# Patient Record
Sex: Female | Born: 1980 | Race: White | Hispanic: No | Marital: Married | Smoking: Former smoker
Health system: Southern US, Community
[De-identification: ages and names within clinical notes are randomized; demographics above are authoritative.]

## PROBLEM LIST (undated history)

## (undated) DIAGNOSIS — F329 Major depressive disorder, single episode, unspecified: Secondary | ICD-10-CM

## (undated) DIAGNOSIS — O1490 Unspecified pre-eclampsia, unspecified trimester: Secondary | ICD-10-CM

## (undated) DIAGNOSIS — G43909 Migraine, unspecified, not intractable, without status migrainosus: Secondary | ICD-10-CM

## (undated) DIAGNOSIS — R569 Unspecified convulsions: Secondary | ICD-10-CM

## (undated) DIAGNOSIS — Z8742 Personal history of other diseases of the female genital tract: Secondary | ICD-10-CM

## (undated) DIAGNOSIS — K219 Gastro-esophageal reflux disease without esophagitis: Secondary | ICD-10-CM

## (undated) DIAGNOSIS — F32A Depression, unspecified: Secondary | ICD-10-CM

## (undated) DIAGNOSIS — F419 Anxiety disorder, unspecified: Secondary | ICD-10-CM

## (undated) DIAGNOSIS — N809 Endometriosis, unspecified: Secondary | ICD-10-CM

## (undated) DIAGNOSIS — E785 Hyperlipidemia, unspecified: Secondary | ICD-10-CM

## (undated) HISTORY — DX: Personal history of other diseases of the female genital tract: Z87.42

## (undated) HISTORY — DX: Major depressive disorder, single episode, unspecified: F32.9

## (undated) HISTORY — DX: Anxiety disorder, unspecified: F41.9

## (undated) HISTORY — PX: HAND SURGERY: SHX662

## (undated) HISTORY — DX: Gastro-esophageal reflux disease without esophagitis: K21.9

## (undated) HISTORY — DX: Depression, unspecified: F32.A

## (undated) HISTORY — PX: OTHER SURGICAL HISTORY: SHX169

## (undated) HISTORY — DX: Unspecified convulsions: R56.9

## (undated) HISTORY — PX: ECTOPIC PREGNANCY SURGERY: SHX613

## (undated) HISTORY — PX: CERVICAL BIOPSY  W/ LOOP ELECTRODE EXCISION: SUR135

## (undated) HISTORY — PX: LEEP: SHX91

## (undated) HISTORY — DX: Hyperlipidemia, unspecified: E78.5

## (undated) HISTORY — DX: Endometriosis, unspecified: N80.9

## (undated) HISTORY — PX: ABDOMINAL HYSTERECTOMY: SHX81

---

## 2015-07-18 DIAGNOSIS — Z22338 Carrier of other streptococcus: Secondary | ICD-10-CM | POA: Diagnosis not present

## 2015-07-18 DIAGNOSIS — G43119 Migraine with aura, intractable, without status migrainosus: Secondary | ICD-10-CM | POA: Diagnosis not present

## 2015-07-18 DIAGNOSIS — J0100 Acute maxillary sinusitis, unspecified: Secondary | ICD-10-CM | POA: Diagnosis not present

## 2015-08-13 DIAGNOSIS — D72829 Elevated white blood cell count, unspecified: Secondary | ICD-10-CM | POA: Diagnosis not present

## 2015-08-13 DIAGNOSIS — R1031 Right lower quadrant pain: Secondary | ICD-10-CM | POA: Diagnosis not present

## 2015-08-13 DIAGNOSIS — R198 Other specified symptoms and signs involving the digestive system and abdomen: Secondary | ICD-10-CM | POA: Diagnosis not present

## 2015-08-13 DIAGNOSIS — R197 Diarrhea, unspecified: Secondary | ICD-10-CM | POA: Diagnosis not present

## 2015-08-27 DIAGNOSIS — G5632 Lesion of radial nerve, left upper limb: Secondary | ICD-10-CM | POA: Diagnosis not present

## 2015-08-27 DIAGNOSIS — M7712 Lateral epicondylitis, left elbow: Secondary | ICD-10-CM | POA: Diagnosis not present

## 2015-08-30 DIAGNOSIS — Z9889 Other specified postprocedural states: Secondary | ICD-10-CM | POA: Diagnosis not present

## 2015-08-30 DIAGNOSIS — Z01419 Encounter for gynecological examination (general) (routine) without abnormal findings: Secondary | ICD-10-CM | POA: Diagnosis not present

## 2015-08-30 DIAGNOSIS — Z1151 Encounter for screening for human papillomavirus (HPV): Secondary | ICD-10-CM | POA: Diagnosis not present

## 2015-08-30 DIAGNOSIS — N939 Abnormal uterine and vaginal bleeding, unspecified: Secondary | ICD-10-CM | POA: Diagnosis not present

## 2015-09-05 DIAGNOSIS — M7712 Lateral epicondylitis, left elbow: Secondary | ICD-10-CM | POA: Diagnosis not present

## 2015-09-05 DIAGNOSIS — G5632 Lesion of radial nerve, left upper limb: Secondary | ICD-10-CM | POA: Diagnosis not present

## 2016-02-04 DIAGNOSIS — Z23 Encounter for immunization: Secondary | ICD-10-CM | POA: Diagnosis not present

## 2016-02-04 DIAGNOSIS — F329 Major depressive disorder, single episode, unspecified: Secondary | ICD-10-CM | POA: Diagnosis not present

## 2016-02-04 DIAGNOSIS — G43709 Chronic migraine without aura, not intractable, without status migrainosus: Secondary | ICD-10-CM | POA: Diagnosis not present

## 2016-02-04 DIAGNOSIS — F411 Generalized anxiety disorder: Secondary | ICD-10-CM | POA: Diagnosis not present

## 2016-02-04 DIAGNOSIS — F32A Depression, unspecified: Secondary | ICD-10-CM | POA: Insufficient documentation

## 2016-02-04 DIAGNOSIS — E559 Vitamin D deficiency, unspecified: Secondary | ICD-10-CM | POA: Insufficient documentation

## 2016-03-25 DIAGNOSIS — R8290 Unspecified abnormal findings in urine: Secondary | ICD-10-CM | POA: Diagnosis not present

## 2016-04-19 DIAGNOSIS — J111 Influenza due to unidentified influenza virus with other respiratory manifestations: Secondary | ICD-10-CM | POA: Diagnosis not present

## 2016-05-12 ENCOUNTER — Emergency Department: Payer: BLUE CROSS/BLUE SHIELD

## 2016-05-12 ENCOUNTER — Encounter: Payer: Self-pay | Admitting: Emergency Medicine

## 2016-05-12 ENCOUNTER — Emergency Department
Admission: EM | Admit: 2016-05-12 | Discharge: 2016-05-12 | Disposition: A | Discharge status: Home / Self Care | Payer: BLUE CROSS/BLUE SHIELD | Attending: Emergency Medicine | Admitting: Emergency Medicine

## 2016-05-12 DIAGNOSIS — J181 Lobar pneumonia, unspecified organism: Secondary | ICD-10-CM | POA: Diagnosis not present

## 2016-05-12 DIAGNOSIS — R05 Cough: Secondary | ICD-10-CM | POA: Diagnosis not present

## 2016-05-12 DIAGNOSIS — R0602 Shortness of breath: Secondary | ICD-10-CM | POA: Diagnosis not present

## 2016-05-12 DIAGNOSIS — J189 Pneumonia, unspecified organism: Secondary | ICD-10-CM

## 2016-05-12 MED ORDER — ACETAMINOPHEN 325 MG PO TABS
650.0000 mg | ORAL_TABLET | Freq: Once | ORAL | Status: AC
  Administered 2016-05-12: 650 mg via ORAL

## 2016-05-12 MED ORDER — BENZONATATE 100 MG PO CAPS
200.0000 mg | ORAL_CAPSULE | Freq: Once | ORAL | Status: AC
  Administered 2016-05-12: 200 mg via ORAL
  Filled 2016-05-12: qty 2

## 2016-05-12 MED ORDER — HYDROCOD POLST-CPM POLST ER 10-8 MG/5ML PO SUER: 5.0000 mL | Freq: Two times a day (BID) | ORAL | 0 refills | Status: DC

## 2016-05-12 MED ORDER — SULFAMETHOXAZOLE-TRIMETHOPRIM 800-160 MG PO TABS: 1.0000 | ORAL_TABLET | Freq: Two times a day (BID) | ORAL | 0 refills | Status: DC

## 2016-05-12 MED ORDER — METHYLPREDNISOLONE 4 MG PO TBPK: ORAL_TABLET | ORAL | 0 refills | Status: DC

## 2016-05-12 MED ORDER — HYDROMORPHONE HCL 1 MG/ML IJ SOLN
1.0000 mg | Freq: Once | INTRAMUSCULAR | Status: AC
  Administered 2016-05-12: 1 mg via INTRAMUSCULAR
  Filled 2016-05-12: qty 1

## 2016-05-12 MED ORDER — METHYLPREDNISOLONE SODIUM SUCC 125 MG IJ SOLR
125.0000 mg | Freq: Once | INTRAMUSCULAR | Status: AC
  Administered 2016-05-12: 125 mg via INTRAMUSCULAR
  Filled 2016-05-12: qty 2

## 2016-05-12 MED ORDER — BENZONATATE 100 MG PO CAPS
ORAL_CAPSULE | ORAL | Status: AC
  Filled 2016-05-12: qty 1

## 2016-05-12 MED ORDER — ACETAMINOPHEN 325 MG PO TABS
ORAL_TABLET | ORAL | Status: AC
  Administered 2016-05-12: 650 mg via ORAL
  Filled 2016-05-12: qty 2

## 2016-05-12 NOTE — ED Notes (Signed)
Patient reports cough and headache began this AM.

## 2016-05-12 NOTE — ED Provider Notes (Signed)
[facility] Emergency Department Provider Note   ____________________________________________   First MD Initiated Contact with Patient 05/12/16 1404     (approximate)  I have reviewed the triage vital signs and the nursing notes.   HISTORY  Chief Complaint Cough    HPI Caitlyn Harris is a 36 y.o. female patient complaining of cough, shortness of breath, and upper back pain for 1 day. Patient had fever this morning but took ibuprofen. No other palliative measures taken for her complaint. Patient rates the pain as a 10 over 10. Patient describes the pain as "sharps/tight".   History reviewed. No pertinent past medical history.  There are no active problems to display for this patient.   Past Surgical History:  Procedure Laterality Date  . ABDOMINAL HYSTERECTOMY      Prior to Admission medications   Medication Sig Start Date End Date Taking? Authorizing Provider  chlorpheniramine-HYDROcodone (TUSSIONEX PENNKINETIC ER) 10-8 MG/5ML SUER Take 5 mLs by mouth 2 (two) times daily. 05/12/16   Sable Feil, PA-C  methylPREDNISolone (MEDROL DOSEPAK) 4 MG TBPK tablet Take Tapered dose as directed 05/12/16   Sable Feil, PA-C  sulfamethoxazole-trimethoprim (BACTRIM DS,SEPTRA DS) 800-160 MG tablet Take 1 tablet by mouth 2 (two) times daily. 05/12/16   Sable Feil, PA-C    Allergies Patient has no known allergies.  No family history on file.  Social History Social History  Substance Use Topics  . Smoking status: Not on file  . Smokeless tobacco: Not on file  . Alcohol use Not on file    Review of Systems Constitutional: Fever and chills Eyes: No visual changes. ENT: No sore throat. Cardiovascular: Denies chest pain. Respiratory: States shortness of breath and nonproductive cough. Gastrointestinal: No abdominal pain.  No nausea, no vomiting.  No diarrhea.  No constipation. Genitourinary: Negative for dysuria. Musculoskeletal: Negative for  back pain. Skin: Negative for rash. Neurological: Negative for headaches, focal weakness or numbness.    ____________________________________________   PHYSICAL EXAM:  VITAL SIGNS: ED Triage Vitals  Enc Vitals Group     BP 05/12/16 1338 107/70     Pulse Rate 05/12/16 1338 (!) 120     Resp 05/12/16 1338 20     Temp 05/12/16 1338 99.2 F (37.3 C)     Temp Source 05/12/16 1338 Oral     SpO2 05/12/16 1338 96 %     Weight 05/12/16 1339 256 lb (116.1 kg)     Height 05/12/16 1339 6\' 1"  (1.854 m)     Head Circumference --      Peak Flow --      Pain Score 05/12/16 1340 8     Pain Loc --      Pain Edu? --      Excl. in [place]? --     Constitutional: Alert and oriented. Well appearing and in no acute distress. Morbid obesity and febrile. Eyes: Conjunctivae are normal. PERRL. EOMI. Head: Atraumatic. Nose: No congestion/rhinnorhea. Mouth/Throat: Mucous membranes are moist.  Oropharynx non-erythematous. Neck: No stridor.  No cervical spine tenderness to palpation. Hematological/Lymphatic/Immunilogical: No cervical lymphadenopathy. Cardiovascular:Tachycardic. Grossly normal heart sounds.  Good peripheral circulation. Respiratory: Normal respiratory effort.  No retractions. Lungs with left Rhonchi breath sound. Gastrointestinal: Soft and nontender. No distention. No abdominal bruits. No CVA tenderness. Musculoskeletal: No lower extremity tenderness nor edema.  No joint effusions. Neurologic:  Normal speech and language. No gross focal neurologic deficits are appreciated. No gait instability. Skin:  Skin is warm, dry and  intact. No rash noted. Psychiatric: Mood and affect are normal. Speech and behavior are normal.  ____________________________________________   LABS (all labs ordered are listed, but only abnormal results are displayed)  Labs Reviewed - No data to  display ____________________________________________  EKG   ____________________________________________  RADIOLOGY  X-ray findings consistent with a left lower mass consistent with  pneumonia. ____________________________________________   PROCEDURES  Procedure(s) performed: None  Procedures  Critical Care performed: No  ____________________________________________   INITIAL IMPRESSION / ASSESSMENT AND PLAN / ED COURSE  Pertinent labs & imaging results that were available during my care of the patient were reviewed by me and considered in my medical decision making (see chart for details).  Left lower lobe pneumonia. Advised patient that there is  history 0.5 cm masslike lesion in this area having lungs re-x-rayed in 2 weeks. Patient will start a course of antibiotics. Patient given a work note for 2 days.      ____________________________________________   FINAL CLINICAL IMPRESSION(S) / ED DIAGNOSES  Final diagnoses:  Community acquired pneumonia of left lower lobe of lung ([place])      NEW MEDICATIONS STARTED DURING THIS VISIT:  New Prescriptions   CHLORPHENIRAMINE-HYDROCODONE (TUSSIONEX PENNKINETIC ER) 10-8 MG/5ML SUER    Take 5 mLs by mouth 2 (two) times daily.   METHYLPREDNISOLONE (MEDROL DOSEPAK) 4 MG TBPK TABLET    Take Tapered dose as directed   SULFAMETHOXAZOLE-TRIMETHOPRIM (BACTRIM DS,SEPTRA DS) 800-160 MG TABLET    Take 1 tablet by mouth 2 (two) times daily.     Note:  This document was prepared using Dragon voice recognition software and may include unintentional dictation errors.    Sable Feil, PA-C 05/12/16 1445    Earleen Newport, MD 05/12/16 [phone]

## 2016-05-12 NOTE — ED Triage Notes (Signed)
Cough, shortness of breath and back pain. Fever this am, took  Ibuprofen for fever.

## 2016-05-19 DIAGNOSIS — J181 Lobar pneumonia, unspecified organism: Secondary | ICD-10-CM | POA: Diagnosis not present

## 2016-06-30 DIAGNOSIS — G43109 Migraine with aura, not intractable, without status migrainosus: Secondary | ICD-10-CM | POA: Diagnosis not present

## 2016-07-11 DIAGNOSIS — J069 Acute upper respiratory infection, unspecified: Secondary | ICD-10-CM | POA: Diagnosis not present

## 2016-07-14 DIAGNOSIS — H1032 Unspecified acute conjunctivitis, left eye: Secondary | ICD-10-CM | POA: Diagnosis not present

## 2016-07-15 ENCOUNTER — Emergency Department
Admission: EM | Admit: 2016-07-15 | Discharge: 2016-07-15 | Disposition: A | Discharge status: Home / Self Care | Payer: BLUE CROSS/BLUE SHIELD | Attending: Emergency Medicine | Admitting: Emergency Medicine

## 2016-07-15 ENCOUNTER — Encounter: Payer: Self-pay | Admitting: Emergency Medicine

## 2016-07-15 DIAGNOSIS — H9201 Otalgia, right ear: Secondary | ICD-10-CM

## 2016-07-15 DIAGNOSIS — H66001 Acute suppurative otitis media without spontaneous rupture of ear drum, right ear: Secondary | ICD-10-CM | POA: Insufficient documentation

## 2016-07-15 HISTORY — DX: Migraine, unspecified, not intractable, without status migrainosus: G43.909

## 2016-07-15 MED ORDER — TRAMADOL HCL 50 MG PO TABS: 50.0000 mg | ORAL_TABLET | Freq: Four times a day (QID) | ORAL | 0 refills | Status: DC | PRN

## 2016-07-15 MED ORDER — AMOXICILLIN 500 MG PO CAPS
1000.0000 mg | ORAL_CAPSULE | Freq: Once | ORAL | Status: AC
  Administered 2016-07-15: 1000 mg via ORAL
  Filled 2016-07-15: qty 2

## 2016-07-15 MED ORDER — AMOXICILLIN 875 MG PO TABS: 875.0000 mg | ORAL_TABLET | Freq: Two times a day (BID) | ORAL | 0 refills | Status: AC

## 2016-07-15 MED ORDER — LIDOCAINE HCL (PF) 1 % IJ SOLN
2.0000 mL | Freq: Once | INTRAMUSCULAR | Status: AC
  Administered 2016-07-15: 2 mL
  Filled 2016-07-15: qty 5

## 2016-07-15 NOTE — ED Triage Notes (Signed)
Patient ambulatory to triage with steady gait, without difficulty or distress noted; pt reports recent cold symptoms, now with right earache

## 2016-07-15 NOTE — ED Notes (Signed)
Pt with c/o right ear pain, right throat pain with sore throat and trouble swallowing. Pt stated she is coughing up yellow/brown mucous, pt with hx of sinuses and allergies. Pt has 2 sons at home with ear infections, 1 with bil tubes in his ears. Pt takes zyrtec and flonase at home. Pt states a fever of 100 a couple days ago.

## 2016-07-15 NOTE — Discharge Instructions (Signed)
Please follow up with your primary care physician.

## 2016-07-15 NOTE — ED Provider Notes (Signed)
[facility] Emergency Department Provider Note   ____________________________________________   First MD Initiated Contact with Patient 07/15/16 [phone]     (approximate)  I have reviewed the triage vital signs and the nursing notes.   HISTORY  Chief Complaint Otalgia    HPI JENAVIEVE FREDA is a 36 y.o. female who comes into the hospital today with some right-sided ear pain. The patient has had some congestion and upper respiratory infection over the last few days. She saw her doctor yesterday and was given drops for pinkeye. She reports that around midnight she developed some right-sided ear pain. She's taken ibuprofen and Tylenol as well as use hot and cold compresses without any relief to her pain. The patient reports that the pain is so severe. She did have a fever to 101 a few days ago. She reports that her pain as a 7 out of 10 in intensity. The patient is unsure she's had fevers today because she's taken ibuprofen for her pain. She was seen by her doctor on Friday and given cetirizine and Flonase as well. The patient is here today for evaluation of this pain.   Past Medical History:  Diagnosis Date  . Migraine     There are no active problems to display for this patient.   Past Surgical History:  Procedure Laterality Date  . ABDOMINAL HYSTERECTOMY    . LEEP      Prior to Admission medications   Medication Sig Start Date End Date Taking? Authorizing Provider  amoxicillin (AMOXIL) 875 MG tablet Take 1 tablet (875 mg total) by mouth 2 (two) times daily. 07/15/16 07/25/16  Loney Hering, MD  chlorpheniramine-HYDROcodone (TUSSIONEX PENNKINETIC ER) 10-8 MG/5ML SUER Take 5 mLs by mouth 2 (two) times daily. 05/12/16   Sable Feil, PA-C  methylPREDNISolone (MEDROL DOSEPAK) 4 MG TBPK tablet Take Tapered dose as directed 05/12/16   Sable Feil, PA-C  sulfamethoxazole-trimethoprim (BACTRIM DS,SEPTRA DS) 800-160 MG tablet Take 1 tablet by mouth 2 (two)  times daily. 05/12/16   Sable Feil, PA-C  traMADol (ULTRAM) 50 MG tablet Take 1 tablet (50 mg total) by mouth every 6 (six) hours as needed. 07/15/16   Loney Hering, MD    Allergies Patient has no known allergies.  No family history on file.  Social History Social History  Substance Use Topics  . Smoking status: Never Smoker  . Smokeless tobacco: Never Used  . Alcohol use No    Review of Systems Constitutional: No fever/chills Eyes: No visual changes. ENT: Right ear pain Cardiovascular: Denies chest pain. Respiratory: Denies shortness of breath. Gastrointestinal: No abdominal pain.  No nausea, no vomiting.  No diarrhea.  No constipation. Genitourinary: Negative for dysuria. Musculoskeletal: Negative for back pain. Skin: Negative for rash. Neurological: Negative for headaches, focal weakness or numbness.  10-point ROS otherwise negative.  ____________________________________________   PHYSICAL EXAM:  VITAL SIGNS: ED Triage Vitals  Enc Vitals Group     BP 07/15/16 0405 (!) 131/93     Pulse Rate 07/15/16 0405 85     Resp 07/15/16 0405 20     Temp 07/15/16 0405 97.9 F (36.6 C)     Temp Source 07/15/16 0405 Oral     SpO2 07/15/16 0405 100 %     Weight 07/15/16 0403 250 lb (113.4 kg)     Height 07/15/16 0403 6\' 1"  (1.854 m)     Head Circumference --      Peak Flow --  Pain Score 07/15/16 0403 9     Pain Loc --      Pain Edu? --      Excl. in [place]? --     Constitutional: Alert and oriented. Well appearing and in no acute distress. Ears: Right TM with some erythema and bulging, left TM gray flat and dull. Eyes: Conjunctivae are normal. PERRL. EOMI. Head: Atraumatic. Nose: No congestion/rhinnorhea. Mouth/Throat: Mucous membranes are moist.  Oropharynx non-erythematous. Cardiovascular: Normal rate, regular rhythm. Grossly normal heart sounds.  Good peripheral circulation. Respiratory: Normal respiratory effort.  No retractions. Lungs  CTAB. Gastrointestinal: Soft and nontender. No distention. Positive bowel sounds Musculoskeletal: No lower extremity tenderness nor edema.   Neurologic:  Normal speech and language.  Skin:  Skin is warm, dry and intact. Psychiatric: Mood and affect are normal.   ____________________________________________   LABS (all labs ordered are listed, but only abnormal results are displayed)  Labs Reviewed - No data to display ____________________________________________  EKG  None ____________________________________________  RADIOLOGY  none ____________________________________________   PROCEDURES  Procedure(s) performed: None  Procedures  Critical Care performed: No  ____________________________________________   INITIAL IMPRESSION / ASSESSMENT AND PLAN / ED COURSE  Pertinent labs & imaging results that were available during my care of the patient were reviewed by me and considered in my medical decision making (see chart for details).  This is a 36 year old female who comes into the hospital today with some right-sided ear pain. I checked the patient's ear and it appears that she has an otitis media. I will put some lidocaine in the patient's ear to help with her pain and she'll receive some amoxicillin as well. She'll be discharged to follow back up with her primary care physician.      ____________________________________________   FINAL CLINICAL IMPRESSION(S) / ED DIAGNOSES  Final diagnoses:  Acute suppurative otitis media of right ear without spontaneous rupture of tympanic membrane, recurrence not specified  Right ear pain      NEW MEDICATIONS STARTED DURING THIS VISIT:  New Prescriptions   AMOXICILLIN (AMOXIL) 875 MG TABLET    Take 1 tablet (875 mg total) by mouth 2 (two) times daily.   TRAMADOL (ULTRAM) 50 MG TABLET    Take 1 tablet (50 mg total) by mouth every 6 (six) hours as needed.     Note:  This document was prepared using Dragon voice  recognition software and may include unintentional dictation errors.    Loney Hering, MD 07/15/16 [phone]

## 2016-07-15 NOTE — ED Notes (Signed)
MD at bedside. 

## 2016-07-21 DIAGNOSIS — H6981 Other specified disorders of Eustachian tube, right ear: Secondary | ICD-10-CM | POA: Diagnosis not present

## 2016-07-21 DIAGNOSIS — G43109 Migraine with aura, not intractable, without status migrainosus: Secondary | ICD-10-CM | POA: Diagnosis not present

## 2016-07-21 DIAGNOSIS — R682 Dry mouth, unspecified: Secondary | ICD-10-CM | POA: Diagnosis not present

## 2016-07-28 DIAGNOSIS — Z13220 Encounter for screening for lipoid disorders: Secondary | ICD-10-CM | POA: Diagnosis not present

## 2016-07-28 DIAGNOSIS — Z79899 Other long term (current) drug therapy: Secondary | ICD-10-CM | POA: Diagnosis not present

## 2016-07-28 DIAGNOSIS — Z131 Encounter for screening for diabetes mellitus: Secondary | ICD-10-CM | POA: Diagnosis not present

## 2016-08-04 DIAGNOSIS — E781 Pure hyperglyceridemia: Secondary | ICD-10-CM | POA: Diagnosis not present

## 2016-08-04 DIAGNOSIS — G43709 Chronic migraine without aura, not intractable, without status migrainosus: Secondary | ICD-10-CM | POA: Diagnosis not present

## 2016-08-04 DIAGNOSIS — E669 Obesity, unspecified: Secondary | ICD-10-CM | POA: Diagnosis not present

## 2016-08-04 DIAGNOSIS — Z0000 Encounter for general adult medical examination without abnormal findings: Secondary | ICD-10-CM | POA: Diagnosis not present

## 2016-09-08 DIAGNOSIS — K580 Irritable bowel syndrome with diarrhea: Secondary | ICD-10-CM | POA: Diagnosis not present

## 2016-09-08 DIAGNOSIS — R1084 Generalized abdominal pain: Secondary | ICD-10-CM | POA: Diagnosis not present

## 2016-09-08 DIAGNOSIS — F411 Generalized anxiety disorder: Secondary | ICD-10-CM | POA: Diagnosis not present

## 2016-09-08 DIAGNOSIS — R110 Nausea: Secondary | ICD-10-CM | POA: Diagnosis not present

## 2016-10-27 DIAGNOSIS — E781 Pure hyperglyceridemia: Secondary | ICD-10-CM | POA: Diagnosis not present

## 2016-11-03 DIAGNOSIS — E781 Pure hyperglyceridemia: Secondary | ICD-10-CM | POA: Diagnosis not present

## 2016-11-03 DIAGNOSIS — G43709 Chronic migraine without aura, not intractable, without status migrainosus: Secondary | ICD-10-CM | POA: Diagnosis not present

## 2016-11-03 DIAGNOSIS — K580 Irritable bowel syndrome with diarrhea: Secondary | ICD-10-CM | POA: Diagnosis not present

## 2016-11-03 DIAGNOSIS — F411 Generalized anxiety disorder: Secondary | ICD-10-CM | POA: Diagnosis not present

## 2017-01-14 DIAGNOSIS — Z0289 Encounter for other administrative examinations: Secondary | ICD-10-CM | POA: Diagnosis not present

## 2017-01-14 DIAGNOSIS — R112 Nausea with vomiting, unspecified: Secondary | ICD-10-CM | POA: Diagnosis not present

## 2017-01-14 DIAGNOSIS — R197 Diarrhea, unspecified: Secondary | ICD-10-CM | POA: Diagnosis not present

## 2017-01-21 DIAGNOSIS — R197 Diarrhea, unspecified: Secondary | ICD-10-CM | POA: Diagnosis not present

## 2017-01-21 DIAGNOSIS — Z0289 Encounter for other administrative examinations: Secondary | ICD-10-CM | POA: Diagnosis not present

## 2017-01-21 DIAGNOSIS — R112 Nausea with vomiting, unspecified: Secondary | ICD-10-CM | POA: Diagnosis not present

## 2017-01-21 DIAGNOSIS — Z831 Family history of other infectious and parasitic diseases: Secondary | ICD-10-CM | POA: Diagnosis not present

## 2017-02-18 ENCOUNTER — Emergency Department: Payer: BLUE CROSS/BLUE SHIELD

## 2017-02-18 ENCOUNTER — Encounter: Payer: Self-pay | Admitting: *Deleted

## 2017-02-18 ENCOUNTER — Other Ambulatory Visit: Payer: Self-pay

## 2017-02-18 ENCOUNTER — Emergency Department
Admission: EM | Admit: 2017-02-18 | Discharge: 2017-02-18 | Disposition: A | Discharge status: Home / Self Care | Payer: BLUE CROSS/BLUE SHIELD | Attending: Emergency Medicine | Admitting: Emergency Medicine

## 2017-02-18 DIAGNOSIS — R1030 Lower abdominal pain, unspecified: Secondary | ICD-10-CM | POA: Diagnosis not present

## 2017-02-18 DIAGNOSIS — K429 Umbilical hernia without obstruction or gangrene: Secondary | ICD-10-CM | POA: Insufficient documentation

## 2017-02-18 DIAGNOSIS — R109 Unspecified abdominal pain: Secondary | ICD-10-CM | POA: Diagnosis not present

## 2017-02-18 LAB — BASIC METABOLIC PANEL
Anion gap: 7 (ref 5–15)
BUN: 13 mg/dL (ref 6–20)
CALCIUM: 9.4 mg/dL (ref 8.9–10.3)
CO2: 20 mmol/L — AB (ref 22–32)
Chloride: 108 mmol/L (ref 101–111)
Creatinine, Ser: 0.8 mg/dL (ref 0.44–1.00)
GFR calc Af Amer: >60 mL/min (ref >60)
GLUCOSE: 80 mg/dL (ref 65–99)
Potassium: 3.8 mmol/L (ref 3.5–5.1)
Sodium: 135 mmol/L (ref 135–145)

## 2017-02-18 LAB — CBC WITH DIFFERENTIAL/PLATELET
BASOS PCT: 1 %
Basophils Absolute: 0.1 10*3/uL (ref 0–0.1)
EOS ABS: 0.1 10*3/uL (ref 0–0.7)
Eosinophils Relative: 2 %
HEMATOCRIT: 39.8 % (ref 35.0–47.0)
Hemoglobin: 14 g/dL (ref 12.0–16.0)
Lymphocytes Relative: 26 %
Lymphs Abs: 1.9 10*3/uL (ref 1.0–3.6)
MCH: 31 pg (ref 26.0–34.0)
MCHC: 35.2 g/dL (ref 32.0–36.0)
MCV: 88.1 fL (ref 80.0–100.0)
MONO ABS: 0.5 10*3/uL (ref 0.2–0.9)
MONOS PCT: 7 %
Neutro Abs: 4.7 10*3/uL (ref 1.4–6.5)
Neutrophils Relative %: 64 %
Platelets: 210 10*3/uL (ref 150–440)
RBC: 4.52 MIL/uL (ref 3.80–5.20)
RDW: 12.6 % (ref 11.5–14.5)
WBC: 7.3 10*3/uL (ref 3.6–11.0)

## 2017-02-18 LAB — URINALYSIS, COMPLETE (UACMP) WITH MICROSCOPIC
BILIRUBIN URINE: NEGATIVE
GLUCOSE, UA: NEGATIVE mg/dL
KETONES UR: NEGATIVE mg/dL
Leukocytes, UA: NEGATIVE
Nitrite: NEGATIVE
PROTEIN: NEGATIVE mg/dL
Specific Gravity, Urine: 1.004 — ABNORMAL LOW (ref 1.005–1.030)
pH: 6 (ref 5.0–8.0)

## 2017-02-18 MED ORDER — KETOROLAC TROMETHAMINE 10 MG PO TABS: 10.0000 mg | ORAL_TABLET | Freq: Three times a day (TID) | ORAL | 0 refills | Status: DC

## 2017-02-18 MED ORDER — ONDANSETRON 4 MG PO TBDP: 4.0000 mg | ORAL_TABLET | Freq: Three times a day (TID) | ORAL | 0 refills | Status: DC | PRN

## 2017-02-18 MED ORDER — IOPAMIDOL (ISOVUE-300) INJECTION 61%
100.0000 mL | Freq: Once | INTRAVENOUS | Status: AC | PRN
  Administered 2017-02-18: 100 mL via INTRAVENOUS
  Filled 2017-02-18: qty 100

## 2017-02-18 MED ORDER — SODIUM CHLORIDE 0.9 % IV BOLUS (SEPSIS)
1000.0000 mL | Freq: Once | INTRAVENOUS | Status: AC
Start: 2017-02-18 — End: 2017-02-18
  Administered 2017-02-18: 1000 mL via INTRAVENOUS

## 2017-02-18 MED ORDER — ONDANSETRON HCL 4 MG/2ML IJ SOLN
4.0000 mg | Freq: Once | INTRAMUSCULAR | Status: AC
  Administered 2017-02-18: 4 mg via INTRAVENOUS
  Filled 2017-02-18: qty 2

## 2017-02-18 MED ORDER — KETOROLAC TROMETHAMINE 30 MG/ML IJ SOLN
30.0000 mg | Freq: Once | INTRAMUSCULAR | Status: AC
  Administered 2017-02-18: 30 mg via INTRAVENOUS
  Filled 2017-02-18: qty 1

## 2017-02-18 MED ORDER — CYCLOBENZAPRINE HCL 5 MG PO TABS: 5.0000 mg | ORAL_TABLET | Freq: Three times a day (TID) | ORAL | 0 refills | Status: DC | PRN

## 2017-02-18 MED ORDER — IOPAMIDOL (ISOVUE-300) INJECTION 61%
30.0000 mL | Freq: Once | INTRAVENOUS | Status: AC | PRN
  Administered 2017-02-18: 30 mL via ORAL
  Filled 2017-02-18: qty 30

## 2017-02-18 NOTE — ED Provider Notes (Signed)
[facility] Emergency Department Provider Note ____________________________________________  Time seen: 1022  I have reviewed the triage vital signs and the nursing notes.  HISTORY  Chief Complaint  Abdominal Pain  HPI Caitlyn Harris is a 36 y.o. female sent to the ED for evaluation of left lower abdominal pain.  Patient describes sudden pain, in the form of a "pull" after she coughed forcefully this morning.  She describes the pain as a twisting, sharp, and throbbing pain. She also feels bloated. She denies any chest pain, shortness of breath, nausea, vomiting, or dizziness. She has a history of IBS-D, and is s/p partial hysterectomy with ovary sparing, due to endometriosis. She apparently had a mild rectal/bladder prolapse following that surgery, last year.   Past Medical History:  Diagnosis Date  . Migraine     There are no active problems to display for this patient.   Past Surgical History:  Procedure Laterality Date  . ABDOMINAL HYSTERECTOMY    . LEEP      Prior to Admission medications   Medication Sig Start Date End Date Taking? Authorizing Provider  chlorpheniramine-HYDROcodone (TUSSIONEX PENNKINETIC ER) 10-8 MG/5ML SUER Take 5 mLs by mouth 2 (two) times daily. 05/12/16   Sable Feil, PA-C  cyclobenzaprine (FLEXERIL) 5 MG tablet Take 1 tablet (5 mg total) 3 (three) times daily as needed by mouth for muscle spasms. 02/18/17   Karema Tocci, Dannielle Karvonen, PA-C  ketorolac (TORADOL) 10 MG tablet Take 1 tablet (10 mg total) every 8 (eight) hours by mouth. 02/18/17   Dannisha Eckmann, Dannielle Karvonen, PA-C  methylPREDNISolone (MEDROL DOSEPAK) 4 MG TBPK tablet Take Tapered dose as directed 05/12/16   Sable Feil, PA-C  ondansetron (ZOFRAN ODT) 4 MG disintegrating tablet Take 1 tablet (4 mg total) every 8 (eight) hours as needed by mouth. 02/18/17   Bryttney Netzer, Dannielle Karvonen, PA-C  sulfamethoxazole-trimethoprim (BACTRIM DS,SEPTRA DS) 800-160 MG tablet Take 1 tablet  by mouth 2 (two) times daily. 05/12/16   Sable Feil, PA-C  traMADol (ULTRAM) 50 MG tablet Take 1 tablet (50 mg total) by mouth every 6 (six) hours as needed. 07/15/16   Loney Hering, MD    Allergies Patient has no known allergies.  No family history on file.  Social History Social History   Tobacco Use  . Smoking status: Never Smoker  . Smokeless tobacco: Never Used  Substance Use Topics  . Alcohol use: No  . Drug use: Not on file    Review of Systems  Constitutional: Negative for fever. Cardiovascular: Negative for chest pain. Respiratory: Negative for shortness of breath. Gastrointestinal: Positive for lower, left abdominal pain. Denies vomiting and diarrhea. Genitourinary: Negative for dysuria. Musculoskeletal: Negative for back pain. Skin: Negative for rash. Neurological: Negative for headaches, focal weakness or numbness. ____________________________________________  PHYSICAL EXAM:  VITAL SIGNS: ED Triage Vitals  Enc Vitals Group     BP 02/18/17 1003 136/90     Pulse Rate 02/18/17 1003 73     Resp 02/18/17 1003 18     Temp 02/18/17 1003 98.8 F (37.1 C)     Temp Source 02/18/17 1003 Oral     SpO2 02/18/17 1003 98 %     Weight 02/18/17 0954 237 lb (107.5 kg)     Height 02/18/17 0954 6\' 1"  (1.854 m)     Head Circumference --      Peak Flow --      Pain Score 02/18/17 0954 9     Pain  Loc --      Pain Edu? --      Excl. in [place]? --     Constitutional: Alert and oriented. Well appearing and in no distress. Head: Normocephalic and atraumatic. Eyes: Conjunctivae are normal. PERRL. Normal extraocular movements Cardiovascular: Normal rate, regular rhythm. Normal distal pulses. Respiratory: Normal respiratory effort. No wheezes/rales/rhonchi. Gastrointestinal: Soft and mildly diffusely tender to palpation over the lower abdomen. No distention, rebound, guarding, or rigidity. Normoactive bowel sounds.  Musculoskeletal: Nontender with normal range of motion  in all extremities.  Neurologic:  Normal gait without ataxia. Normal speech and language. No gross focal neurologic deficits are appreciated. Skin:  Skin is warm, dry and intact. No rash noted. ____________________________________________   LABS (pertinent positives/negatives)  Labs Reviewed  URINALYSIS, COMPLETE (UACMP) WITH MICROSCOPIC - Abnormal; Notable for the following components:      Result Value   Color, Urine STRAW (*)    APPearance CLEAR (*)    Specific Gravity, Urine 1.004 (*)    Hgb urine dipstick SMALL (*)    Bacteria, UA RARE (*)    Squamous Epithelial / LPF 0-5 (*)    All other components within normal limits  BASIC METABOLIC PANEL - Abnormal; Notable for the following components:   CO2 20 (*)    All other components within normal limits  CBC WITH DIFFERENTIAL/PLATELET  ____________________________________________   RADIOLOGY  CT Abd/Pelvis w/ CM  IMPRESSION: Small fat containing umbilical hernia.  No other focal abnormality is noted.  I, Caitlyn Harris, Dannielle Karvonen, personally viewed and evaluated these images (plain radiographs) as part of my medical decision making, as well as reviewing the written report by the radiologist. ____________________________________________  PROCEDURES  NS 1000 ml IVP Toradol 30 mg IVP Zofran 4 mg IVP ____________________________________________  INITIAL IMPRESSION / ASSESSMENT AND PLAN / ED COURSE  Differential diagnosis includes, but is not limited to, ovarian cyst, ovarian torsion, acute appendicitis, diverticulitis, urinary tract infection/pyelonephritis, endometriosis, bowel obstruction, colitis, renal colic, gastroenteritis, and hernia.  She with ED evaluation of sudden abdominal pain after forceful cough this morning.  Patient exam has been overall benign.  She has had no nausea, vomiting, or anorexia.  She has had stable vital signs throughout her course in the ED.  Her labs overall reassuring and her CT scan however  does reveal an acute appearing umbilical hernia containing fat.  This CT could correlate well with the patient's sudden, sharp, pulling pain.  She has no previous history or knowledge of umbilical hernia.  She reports improvement of her symptoms following IV medication administration.  He is discharged to follow-up with her primary care provider, and consider general surgery referral if symptoms worsen or persist.  A work note is provided for today as requested.  Patient will return to work tomorrow, and monitor her vital mechanics and symptoms.  Return precautions have been reviewed with her.  She will be discharged with prescriptions for Zofran, ketorolac, and Flexeril. ____________________________________________  FINAL CLINICAL IMPRESSION(S) / ED DIAGNOSES  Final diagnoses:  Umbilical hernia without obstruction and without gangrene  Lower abdominal pain      Lakelyn Straus, Dannielle Karvonen, PA-C 02/18/17 1631    Darel Hong, MD 02/19/17 1345

## 2017-02-18 NOTE — ED Triage Notes (Signed)
Pt coughed and felt a sudden pull in her ;left lower abdominal area, pt denies any other symptoms

## 2017-02-18 NOTE — ED Notes (Signed)
Patient is complaining of severe left lower abdominal pain since she coughed this morning.  Patient is talking and laughing with friends during assessment.  Patient reports feeling very overwhelmed because her child at home is sick.

## 2017-02-18 NOTE — Discharge Instructions (Signed)
Your exam, labs, and CT scan are essentially normal. The scan did reveal an acute umbilical hernia. This would explain your sudden abdominal wall pain. Take the prescription meds as directed. Consider using a panty-girdle for support. Follow-up with your provider or general surgery for routine management. Return to the ED immediately, for suddenly worsening symptoms.

## 2017-02-20 ENCOUNTER — Ambulatory Visit: Payer: BLUE CROSS/BLUE SHIELD | Admitting: Family Medicine

## 2017-02-23 ENCOUNTER — Ambulatory Visit (INDEPENDENT_AMBULATORY_CARE_PROVIDER_SITE_OTHER): Payer: BLUE CROSS/BLUE SHIELD | Admitting: Family Medicine

## 2017-02-23 ENCOUNTER — Encounter: Payer: Self-pay | Admitting: Family Medicine

## 2017-02-23 VITALS — BP 102/70 | HR 70 | Temp 97.6°F | Ht 72.25 in | Wt 243.2 lb

## 2017-02-23 DIAGNOSIS — K429 Umbilical hernia without obstruction or gangrene: Secondary | ICD-10-CM

## 2017-02-23 DIAGNOSIS — N8110 Cystocele, unspecified: Secondary | ICD-10-CM | POA: Diagnosis not present

## 2017-02-23 DIAGNOSIS — B354 Tinea corporis: Secondary | ICD-10-CM

## 2017-02-23 DIAGNOSIS — Z7689 Persons encountering health services in other specified circumstances: Secondary | ICD-10-CM

## 2017-02-23 DIAGNOSIS — K623 Rectal prolapse: Secondary | ICD-10-CM | POA: Diagnosis not present

## 2017-02-23 DIAGNOSIS — R21 Rash and other nonspecific skin eruption: Secondary | ICD-10-CM | POA: Diagnosis not present

## 2017-02-23 NOTE — Patient Instructions (Signed)
Over the counter Lotrimin and apply small amount twice a day for up to 14 days  Take over the counter antihistamine and can use saline nasal spray and Afrin type nasal spray (4 days max)  I will put in referrals to dermatology and uro-gynecologist

## 2017-02-23 NOTE — Progress Notes (Signed)
Subjective:    Patient ID: Caitlyn Harris, female    DOB: Sep 09, 1980, 36 y.o.   MRN: [id number]  HPI This is a 36 yo female who presents today to establish care.  If the area.  She works in [employer]. Increased stress. Her father has stage 4 cancer, she has a 27,5 yo. She has missed a lot of work with her health problems.  She was seen two days ago in the ER with an acute umbilical hernia. She has continued, but improved pain. She has an appointment with Dr. Burt Knack (general surgery) 03/05/17.  Has had problems in past with possible bladder and rectal prolapse. She had a hysterectomy 9/17 with some ? Bladder suspension. She feels like it is difficult to pass her bowel movements and she has to press on her abdomen to help with urination. Has had long standing abdominal pain, worse with standing. Worse with dairy, more bloated and gassy over last couple of weeks. Decreased appetite.   Has noticed rash on upper chest and neck, multiple areas of discoloration, newest one looks different.   Past Medical History:  Diagnosis Date  . Anxiety   . Depression   . GERD (gastroesophageal reflux disease)   . Hyperlipidemia   . Migraine   . Seizures ([place])    Past Surgical History:  Procedure Laterality Date  . ABDOMINAL HYSTERECTOMY    . LEEP     No family history on file. Social History   Tobacco Use  . Smoking status: Never Smoker  . Smokeless tobacco: Never Used  Substance Use Topics  . Alcohol use: No  . Drug use: No      Review of Systems Per HPI    Objective:   Physical Exam  Constitutional: She is oriented to person, place, and time. She appears well-developed and well-nourished. No distress.  HENT:  Head: Normocephalic and atraumatic.  Eyes: Conjunctivae are normal.  Cardiovascular: Normal rate, regular rhythm and normal heart sounds.  Pulmonary/Chest: Effort normal and breath sounds normal.  Abdominal: Soft. Bowel sounds are normal. She exhibits no distension. There  is tenderness (umbilical, unable to palpate hernia). There is no rebound and no guarding.  Neurological: She is alert and oriented to person, place, and time.  Skin: Skin is warm and dry. Rash noted. She is not diaphoretic.  She has scattered, round, flat patches on upper chest and back. 1-4 cm in diameter, some are hypopigmented and others are hyperpigmented.  She has different appearing lesion on left upper chest with pale center and distinct border.   Psychiatric: She has a normal mood and affect. Her behavior is normal. Judgment and thought content normal.  Vitals reviewed.     BP 102/70 (BP Location: Left Arm, Patient Position: Sitting, Cuff Size: Normal)   Pulse 70   Temp 97.6 F (36.4 C) (Oral)   Ht 6' 0.25" (1.835 m)   Wt 243 lb 4 oz (110.3 kg)   SpO2 98%   BMI 32.76 kg/m  Wt Readings from Last 3 Encounters:  02/23/17 243 lb 4 oz (110.3 kg)  02/18/17 237 lb (107.5 kg)  07/15/16 250 lb (113.4 kg)       Assessment & Plan:  1. Encounter to establish care - Discussed and encouraged healthy lifestyle choices- adequate sleep, regular exercise, stress management and healthy food choices.  - will obtain and review records  2. Rectal prolapse - Ambulatory referral to Urogynecology  3. Bladder prolapse, female, acquired - Ambulatory referral to Urogynecology  4. Rash of neck - Ambulatory referral to Dermatology  5. Tinea corporis - one of the lesions looks like tinea, provided written and verbal instructions regarding treatment  6. Umbilical hernia without obstruction and without gangrene - reviewed RTC/ER precautions -Follow-up with general surgery as scheduled   Clarene Reamer, FNP-BC  [facility], [place]  02/26/2017 3:15 PM

## 2017-02-26 ENCOUNTER — Encounter: Payer: Self-pay | Admitting: Family Medicine

## 2017-03-05 ENCOUNTER — Ambulatory Visit (INDEPENDENT_AMBULATORY_CARE_PROVIDER_SITE_OTHER): Payer: BLUE CROSS/BLUE SHIELD | Admitting: Surgery

## 2017-03-05 ENCOUNTER — Encounter: Payer: Self-pay | Admitting: Surgery

## 2017-03-05 VITALS — BP 142/93 | HR 102 | Temp 97.5°F | Ht 72.0 in | Wt 240.6 lb

## 2017-03-05 DIAGNOSIS — K439 Ventral hernia without obstruction or gangrene: Secondary | ICD-10-CM

## 2017-03-05 NOTE — Patient Instructions (Signed)
We will schedule your surgery for the week of December 11-14. Caitlyn Harris will call and let you know the exact date within the next 48 hours.  Please see your blue pre-care sheet for surgery information. Please call if you have any questions or concerns.    You have requested for your Umbilical Hernia be repaired. This has been scheduled the week of December 11-14 by Dr. Burt Knack at [facility].  Please see your (blue)pre-care sheet for information.  You will need to arrange to be off work for 1-2 weeks but will have to have a lifting restriction of no more than 15 lbs for 6 weeks following your surgery.   Umbilical Hernia, Adult A hernia is a bulge of tissue that pushes through an opening between muscles. An umbilical hernia happens in the abdomen, near the belly button (umbilicus). The hernia may contain tissues from the small intestine, large intestine, or fatty tissue covering the intestines (omentum). Umbilical hernias in adults tend to get worse over time, and they require surgical treatment. There are several types of umbilical hernias. You may have:  A hernia located just above or below the umbilicus (indirect hernia). This is the most common type of umbilical hernia in adults.  A hernia that forms through an opening formed by the umbilicus (direct hernia).  A hernia that comes and goes (reducible hernia). A reducible hernia may be visible only when you strain, lift something heavy, or cough. This type of hernia can be pushed back into the abdomen (reduced).  A hernia that traps abdominal tissue inside the hernia (incarcerated hernia). This type of hernia cannot be reduced.  A hernia that cuts off blood flow to the tissues inside the hernia (strangulated hernia). The tissues can start to die if this happens. This type of hernia requires emergency treatment.  What are the causes? An umbilical hernia happens when tissue inside the abdomen presses on a weak area of the abdominal  muscles. What increases the risk? You may have a greater risk of this condition if you:  Are obese.  Have had several pregnancies.  Have a buildup of fluid inside your abdomen (ascites).  Have had surgery that weakens the abdominal muscles.  What are the signs or symptoms? The main symptom of this condition is a painless bulge at or near the belly button. A reducible hernia may be visible only when you strain, lift something heavy, or cough. Other symptoms may include:  Dull pain.  A feeling of pressure.  Symptoms of a strangulated hernia may include:  Pain that gets increasingly worse.  Nausea and vomiting.  Pain when pressing on the hernia.  Skin over the hernia becoming red or purple.  Constipation.  Blood in the stool.  How is this diagnosed? This condition may be diagnosed based on:  A physical exam. You may be asked to cough or strain while standing. These actions increase the pressure inside your abdomen and force the hernia through the opening in your muscles. Your health care provider may try to reduce the hernia by pressing on it.  Your symptoms and medical history.  How is this treated? Surgery is the only treatment for an umbilical hernia. Surgery for a strangulated hernia is done as soon as possible. If you have a small hernia that is not incarcerated, you may need to lose weight before having surgery. Follow these instructions at home:  Lose weight, if told by your health care provider.  Do not try to push the hernia back  in.  Watch your hernia for any changes in color or size. Tell your health care provider if any changes occur.  You may need to avoid activities that increase pressure on your hernia.  Do not lift anything that is heavier than 10 lb (4.5 kg) until your health care provider says that this is safe.  Take over-the-counter and prescription medicines only as told by your health care provider.  Keep all follow-up visits as told by your  health care provider. This is important. Contact a health care provider if:  Your hernia gets larger.  Your hernia becomes painful. Get help right away if:  You develop sudden, severe pain near the area of your hernia.  You have pain as well as nausea or vomiting.  You have pain and the skin over your hernia changes color.  You develop a fever. This information is not intended to replace advice given to you by your health care provider. Make sure you discuss any questions you have with your health care provider. Document Released: 08/24/2015 Document Revised: 11/25/2015 Document Reviewed: 08/24/2015 Elsevier Interactive Patient Education  [employer].

## 2017-03-05 NOTE — Progress Notes (Signed)
Caitlyn Harris is an 36 y.o. female.   Chief Complaint: [facility] HPI: This patient referred for an umbilical hernia.  Patient has been referred by the emergency room physician.  Patient was taken to the emergency room with the acute onset of pain that caused her to fall the ground and a workup showed an incarcerated umbilical hernia with fat present.  Post ER visit she has continued to have minor pain but nothing like she had had that day.  She has been able to work but her schedule is been somewhat truncated.  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 She had no nausea vomiting fevers or chills with this.  She has had multiple abdominal procedures including laparoscopy and robotic assisted vaginal hysterectomy and she has a port site in the exact same area.  She cannot feel or see a mass.  She works in [employer] and does not smoke or drink   Past Medical History:  Diagnosis Date  . Anxiety   . Depression   . GERD (gastroesophageal reflux disease)   . Hyperlipidemia   . Migraine   . Seizures ([place])     Past Surgical History:  Procedure Laterality Date  . ABDOMINAL HYSTERECTOMY    . LEEP      No family history on file. Social History:  reports that  has never smoked. she has never used smokeless tobacco. She reports that she does not drink alcohol or use drugs.  Allergies: No Known Allergies   (Not in a hospital admission)   Review of Systems:   Review of Systems  Constitutional: Negative.   HENT: Negative.   Eyes: Negative.   Respiratory: Negative.   Cardiovascular: Negative.   Gastrointestinal: Positive for abdominal pain. Negative for blood in stool, constipation, diarrhea, heartburn, nausea and vomiting.  Genitourinary: Negative.   Musculoskeletal: Negative.   Skin: Negative.   Neurological: Negative.   Endo/Heme/Allergies: Negative.   Psychiatric/Behavioral: Negative.     Physical Exam:  Physical Exam  Constitutional: She is oriented to person, place, and time. No  distress.  HENT:  Head: Normocephalic and atraumatic.  Eyes: Pupils are equal, round, and reactive to light. Right eye exhibits no discharge. Left eye exhibits no discharge. No scleral icterus.  Neck: Normal range of motion. Neck supple.  Cardiovascular: Normal rate, regular rhythm and normal heart sounds.  Pulmonary/Chest: Effort normal and breath sounds normal. No respiratory distress. She has no wheezes. She has no rales.  Abdominal: Soft. She exhibits no distension. There is no tenderness. There is no rebound and no guarding.  Multiple keloid appearing laparoscopy scars.  1 of those scars is in the supraumbilical area where palpation suggests the presence of a hernia but this is difficult due to her obesity.  It is relatively nontender  Musculoskeletal: Normal range of motion. She exhibits no edema or tenderness.  Lymphadenopathy:    She has no cervical adenopathy.  Neurological: She is alert and oriented to person, place, and time.  Skin: Skin is warm and dry. No rash noted. She is not diaphoretic. No erythema.  Psychiatric: Mood and affect normal.  Vitals reviewed.   There were no vitals taken for this visit.    No results found for this or any previous visit (from the past 48 hour(s)). No results found.   Assessment/Plan CT scan is personally reviewed showing a small umbilical hernia.  There is fat within it.  No sign of incarcerated bowel.  Labs reviewed as well.  This patient with an  umbilical hernia causing pain.  I have recommended surgical intervention and this may require mesh.  This likely represents a ventral hernia as opposed to an umbilical as she has had procedures through this area.  She may require mesh placement but it is quite small and primary repair could be a possibility depending on the intraoperative findings.  This was discussed with the patient the options of observation reviewed and the risks of bleeding infection recurrent mesh placement mesh infection and  cosmetic deformity were all discussed.  She understood and agreed to proceed. Florene Glen, MD, FACS

## 2017-03-06 DIAGNOSIS — B360 Pityriasis versicolor: Secondary | ICD-10-CM | POA: Diagnosis not present

## 2017-03-06 DIAGNOSIS — L918 Other hypertrophic disorders of the skin: Secondary | ICD-10-CM | POA: Diagnosis not present

## 2017-03-09 ENCOUNTER — Telehealth: Payer: Self-pay | Admitting: Surgery

## 2017-03-09 DIAGNOSIS — N9489 Other specified conditions associated with female genital organs and menstrual cycle: Secondary | ICD-10-CM | POA: Diagnosis not present

## 2017-03-09 DIAGNOSIS — K581 Irritable bowel syndrome with constipation: Secondary | ICD-10-CM | POA: Diagnosis not present

## 2017-03-09 NOTE — Telephone Encounter (Signed)
Pt advised of pre op date/time and sx date. Sx: 03/19/17 with Dr Cooper--Open ventral hernia repair-possible mesh.  Pre op: 03/12/17 between 1-5:00pm--phone interview.   Patient made aware to call [phone], between 1-3:00pm the day before surgery, to find out what time to arrive.

## 2017-03-09 NOTE — Telephone Encounter (Signed)
Left a message for the patient to call the office, we received fmla paperwork, left message letting patient know we needed to collect a 20.00 fee for paperwork.

## 2017-03-09 NOTE — Telephone Encounter (Signed)
Patient called back payment has been paid and placed in folder.

## 2017-03-12 ENCOUNTER — Encounter
Admission: RE | Admit: 2017-03-12 | Discharge: 2017-03-12 | Disposition: A | Discharge status: Home / Self Care | Payer: BLUE CROSS/BLUE SHIELD | Source: Ambulatory Visit | Attending: Surgery | Admitting: Surgery

## 2017-03-12 ENCOUNTER — Other Ambulatory Visit: Payer: Self-pay

## 2017-03-12 HISTORY — DX: Unspecified pre-eclampsia, unspecified trimester: O14.90

## 2017-03-12 NOTE — Patient Instructions (Signed)
  Your procedure is scheduled on: 03-19-17  Report to Same Day Surgery 2nd floor medical mall [facility] Entrance-take elevator on left to 2nd floor.  Check in with surgery information desk.) To find out your arrival time please call [phone] between 1PM - 3PM on 03-18-17  Remember: Instructions that are not followed completely may result in serious medical risk, up to and including death, or upon the discretion of your surgeon and anesthesiologist your surgery may need to be rescheduled.    _x___ 1. Do not eat food after midnight the night before your procedure. You may drink clear liquids up to 2 hours before you are scheduled to arrive at the hospital for your procedure.  Do not drink clear liquids within 2 hours of your scheduled arrival to the hospital.  Clear liquids include  --Water or Apple juice without pulp  --Clear carbohydrate beverage such as ClearFast or Gatorade  --Black Coffee or Clear Tea (No milk, no creamers, do not add anything to the coffee or Tea Type 1 and type 2 diabetics should only drink water.  No gum chewing or hard candies.     __x__ 2. No Alcohol for 24 hours before or after surgery.   __x__3. No Smoking for 24 prior to surgery.   ____  4. Bring all medications with you on the day of surgery if instructed.    __x__ 5. Notify your doctor if there is any change in your medical condition     (cold, fever, infections).     Do not wear jewelry, make-up, hairpins, clips or nail polish.  Do not wear lotions, powders, or perfumes. You may wear deodorant.  Do not shave 48 hours prior to surgery. Men may shave face and neck.  Do not bring valuables to the hospital.    [facility] is not responsible for any belongings or valuables.               Contacts, dentures or bridgework may not be worn into surgery.  Leave your suitcase in the car. After surgery it may be brought to your room.  For patients admitted to the hospital, discharge time is determined by  your treatment team.   Patients discharged the day of surgery will not be allowed to drive home.  You will need someone to drive you home and stay with you the night of your procedure.      _x___ TAKE THE FOLLOWING MEDICATION THE MORNING OF SURGERY WITH A SMALL SIP OF WATER.   These include:  1. TOPAMAX  2. PRILOSEC  3. TAKE A PRILOSEC Wednesday NIGHT BEFORE BED  4.  5.  6.  ____Fleets enema or Magnesium Citrate as directed.   _x___ Use CHG Soap or sage wipes as directed on instruction sheet   ____ Use inhalers on the day of surgery and bring to hospital day of surgery  ____ Stop Metformin and Janumet 2 days prior to surgery.    ____ Take 1/2 of usual insulin dose the night before surgery and none on the morning  surgery.   ____ Follow recommendations from Cardiologist, Pulmonologist or PCP regarding stopping Aspirin, Coumadin, Plavix ,Eliquis, Effient, or Pradaxa, and Pletal.  X____Stop Anti-inflammatories such as Advil, Aleve, Ibuprofen, Motrin, Naproxen, Naprosyn, Goodies powders or aspirin products NOW-OK to take Tylenol    ____ Stop supplements until after surgery.    ____ Bring C-Pap to the hospital.

## 2017-03-16 ENCOUNTER — Ambulatory Visit: Payer: Self-pay

## 2017-03-18 MED ORDER — CEFAZOLIN SODIUM-DEXTROSE 2-4 GM/100ML-% IV SOLN
2.0000 g | INTRAVENOUS | Status: AC
  Administered 2017-03-19: 2 g via INTRAVENOUS

## 2017-03-19 ENCOUNTER — Ambulatory Visit: Payer: BLUE CROSS/BLUE SHIELD | Admitting: Certified Registered"

## 2017-03-19 ENCOUNTER — Ambulatory Visit
Admission: RE | Admit: 2017-03-19 | Discharge: 2017-03-19 | Disposition: A | Discharge status: Home / Self Care | Payer: BLUE CROSS/BLUE SHIELD | Source: Ambulatory Visit | Attending: Surgery | Admitting: Surgery

## 2017-03-19 ENCOUNTER — Encounter
Admission: RE | Disposition: A | Discharge status: Home / Self Care | Payer: Self-pay | Source: Ambulatory Visit | Attending: Surgery

## 2017-03-19 ENCOUNTER — Encounter: Payer: Self-pay | Admitting: *Deleted

## 2017-03-19 DIAGNOSIS — K429 Umbilical hernia without obstruction or gangrene: Secondary | ICD-10-CM | POA: Diagnosis not present

## 2017-03-19 DIAGNOSIS — G43909 Migraine, unspecified, not intractable, without status migrainosus: Secondary | ICD-10-CM | POA: Insufficient documentation

## 2017-03-19 DIAGNOSIS — Z79899 Other long term (current) drug therapy: Secondary | ICD-10-CM | POA: Insufficient documentation

## 2017-03-19 DIAGNOSIS — F419 Anxiety disorder, unspecified: Secondary | ICD-10-CM | POA: Diagnosis not present

## 2017-03-19 DIAGNOSIS — Z87891 Personal history of nicotine dependence: Secondary | ICD-10-CM | POA: Insufficient documentation

## 2017-03-19 DIAGNOSIS — E785 Hyperlipidemia, unspecified: Secondary | ICD-10-CM | POA: Insufficient documentation

## 2017-03-19 DIAGNOSIS — Z90710 Acquired absence of both cervix and uterus: Secondary | ICD-10-CM | POA: Diagnosis not present

## 2017-03-19 DIAGNOSIS — K420 Umbilical hernia with obstruction, without gangrene: Secondary | ICD-10-CM | POA: Diagnosis not present

## 2017-03-19 DIAGNOSIS — F329 Major depressive disorder, single episode, unspecified: Secondary | ICD-10-CM | POA: Diagnosis not present

## 2017-03-19 DIAGNOSIS — K439 Ventral hernia without obstruction or gangrene: Secondary | ICD-10-CM | POA: Diagnosis not present

## 2017-03-19 DIAGNOSIS — K219 Gastro-esophageal reflux disease without esophagitis: Secondary | ICD-10-CM | POA: Diagnosis not present

## 2017-03-19 HISTORY — PX: VENTRAL HERNIA REPAIR: SHX424

## 2017-03-19 SURGERY — REPAIR, HERNIA, VENTRAL
Anesthesia: General | Site: Abdomen | Wound class: Clean

## 2017-03-19 MED ORDER — FENTANYL CITRATE (PF) 100 MCG/2ML IJ SOLN
INTRAMUSCULAR | Status: DC | PRN
  Administered 2017-03-19: 100 ug via INTRAVENOUS
  Administered 2017-03-19: 50 ug via INTRAVENOUS

## 2017-03-19 MED ORDER — ACETAMINOPHEN 10 MG/ML IV SOLN
INTRAVENOUS | Status: AC
  Filled 2017-03-19: qty 100

## 2017-03-19 MED ORDER — LIDOCAINE HCL (CARDIAC) 20 MG/ML IV SOLN
INTRAVENOUS | Status: DC | PRN
  Administered 2017-03-19: 100 mg via INTRAVENOUS

## 2017-03-19 MED ORDER — OXYCODONE HCL 5 MG PO TABS
ORAL_TABLET | ORAL | Status: AC
  Administered 2017-03-19: 5 mg via ORAL
  Filled 2017-03-19: qty 1

## 2017-03-19 MED ORDER — SUCCINYLCHOLINE CHLORIDE 20 MG/ML IJ SOLN
INTRAMUSCULAR | Status: DC | PRN
  Administered 2017-03-19: 100 mg via INTRAVENOUS

## 2017-03-19 MED ORDER — CHLORHEXIDINE GLUCONATE CLOTH 2 % EX PADS: 6.0000 | MEDICATED_PAD | Freq: Once | CUTANEOUS | Status: DC

## 2017-03-19 MED ORDER — DEXAMETHASONE SODIUM PHOSPHATE 10 MG/ML IJ SOLN
INTRAMUSCULAR | Status: AC
  Filled 2017-03-19: qty 1

## 2017-03-19 MED ORDER — ROCURONIUM BROMIDE 50 MG/5ML IV SOLN
INTRAVENOUS | Status: AC
  Filled 2017-03-19: qty 1

## 2017-03-19 MED ORDER — SUGAMMADEX SODIUM 200 MG/2ML IV SOLN
INTRAVENOUS | Status: AC
  Filled 2017-03-19: qty 2

## 2017-03-19 MED ORDER — HEPARIN SODIUM (PORCINE) 5000 UNIT/ML IJ SOLN
INTRAMUSCULAR | Status: AC
Start: 2017-03-19 — End: 2017-03-19
  Administered 2017-03-19: 5000 [IU] via SUBCUTANEOUS
  Filled 2017-03-19: qty 1

## 2017-03-19 MED ORDER — OXYCODONE HCL 5 MG/5ML PO SOLN: 5.0000 mg | Freq: Once | ORAL | Status: AC | PRN

## 2017-03-19 MED ORDER — LACTATED RINGERS IV SOLN
INTRAVENOUS | Status: DC
  Administered 2017-03-19: 09:00:00 via INTRAVENOUS

## 2017-03-19 MED ORDER — ONDANSETRON HCL 4 MG/2ML IJ SOLN
INTRAMUSCULAR | Status: DC | PRN
Start: 2017-03-19 — End: 2017-03-19
  Administered 2017-03-19: 4 mg via INTRAVENOUS

## 2017-03-19 MED ORDER — SUGAMMADEX SODIUM 200 MG/2ML IV SOLN
INTRAVENOUS | Status: DC | PRN
  Administered 2017-03-19: 200 mg via INTRAVENOUS

## 2017-03-19 MED ORDER — MEPERIDINE HCL 50 MG/ML IJ SOLN: 6.2500 mg | INTRAMUSCULAR | Status: DC | PRN

## 2017-03-19 MED ORDER — PROPOFOL 10 MG/ML IV BOLUS
INTRAVENOUS | Status: AC
  Filled 2017-03-19: qty 20

## 2017-03-19 MED ORDER — MIDAZOLAM HCL 2 MG/2ML IJ SOLN
INTRAMUSCULAR | Status: DC | PRN
  Administered 2017-03-19: 2 mg via INTRAVENOUS

## 2017-03-19 MED ORDER — HEPARIN SODIUM (PORCINE) 5000 UNIT/ML IJ SOLN
5000.0000 [IU] | Freq: Once | INTRAMUSCULAR | Status: AC
  Administered 2017-03-19: 5000 [IU] via SUBCUTANEOUS

## 2017-03-19 MED ORDER — FENTANYL CITRATE (PF) 100 MCG/2ML IJ SOLN
25.0000 ug | INTRAMUSCULAR | Status: DC | PRN
  Administered 2017-03-19 (×2): 25 ug via INTRAVENOUS
  Administered 2017-03-19 (×2): 50 ug via INTRAVENOUS

## 2017-03-19 MED ORDER — CEFAZOLIN SODIUM-DEXTROSE 2-4 GM/100ML-% IV SOLN
INTRAVENOUS | Status: AC
  Filled 2017-03-19: qty 100

## 2017-03-19 MED ORDER — PROPOFOL 10 MG/ML IV BOLUS
INTRAVENOUS | Status: DC | PRN
  Administered 2017-03-19: 160 mg via INTRAVENOUS

## 2017-03-19 MED ORDER — ACETAMINOPHEN 10 MG/ML IV SOLN
INTRAVENOUS | Status: DC | PRN
  Administered 2017-03-19: 1000 mg via INTRAVENOUS

## 2017-03-19 MED ORDER — ONDANSETRON HCL 4 MG/2ML IJ SOLN
INTRAMUSCULAR | Status: AC
  Filled 2017-03-19: qty 2

## 2017-03-19 MED ORDER — CHLORHEXIDINE GLUCONATE CLOTH 2 % EX PADS
6.0000 | MEDICATED_PAD | Freq: Once | CUTANEOUS | Status: AC
  Administered 2017-03-19: 6 via TOPICAL

## 2017-03-19 MED ORDER — SUCCINYLCHOLINE CHLORIDE 20 MG/ML IJ SOLN
INTRAMUSCULAR | Status: AC
Start: 2017-03-19 — End: 2017-03-19
  Filled 2017-03-19: qty 1

## 2017-03-19 MED ORDER — PROMETHAZINE HCL 25 MG/ML IJ SOLN: 6.2500 mg | INTRAMUSCULAR | Status: DC | PRN

## 2017-03-19 MED ORDER — KETOROLAC TROMETHAMINE 30 MG/ML IJ SOLN
INTRAMUSCULAR | Status: DC | PRN
  Administered 2017-03-19: 30 mg via INTRAVENOUS

## 2017-03-19 MED ORDER — FENTANYL CITRATE (PF) 100 MCG/2ML IJ SOLN
INTRAMUSCULAR | Status: AC
  Filled 2017-03-19: qty 2

## 2017-03-19 MED ORDER — LIDOCAINE HCL (PF) 2 % IJ SOLN
INTRAMUSCULAR | Status: AC
  Filled 2017-03-19: qty 10

## 2017-03-19 MED ORDER — ROCURONIUM BROMIDE 100 MG/10ML IV SOLN
INTRAVENOUS | Status: DC | PRN
  Administered 2017-03-19: 30 mg via INTRAVENOUS
  Administered 2017-03-19: 5 mg via INTRAVENOUS
  Administered 2017-03-19: 10 mg via INTRAVENOUS

## 2017-03-19 MED ORDER — KETOROLAC TROMETHAMINE 30 MG/ML IJ SOLN
INTRAMUSCULAR | Status: AC
  Filled 2017-03-19: qty 1

## 2017-03-19 MED ORDER — OXYCODONE HCL 5 MG PO TABS: 5.0000 mg | ORAL_TABLET | Freq: Once | ORAL | 0 refills | Status: DC | PRN

## 2017-03-19 MED ORDER — FENTANYL CITRATE (PF) 100 MCG/2ML IJ SOLN
INTRAMUSCULAR | Status: AC
  Administered 2017-03-19: 25 ug via INTRAVENOUS
  Filled 2017-03-19: qty 2

## 2017-03-19 MED ORDER — MIDAZOLAM HCL 2 MG/2ML IJ SOLN
INTRAMUSCULAR | Status: AC
  Filled 2017-03-19: qty 2

## 2017-03-19 MED ORDER — FENTANYL CITRATE (PF) 100 MCG/2ML IJ SOLN
INTRAMUSCULAR | Status: AC
  Administered 2017-03-19: 50 ug via INTRAVENOUS
  Filled 2017-03-19: qty 2

## 2017-03-19 MED ORDER — BUPIVACAINE-EPINEPHRINE (PF) 0.25% -1:200000 IJ SOLN
INTRAMUSCULAR | Status: DC | PRN
  Administered 2017-03-19: 30 mL

## 2017-03-19 MED ORDER — DEXAMETHASONE SODIUM PHOSPHATE 10 MG/ML IJ SOLN
INTRAMUSCULAR | Status: DC | PRN
  Administered 2017-03-19: 8 mg via INTRAVENOUS

## 2017-03-19 MED ORDER — OXYCODONE HCL 5 MG PO TABS
5.0000 mg | ORAL_TABLET | Freq: Once | ORAL | Status: AC | PRN
  Administered 2017-03-19: 5 mg via ORAL

## 2017-03-19 SURGICAL SUPPLY — 28 items
ADHESIVE MASTISOL STRL (MISCELLANEOUS) ×2 IMPLANT
CANISTER SUCT 1200ML W/VALVE (MISCELLANEOUS) ×2 IMPLANT
CHLORAPREP W/TINT 26ML (MISCELLANEOUS) ×2 IMPLANT
DRAPE LAPAROTOMY 100X77 ABD (DRAPES) ×2 IMPLANT
ELECT REM PT RETURN 9FT ADLT (ELECTROSURGICAL) ×2
ELECTRODE REM PT RTRN 9FT ADLT (ELECTROSURGICAL) ×1 IMPLANT
ETHIBOND 2 0 GREEN CT 2 30IN (SUTURE) ×2 IMPLANT
GAUZE SPONGE 4X4 12PLY STRL (GAUZE/BANDAGES/DRESSINGS) ×2 IMPLANT
GLOVE BIO SURGEON STRL SZ8 (GLOVE) ×2 IMPLANT
GOWN STRL REUS W/ TWL LRG LVL3 (GOWN DISPOSABLE) ×2 IMPLANT
GOWN STRL REUS W/TWL LRG LVL3 (GOWN DISPOSABLE) ×2
KIT RM TURNOVER STRD PROC AR (KITS) ×2 IMPLANT
LABEL OR SOLS (LABEL) ×2 IMPLANT
NEEDLE HYPO 22GX1.5 SAFETY (NEEDLE) ×2 IMPLANT
NS IRRIG 500ML POUR BTL (IV SOLUTION) ×2 IMPLANT
PACK BASIN MINOR ARMC (MISCELLANEOUS) ×2 IMPLANT
SPONGE LAP 18X18 5 PK (GAUZE/BANDAGES/DRESSINGS) ×2 IMPLANT
STAPLER SKIN PROX 35W (STAPLE) IMPLANT
STRIP CLOSURE SKIN 1/2X4 (GAUZE/BANDAGES/DRESSINGS) ×2 IMPLANT
SUT ETHIBOND CT1 BRD #0 30IN (SUTURE) ×4 IMPLANT
SUT MNCRL 4-0 (SUTURE) ×1
SUT MNCRL 4-0 27XMFL (SUTURE) ×1
SUT PROLENE 0 CT 1 30 (SUTURE) ×4 IMPLANT
SUT VIC AB 0 CT2 27 (SUTURE) ×2 IMPLANT
SUT VIC AB 3-0 SH 27 (SUTURE) ×1
SUT VIC AB 3-0 SH 27X BRD (SUTURE) ×1 IMPLANT
SUTURE MNCRL 4-0 27XMF (SUTURE) ×1 IMPLANT
SYR 10ML LL (SYRINGE) ×2 IMPLANT

## 2017-03-19 NOTE — Anesthesia Preprocedure Evaluation (Signed)
Anesthesia Evaluation  Patient identified by MRN, date of birth, ID band Patient awake    Reviewed: Allergy & Precautions, NPO status , Patient's Chart, lab work & pertinent test results  History of Anesthesia Complications Negative for: history of anesthetic complications  Airway Mallampati: I  TM Distance: >3 FB Neck ROM: Full    Dental no notable dental hx.    Pulmonary neg sleep apnea, neg COPD, former smoker,    breath sounds clear to auscultation- rhonchi (-) wheezing      Cardiovascular (-) hypertension(-) CAD, (-) Past MI and (-) Cardiac Stents  Rhythm:Regular Rate:Normal - Systolic murmurs and - Diastolic murmurs    Neuro/Psych  Headaches, PSYCHIATRIC DISORDERS Anxiety Depression    GI/Hepatic Neg liver ROS, GERD  ,  Endo/Other  negative endocrine ROSneg diabetes  Renal/GU negative Renal ROS     Musculoskeletal negative musculoskeletal ROS (+)   Abdominal (+) - obese,   Peds  Hematology negative hematology ROS (+)   Anesthesia Other Findings Past Medical History: No date: Anxiety No date: Depression No date: GERD (gastroesophageal reflux disease)     Comment:  OCC-  No date: Hyperlipidemia No date: Migraine No date: Pre-eclampsia No date: Seizures ([facility])     Comment:  FEBRILE AS A BABY   Reproductive/Obstetrics                             Anesthesia Physical Anesthesia Plan  ASA: II  Anesthesia Plan: General   Post-op Pain Management:    Induction: Intravenous  PONV Risk Score and Plan: 2 and Dexamethasone and Ondansetron  Airway Management Planned: Oral ETT  Additional Equipment:   Intra-op Plan:   Post-operative Plan: Extubation in OR  Informed Consent: I have reviewed the patients History and Physical, chart, labs and discussed the procedure including the risks, benefits and alternatives for the proposed anesthesia with the patient or authorized  representative who has indicated his/her understanding and acceptance.   Dental advisory given  Plan Discussed with: CRNA and Anesthesiologist  Anesthesia Plan Comments:         Anesthesia Quick Evaluation

## 2017-03-19 NOTE — Op Note (Signed)
Abdominal Hernia Repair  Pre-operative Diagnosis: Ventral hernia  Post-operative Diagnosis: Umbilical hernia  Surgeon: Jerrol Banana. Burt Knack, MD FACS  Anesthesia: Gen. with endotracheal tube  Assistant: Surgical tech  Procedure Details  The patient was seen again in the Holding Room. The benefits, complications, treatment options, and expected outcomes were discussed with the patient. The risks of bleeding, infection, recurrence of symptoms, failure to resolve symptoms, bowel injury, mesh placement, mesh infection, any of which could require further surgery were reviewed with the patient. The likelihood of improving the patient's symptoms with return to their baseline status is good.  We discussed keloid formation as well.  The patient and/or family concurred with the proposed plan, giving informed consent.  The patient was taken to Operating Room, identified as Caitlyn Harris and the procedure verified.  A Time Out was held and the above information confirmed.  Prior to the induction of general anesthesia, antibiotic prophylaxis was administered. VTE prophylaxis was in place. General endotracheal anesthesia was then administered and tolerated well. After the induction, the abdomen was prepped with Chloraprep and draped in the sterile fashion. The patient was positioned in the supine position.  An existing keloid that was in the transverse position cephalad to the umbilicus was incised and extended laterally on each side.  Dissection down through obese subcutaneous tissue was performed and there was no sign of a ventral hernia in the area some scar on the fascia.  A few millimeters away adjacent to this was a classic umbilical hernia.  This was opened and reduced and the fascial edges were cleaned.  The rent itself measured subcentimeter in size.  There had been incarcerated preperitoneal fat within it.  With this being a simple umbilical hernia rather than a ventral hernia and the size of this it  was decided to close this primarily rather than rather than use mesh.  0 Ethibond figure-of-eight sutures were placed to repair the hernia.  Additional Marcaine was placed for a total of 30 cc.  The skin of the umbilicus was tacked back to the fascia with 3-0 Vicryls and then deep sutures of 3-0 Vicryl are placed followed by 4-0 subcuticular Monocryl Steri-Strips Mastisol sterile dressings were placed.  All sponge lap needle count was correct.  Findings:  Classic umbilical hernia with incarcerated preperitoneal fat.  No sign of ventral hernia from port.  Estimated Blood Loss: Nil         Drains: None         Specimens: None       Complications: none               [name] E. Burt Knack, MD, FACS

## 2017-03-19 NOTE — Anesthesia Procedure Notes (Signed)
Procedure Name: Intubation Performed by: Lance Muss, CRNA Pre-anesthesia Checklist: Patient identified, Patient being monitored, Timeout performed, Emergency Drugs available and Suction available Patient Re-evaluated:Patient Re-evaluated prior to induction Oxygen Delivery Method: Circle system utilized Preoxygenation: Pre-oxygenation with 100% oxygen Induction Type: IV induction Ventilation: Mask ventilation without difficulty Laryngoscope Size: Mac and 3 Grade View: Grade I Tube type: Oral Tube size: 7.0 mm Number of attempts: 1 Airway Equipment and Method: Stylet Placement Confirmation: ETT inserted through vocal cords under direct vision,  positive ETCO2 and breath sounds checked- equal and bilateral Secured at: 22 cm Tube secured with: Tape Dental Injury: Teeth and Oropharynx as per pre-operative assessment

## 2017-03-19 NOTE — Anesthesia Post-op Follow-up Note (Signed)
Anesthesia QCDR form completed.        

## 2017-03-19 NOTE — Telephone Encounter (Signed)
Patient's FMLA form was filled out and faxed to [employer] at [phone] Fax.

## 2017-03-19 NOTE — Transfer of Care (Signed)
Immediate Anesthesia Transfer of Care Note  Patient: Caitlyn Harris  Procedure(s) Performed: HERNIA REPAIR VENTRAL ADULT (N/A Abdomen)  Patient Location: PACU  Anesthesia Type:General  Level of Consciousness: sedated  Airway & Oxygen Therapy: Patient Spontanous Breathing and Patient connected to face mask oxygen  Post-op Assessment: Report given to RN and Post -op Vital signs reviewed and stable  Post vital signs: Reviewed and stable  Last Vitals:  Vitals:   03/19/17 0908 03/19/17 1013  BP: 121/76 130/83  Pulse: 82 81  Resp: 20 12  Temp: 36.8 C 36.9 C  SpO2: 97% 98%    Last Pain:  Vitals:   03/19/17 0908  TempSrc: Oral         Complications: No apparent anesthesia complications

## 2017-03-19 NOTE — Progress Notes (Signed)
Preoperative Review   Patient is met in the preoperative holding area. The history is reviewed in the chart and with the patient. I personally reviewed the options and rationale as well as the risks of this procedure that have been previously discussed with the patient.  We specifically discussed the options of primary repair versus mesh placement depending on the size of the hernial rent and the condition of the tissue.  The risk of mesh infection was discussed as well.  The risk of keloid formation and postoperative treatment of keloids was reviewed.  She forms keloids and her other incisions and this cannot be prevented.  All questions asked by the patient and/or family were answered to their satisfaction.  Patient agrees to proceed with this procedure at this time.  Florene Glen M.D. FACS

## 2017-03-19 NOTE — Discharge Instructions (Signed)
AMBULATORY SURGERY  °DISCHARGE INSTRUCTIONS ° ° °1) The drugs that you were given will stay in your system until tomorrow so for the next 24 hours you should not: ° °A) Drive an automobile °B) Make any legal decisions °C) Drink any alcoholic beverage ° ° °2) You may resume regular meals tomorrow.  Today it is better to start with liquids and gradually work up to solid foods. ° °You may eat anything you prefer, but it is better to start with liquids, then soup and crackers, and gradually work up to solid foods. ° ° °3) Please notify your doctor immediately if you have any unusual bleeding, trouble breathing, redness and pain at the surgery site, drainage, fever, or pain not relieved by medication. °4)  ° °5) Your post-operative visit with Dr.                     °           °     is: Date:                        Time:   ° °Please call to schedule your post-operative visit. ° °6) Additional Instructions: ° ° ° ° °Remove dressing in 24 hours. °May shower in 24 hours. °Leave paper strips in place. °Resume all home medications. °Follow-up with Dr. Cooper in 10 days. °

## 2017-03-19 NOTE — Anesthesia Postprocedure Evaluation (Signed)
Anesthesia Post Note  Patient: Caitlyn Harris  Procedure(s) Performed: HERNIA REPAIR VENTRAL ADULT (N/A Abdomen)  Patient location during evaluation: PACU Anesthesia Type: General Level of consciousness: awake and alert and oriented Pain management: pain level controlled Vital Signs Assessment: post-procedure vital signs reviewed and stable Respiratory status: spontaneous breathing, nonlabored ventilation and respiratory function stable Cardiovascular status: blood pressure returned to baseline and stable Postop Assessment: no signs of nausea or vomiting Anesthetic complications: no     Last Vitals:  Vitals:   03/19/17 1120 03/19/17 1218  BP: 131/79 115/73  Pulse: 80 76  Resp: 16 16  Temp: (!) 36 C (!) 36.3 C  SpO2: 99% 98%    Last Pain:  Vitals:   03/19/17 1218  TempSrc: Tympanic  PainSc: 5                  Morgen Ritacco

## 2017-03-26 ENCOUNTER — Ambulatory Visit (INDEPENDENT_AMBULATORY_CARE_PROVIDER_SITE_OTHER): Payer: BLUE CROSS/BLUE SHIELD | Admitting: Surgery

## 2017-03-26 ENCOUNTER — Encounter: Payer: BLUE CROSS/BLUE SHIELD | Admitting: Surgery

## 2017-03-26 ENCOUNTER — Encounter: Payer: Self-pay | Admitting: Surgery

## 2017-03-26 VITALS — BP 116/82 | HR 69 | Temp 97.6°F | Ht 72.0 in | Wt 237.8 lb

## 2017-03-26 DIAGNOSIS — K429 Umbilical hernia without obstruction or gangrene: Secondary | ICD-10-CM

## 2017-03-26 NOTE — Patient Instructions (Addendum)

## 2017-03-26 NOTE — Progress Notes (Signed)
Outpatient postop visit  03/26/2017  Caitlyn Harris is an 36 y.o. female.    Procedure: Umbilical hernia repair  CC: Minimal pain  HPI: This a patient who was believed to have a port site ventral hernia but on exploration it would prove to be a simple umbilical hernia.  It was subcentimeter incised and was repaired primarily. Patient has no nausea vomiting fevers or chills and only has minimal occasional pain. Medications reviewed.    Physical Exam:  There were no vitals taken for this visit.    PE: Keloid area of abdomen.  Mild resolving ecchymosis no erythema no drainage    Assessment/Plan:  Status post umbilical hernia repair with primary repair using Ethibond sutures. Patient doing very well recommend follow-up on an as-needed basis would recommend no heavy lifting for another 2 weeks. Florene Glen, MD, FACS

## 2017-04-27 DIAGNOSIS — R6889 Other general symptoms and signs: Secondary | ICD-10-CM | POA: Diagnosis not present

## 2017-04-27 DIAGNOSIS — J069 Acute upper respiratory infection, unspecified: Secondary | ICD-10-CM | POA: Diagnosis not present

## 2017-06-12 ENCOUNTER — Encounter: Payer: Self-pay | Admitting: Family Medicine

## 2017-06-12 ENCOUNTER — Ambulatory Visit (INDEPENDENT_AMBULATORY_CARE_PROVIDER_SITE_OTHER): Payer: BLUE CROSS/BLUE SHIELD | Admitting: Family Medicine

## 2017-06-12 VITALS — BP 116/80 | HR 81 | Temp 98.1°F | Wt 231.0 lb

## 2017-06-12 DIAGNOSIS — R05 Cough: Secondary | ICD-10-CM

## 2017-06-12 DIAGNOSIS — G43709 Chronic migraine without aura, not intractable, without status migrainosus: Secondary | ICD-10-CM

## 2017-06-12 DIAGNOSIS — R059 Cough, unspecified: Secondary | ICD-10-CM

## 2017-06-12 MED ORDER — AMOXICILLIN-POT CLAVULANATE 875-125 MG PO TABS: 1.0000 | ORAL_TABLET | Freq: Two times a day (BID) | ORAL | 0 refills | Status: DC

## 2017-06-12 MED ORDER — SUMATRIPTAN SUCCINATE 100 MG PO TABS: 100.0000 mg | ORAL_TABLET | ORAL | 1 refills | Status: DC | PRN

## 2017-06-12 MED ORDER — BENZONATATE 100 MG PO CAPS: 100.0000 mg | ORAL_CAPSULE | Freq: Three times a day (TID) | ORAL | 0 refills | Status: DC | PRN

## 2017-06-12 NOTE — Progress Notes (Signed)
Subjective:    Patient ID: Caitlyn Harris, female    DOB: Aug 19, 1980, 37 y.o.   MRN: [id number]  HPI This is a 37 yo female who presents today with 2-3 weeks of intermittent facial pain, pressure, yellow and clear nasal drainage. Has tried OTC dayquil/nyquil, Alleve Cold and sinus (usually helps, not now), otc cough medicine without relief. Started with congestion and drainage. Occasional pressure in ears, sore throat. Dry cough, occasional mucus. No fever. Decreased appetite. Feel run down. Typically has fall allergy symptoms. Distant smoking history, no asthma history.   Diarrhea x 1 and nausea this morning.   She requests refill of sumatriptan. No increase in migraines.   Past Medical History:  Diagnosis Date  . Anxiety   . Depression   . GERD (gastroesophageal reflux disease)    OCC-   . Hyperlipidemia   . Migraine   . Pre-eclampsia   . Seizures ([place])    FEBRILE AS A BABY   Past Surgical History:  Procedure Laterality Date  . ABDOMINAL HYSTERECTOMY    . ECTOPIC PREGNANCY SURGERY    . endometirosis    . HAND SURGERY     THUMB SURGERY  . LEEP    . LEEP    . VENTRAL HERNIA REPAIR N/A 03/19/2017   Procedure: HERNIA REPAIR VENTRAL ADULT;  Surgeon: Florene Glen, MD;  Location: [facility] ORS;  Service: General;  Laterality: N/A;   Family History  Problem Relation Age of Onset  . Hypertension Mother   . Renal cancer Father   . Hypertension Father    Social History   Tobacco Use  . Smoking status: Former Smoker    Years: 7.00    Types: Cigarettes    Last attempt to quit: 03/12/2009    Years since quitting: 8.2  . Smokeless tobacco: Never Used  . Tobacco comment: 1 PACK EVERY 2 WEEKS  Substance Use Topics  . Alcohol use: Yes    Comment: RARE  . Drug use: No      Review of Systems Per HPI    Objective:   Physical Exam  Constitutional: She is oriented to person, place, and time. She appears well-developed and well-nourished. No distress.  HENT:  Head:  Atraumatic.  Right Ear: Tympanic membrane, external ear and ear canal normal.  Left Ear: Tympanic membrane, external ear and ear canal normal.  Nose: Mucosal edema and rhinorrhea present. Right sinus exhibits no maxillary sinus tenderness and no frontal sinus tenderness. Left sinus exhibits no maxillary sinus tenderness and no frontal sinus tenderness.  Mouth/Throat: Uvula is midline and oropharynx is clear and moist.  Sounds congested.   Eyes: Conjunctivae are normal.  Neck: Normal range of motion. Neck supple.  Cardiovascular: Normal rate, regular rhythm and normal heart sounds.  Pulmonary/Chest: Effort normal and breath sounds normal.  Lymphadenopathy:    She has no cervical adenopathy.  Neurological: She is alert and oriented to person, place, and time.  Skin: Skin is warm and dry. She is not diaphoretic.  Psychiatric: She has a normal mood and affect. Her behavior is normal. Judgment and thought content normal.  Vitals reviewed.     BP 116/80   Pulse 81   Temp 98.1 F (36.7 C) (Oral)   Wt 231 lb (104.8 kg)   SpO2 99%   BMI 31.33 kg/m  Wt Readings from Last 3 Encounters:  06/12/17 231 lb (104.8 kg)  03/26/17 237 lb 12.8 oz (107.9 kg)  03/05/17 240 lb 9.6 oz (109.1 kg)  Assessment & Plan:  1. Chronic migraine without aura without status migrainosus, not intractable - SUMAtriptan (IMITREX) 100 MG tablet; Take 1 tablet (100 mg total) by mouth every 2 (two) hours as needed for migraine.  Dispense: 10 tablet; Refill: 1  2. Cough - Provided written and verbal information regarding diagnosis and treatment. -  Patient Instructions  Add Afrin type nasal spray 2 times a day for maximum of 4 days  Take a long acting anithistamine- generic allegra, zyrtec or claritin  If not better in 5-7 days, start antibiotic- take with food and full glass of water    - provided wait and see antibiotic if not better in 5-7 days can start Augmentin.  - benzonatate (TESSALON) 100 MG  capsule; Take 1-2 capsules (100-200 mg total) by mouth 3 (three) times daily as needed for cough.  Dispense: 30 capsule; Refill: 0   Clarene Reamer, FNP-BC  [facility] at [facility]  06/13/2017 12:14 PM

## 2017-06-12 NOTE — Patient Instructions (Signed)
Add Afrin type nasal spray 2 times a day for maximum of 4 days  Take a long acting anithistamine- generic allegra, zyrtec or claritin  If not better in 5-7 days, start antibiotic- take with food and full glass of water

## 2017-06-13 ENCOUNTER — Encounter: Payer: Self-pay | Admitting: Family Medicine

## 2017-07-10 DIAGNOSIS — H109 Unspecified conjunctivitis: Secondary | ICD-10-CM | POA: Diagnosis not present

## 2017-07-10 DIAGNOSIS — J020 Streptococcal pharyngitis: Secondary | ICD-10-CM | POA: Diagnosis not present

## 2017-08-13 ENCOUNTER — Other Ambulatory Visit: Payer: Self-pay | Admitting: Family Medicine

## 2017-08-13 DIAGNOSIS — E6609 Other obesity due to excess calories: Secondary | ICD-10-CM

## 2017-08-13 DIAGNOSIS — Z6831 Body mass index (BMI) 31.0-31.9, adult: Secondary | ICD-10-CM

## 2017-08-13 DIAGNOSIS — E559 Vitamin D deficiency, unspecified: Secondary | ICD-10-CM

## 2017-08-13 DIAGNOSIS — F330 Major depressive disorder, recurrent, mild: Secondary | ICD-10-CM

## 2017-08-13 DIAGNOSIS — F411 Generalized anxiety disorder: Secondary | ICD-10-CM

## 2017-08-17 ENCOUNTER — Other Ambulatory Visit (INDEPENDENT_AMBULATORY_CARE_PROVIDER_SITE_OTHER): Payer: BLUE CROSS/BLUE SHIELD

## 2017-08-17 DIAGNOSIS — Z6831 Body mass index (BMI) 31.0-31.9, adult: Secondary | ICD-10-CM

## 2017-08-17 DIAGNOSIS — F330 Major depressive disorder, recurrent, mild: Secondary | ICD-10-CM

## 2017-08-17 DIAGNOSIS — F411 Generalized anxiety disorder: Secondary | ICD-10-CM

## 2017-08-17 DIAGNOSIS — E6609 Other obesity due to excess calories: Secondary | ICD-10-CM

## 2017-08-17 DIAGNOSIS — E66811 Obesity, class 1: Secondary | ICD-10-CM

## 2017-08-17 DIAGNOSIS — E559 Vitamin D deficiency, unspecified: Secondary | ICD-10-CM | POA: Diagnosis not present

## 2017-08-17 LAB — CBC WITH DIFFERENTIAL/PLATELET
BASOS ABS: 0.1 10*3/uL (ref 0.0–0.1)
Basophils Relative: 1.3 % (ref 0.0–3.0)
EOS ABS: 0.2 10*3/uL (ref 0.0–0.7)
Eosinophils Relative: 2.6 % (ref 0.0–5.0)
HEMATOCRIT: 39.9 % (ref 36.0–46.0)
HEMOGLOBIN: 14.1 g/dL (ref 12.0–15.0)
LYMPHS PCT: 24.9 % (ref 12.0–46.0)
Lymphs Abs: 1.9 10*3/uL (ref 0.7–4.0)
MCHC: 35.4 g/dL (ref 30.0–36.0)
MCV: 88.5 fl (ref 78.0–100.0)
Monocytes Absolute: 0.3 10*3/uL (ref 0.1–1.0)
Monocytes Relative: 4.2 % (ref 3.0–12.0)
NEUTROS ABS: 5 10*3/uL (ref 1.4–7.7)
Neutrophils Relative %: 67 % (ref 43.0–77.0)
PLATELETS: 186 10*3/uL (ref 150.0–400.0)
RBC: 4.5 Mil/uL (ref 3.87–5.11)
RDW: 12.8 % (ref 11.5–15.5)
WBC: 7.5 10*3/uL (ref 4.0–10.5)

## 2017-08-17 LAB — COMPREHENSIVE METABOLIC PANEL
ALBUMIN: 4.4 g/dL (ref 3.5–5.2)
ALT: 7 U/L (ref 0–35)
AST: 10 U/L (ref 0–37)
Alkaline Phosphatase: 48 U/L (ref 39–117)
BUN: 12 mg/dL (ref 6–23)
CHLORIDE: 109 meq/L (ref 96–112)
CO2: 23 meq/L (ref 19–32)
CREATININE: 0.83 mg/dL (ref 0.40–1.20)
Calcium: 8.8 mg/dL (ref 8.4–10.5)
GFR: 83.34 mL/min (ref >60.00)
Glucose, Bld: 109 mg/dL — ABNORMAL HIGH (ref 70–99)
POTASSIUM: 3.7 meq/L (ref 3.5–5.1)
SODIUM: 139 meq/L (ref 135–145)
Total Bilirubin: 0.4 mg/dL (ref 0.2–1.2)
Total Protein: 7 g/dL (ref 6.0–8.3)

## 2017-08-17 LAB — LIPID PANEL
CHOL/HDL RATIO: 6
CHOLESTEROL: 147 mg/dL (ref 0–200)
HDL: 24.4 mg/dL — AB (ref >39.00)

## 2017-08-17 LAB — VITAMIN D 25 HYDROXY (VIT D DEFICIENCY, FRACTURES): VITD: 16.68 ng/mL — ABNORMAL LOW (ref 30.00–100.00)

## 2017-08-17 LAB — TSH: TSH: 0.89 u[IU]/mL (ref 0.35–4.50)

## 2017-08-17 LAB — HEMOGLOBIN A1C: Hgb A1c MFr Bld: 4.7 % (ref 4.6–6.5)

## 2017-08-17 LAB — LDL CHOLESTEROL, DIRECT: LDL DIRECT: 62 mg/dL

## 2017-08-24 ENCOUNTER — Encounter: Payer: Self-pay | Admitting: Family Medicine

## 2017-08-24 ENCOUNTER — Ambulatory Visit (INDEPENDENT_AMBULATORY_CARE_PROVIDER_SITE_OTHER): Payer: BLUE CROSS/BLUE SHIELD | Admitting: Family Medicine

## 2017-08-24 VITALS — BP 106/72 | HR 78 | Temp 98.2°F | Ht 72.25 in | Wt 231.5 lb

## 2017-08-24 DIAGNOSIS — Z0000 Encounter for general adult medical examination without abnormal findings: Secondary | ICD-10-CM

## 2017-08-24 DIAGNOSIS — G4700 Insomnia, unspecified: Secondary | ICD-10-CM | POA: Diagnosis not present

## 2017-08-24 DIAGNOSIS — K5909 Other constipation: Secondary | ICD-10-CM | POA: Diagnosis not present

## 2017-08-24 DIAGNOSIS — F330 Major depressive disorder, recurrent, mild: Secondary | ICD-10-CM

## 2017-08-24 DIAGNOSIS — N8110 Cystocele, unspecified: Secondary | ICD-10-CM

## 2017-08-24 MED ORDER — BUPROPION HCL ER (XL) 150 MG PO TB24: 150.0000 mg | ORAL_TABLET | Freq: Every day | ORAL | 1 refills | Status: DC

## 2017-08-24 NOTE — Progress Notes (Signed)
Subjective:    Patient ID: Caitlyn Harris, female    DOB: 1980-09-13, 37 y.o.   MRN: [id number]  HPI This is a 37 yo female who presents today for CPE.   Depression- Has had a rough time lately, a good friend died recently and her father's cancer is progressing. Has difficulty getting out of bed in the mornings, lack of motivation.   Constipation- not taking anything regularly, still has some difficulty with moving bowels.   Last CPE- 08/04/16 Mammo- NA Pap- complete hysterectomy Tdap- 05/06/13 Flu- annual Exercise- walking, swimming Sleep- poor, life long Diet- breakfast- muffin, cereal, lunch- [place] sandwich or fast food, Dinner- some fast food, cooks some of the time. Mostly drinks water, some sweet tea.   Past Medical History:  Diagnosis Date  . Anxiety   . Depression   . GERD (gastroesophageal reflux disease)    OCC-   . Hyperlipidemia   . Migraine   . Pre-eclampsia   . Seizures ([place])    FEBRILE AS A BABY   Past Surgical History:  Procedure Laterality Date  . ABDOMINAL HYSTERECTOMY    . ECTOPIC PREGNANCY SURGERY    . endometirosis    . HAND SURGERY     THUMB SURGERY  . LEEP    . LEEP    . VENTRAL HERNIA REPAIR N/A 03/19/2017   Procedure: HERNIA REPAIR VENTRAL ADULT;  Surgeon: Florene Glen, MD;  Location: [facility] ORS;  Service: General;  Laterality: N/A;   Family History  Problem Relation Age of Onset  . Hypertension Mother   . Renal cancer Father   . Hypertension Father    Social History   Tobacco Use  . Smoking status: Former Smoker    Years: 7.00    Types: Cigarettes    Last attempt to quit: 03/12/2009    Years since quitting: 8.4  . Smokeless tobacco: Never Used  . Tobacco comment: 1 PACK EVERY 2 WEEKS  Substance Use Topics  . Alcohol use: Yes    Comment: RARE  . Drug use: No      Review of Systems  Constitutional: Positive for fatigue. Negative for fever and unexpected weight change.  HENT: Positive for congestion.   Eyes:  Negative.   Respiratory: Negative.   Cardiovascular: Negative.   Gastrointestinal: Positive for constipation.  Endocrine: Negative.   Genitourinary: Negative.   Skin: Negative.   Allergic/Immunologic: Negative.   Neurological: Negative.   Psychiatric/Behavioral: Positive for dysphoric mood and sleep disturbance.       Objective:   Physical Exam  Constitutional: She is oriented to person, place, and time. She appears well-developed and well-nourished.  HENT:  Head: Normocephalic and atraumatic.  Cardiovascular: Normal rate, regular rhythm and normal heart sounds.  Pulmonary/Chest: Effort normal and breath sounds normal. Right breast exhibits no inverted nipple, no mass, no nipple discharge, no skin change and no tenderness. Left breast exhibits no inverted nipple, no mass, no nipple discharge, no skin change and no tenderness.  Abdominal: Soft. Bowel sounds are normal. There is no tenderness.  Genitourinary: Pelvic exam was performed with patient supine. There is no rash, tenderness or lesion on the right labia. There is no rash, tenderness or lesion on the left labia.  Genitourinary Comments: Cervix absent. Cystocele.   Musculoskeletal: She exhibits no edema.  Neurological: She is alert and oriented to person, place, and time.  Skin: Skin is warm and dry.  Psychiatric: She has a normal mood and affect. Her behavior is normal. Judgment and  thought content normal.  Vitals reviewed.     BP 106/72 (BP Location: Right Arm, Patient Position: Sitting, Cuff Size: Large)   Pulse 78   Temp 98.2 F (36.8 C) (Oral)   Ht 6' 0.25" (1.835 m)   Wt 231 lb 8 oz (105 kg)   SpO2 99%   BMI 31.18 kg/m  Wt Readings from Last 3 Encounters:  08/24/17 231 lb 8 oz (105 kg)  06/12/17 231 lb (104.8 kg)  03/26/17 237 lb 12.8 oz (107.9 kg)       Assessment & Plan:  1. Annual physical exam - Discussed and encouraged healthy lifestyle choices- adequate sleep, regular exercise, stress management and  healthy food choices.  - reviewed labs including elevated triglycerides, will recheck with 12 hour fast at follow up  2. Mild episode of recurrent major depressive disorder ([place]) - discussed symptoms and treatment with patient. Will add bupropion and discussed importance of sleep, whole foods diet and regular exercise.  - buPROPion (WELLBUTRIN XL) 150 MG 24 hr tablet; Take 1 tablet (150 mg total) by mouth daily.  Dispense: 90 tablet; Refill: 1 - follow up in 3 months, sooner if worsening symptoms  3. Insomnia, unspecified type - discussed sleep hygeine  4. Bladder prolapse, female, acquired - previously provided referral to urology, patient wants to hold off on this for now  5. Chronic constipation - encouraged daily Mira lax use, adequate fluid intake, increased fiber intake   Clarene Reamer, FNP-BC  [facility] at [facility], [place]  08/25/2017 12:14 PM

## 2017-08-24 NOTE — Patient Instructions (Addendum)
Watch your carbs very carefully, we need to recheck your lipid panel in 3 months  The medication I wanted to use for your insomnia will interact with your citalopram. Instead, please try nightly melatonin 5 mg 1 hour before bedtime.   Keep up the good work with your exercise and continue to work on making healthy food choices  For bowel movements, try Benefiber (generic is fine) every evening or a stool softener like generic Colace  Follow up in 3 months for labs and to check on new medication. Fast for 12 hours  Keeping You Healthy  Get These Tests 1. Blood Pressure- Have your blood pressure checked once a year by your health care provider.  Normal blood pressure is 120/80. 2. Weight- Have your body mass index (BMI) calculated to screen for obesity.  BMI is measure of body fat based on height and weight.  You can also calculate your own BMI at GravelBags.it. 3. Cholesterol- Have your cholesterol checked every 5 years starting at age 61 then yearly starting at age 28. 57. Chlamydia, HIV, and other sexually transmitted diseases- Get screened every year until age 13, then within three months of each new sexual provider. 5. Pap Test - Every 1-5 years; discuss with your health care provider. 6. Mammogram- Every 1-2 years starting at age 20--50  Take these medicines  Calcium with Vitamin D-Your body needs 1200 mg of Calcium each day and 7082104461 IU of Vitamin D daily.  Your body can only absorb 500 mg of Calcium at a time so Calcium must be taken in 2 or 3 divided doses throughout the day.  Multivitamin with folic acid- Once daily if it is possible for you to become pregnant.  Get these Immunizations  Gardasil-Series of three doses; prevents HPV related illness such as genital warts and cervical cancer.  Menactra-Single dose; prevents meningitis.  Tetanus shot- Every 10 years.  Flu shot-Every year.  Take these steps 1. Do not smoke-Your healthcare provider can help you  quit.  For tips on how to quit go to www.smokefree.gov or call 1-800 QUITNOW. 2. Be physically active- Exercise 5 days a week for at least 30 minutes.  If you are not already physically active, start slow and gradually work up to 30 minutes of moderate physical activity.  Examples of moderate activity include walking briskly, dancing, swimming, bicycling, etc. 3. Breast Cancer- A self breast exam every month is important for early detection of breast cancer.  For more information and instruction on self breast exams, ask your healthcare provider or https://www.patel.info/. 4. Eat a healthy diet- Eat a variety of healthy foods such as fruits, vegetables, whole grains, low fat milk, low fat cheeses, yogurt, lean meats, poultry and fish, beans, nuts, tofu, etc.  For more information go to www. Thenutritionsource.org 5. Drink alcohol in moderation- Limit alcohol intake to one drink or less per day. Never drink and drive. 6. Depression- Your emotional health is as important as your physical health.  If you're feeling down or losing interest in things you normally enjoy please talk to your healthcare provider about being screened for depression. 7. Dental visit- Brush and floss your teeth twice daily; visit your dentist twice a year. 8. Eye doctor- Get an eye exam at least every 2 years. 9. Helmet use- Always wear a helmet when riding a bicycle, motorcycle, rollerblading or skateboarding. 39. Safe sex- If you may be exposed to sexually transmitted infections, use a condom. 11. Seat belts- Seat belts can save your live;  always wear one. 12. Smoke/Carbon Monoxide detectors- These detectors need to be installed on the appropriate level of your home. Replace batteries at least once a year. 13. Skin cancer- When out in the sun please cover up and use sunscreen 15 SPF or higher. 14. Violence- If anyone is threatening or hurting you, please tell your healthcare provider.

## 2017-08-25 ENCOUNTER — Encounter: Payer: Self-pay | Admitting: Family Medicine

## 2017-08-25 DIAGNOSIS — G4700 Insomnia, unspecified: Secondary | ICD-10-CM | POA: Insufficient documentation

## 2017-08-25 DIAGNOSIS — N8111 Cystocele, midline: Secondary | ICD-10-CM | POA: Insufficient documentation

## 2017-08-25 DIAGNOSIS — K5909 Other constipation: Secondary | ICD-10-CM | POA: Insufficient documentation

## 2017-09-09 ENCOUNTER — Other Ambulatory Visit: Payer: Self-pay | Admitting: Family Medicine

## 2017-09-09 NOTE — Telephone Encounter (Signed)
Copied from [place] [id number]. Topic: Quick Communication - Rx Refill/Question >> Sep 09, 2017  4:10 PM Aurelio Brash B wrote: Medication: topiramate (TOPAMAX) 100 MG tablet citalopram (CELEXA) 40 MG tablet   Has the patient contacted their pharmacy? No had been doing mail order (Agent: If no, request that the patient contact the pharmacy for the refill.) (Agent: If yes, when and what did the pharmacy advise?)  Preferred Pharmacy (with phone number or street name): [id number] [address], [place] [phone] (Phone) [phone] (Fax)      Agent: Please be advised that RX refills may take up to 3 business days. We ask that you follow-up with your pharmacy.

## 2017-09-10 NOTE — Telephone Encounter (Signed)
LOV  08/24/17 Caitlyn Harris Medications do not have a provider's name.

## 2017-09-14 ENCOUNTER — Ambulatory Visit: Payer: Self-pay

## 2017-09-14 ENCOUNTER — Encounter: Payer: Self-pay | Admitting: Family Medicine

## 2017-09-14 ENCOUNTER — Ambulatory Visit (INDEPENDENT_AMBULATORY_CARE_PROVIDER_SITE_OTHER): Payer: BLUE CROSS/BLUE SHIELD | Admitting: Family Medicine

## 2017-09-14 VITALS — BP 110/76 | HR 67 | Temp 98.1°F | Ht 72.25 in | Wt 233.0 lb

## 2017-09-14 DIAGNOSIS — R42 Dizziness and giddiness: Secondary | ICD-10-CM

## 2017-09-14 DIAGNOSIS — G43709 Chronic migraine without aura, not intractable, without status migrainosus: Secondary | ICD-10-CM

## 2017-09-14 DIAGNOSIS — F330 Major depressive disorder, recurrent, mild: Secondary | ICD-10-CM | POA: Diagnosis not present

## 2017-09-14 MED ORDER — TOPIRAMATE 100 MG PO TABS
100.0000 mg | ORAL_TABLET | Freq: Two times a day (BID) | ORAL | 3 refills | Status: DC
Start: 2017-09-14 — End: 2017-12-11

## 2017-09-14 MED ORDER — CITALOPRAM HYDROBROMIDE 40 MG PO TABS: ORAL_TABLET | ORAL | 3 refills | Status: DC

## 2017-09-14 NOTE — Telephone Encounter (Signed)
Filled at office visit today

## 2017-09-14 NOTE — Telephone Encounter (Signed)
Pt c/o dizziness since last Thursday. Pt states that moving eyes or bending over worsens sx. Pt thinks her sx is from prescription med withdrawal. Pt stated she had a migraine last week and had dizziness then. Care advice given and appt made for today at 12:15 with PCP.   Reason for Disposition . [1] MODERATE dizziness (e.g., interferes with normal activities) AND [2] has NOT been evaluated by physician for this  (Exception: dizziness caused by heat exposure, sudden standing, or poor fluid intake)  Answer Assessment - Initial Assessment Questions 1. DESCRIPTION: "Describe your dizziness."     Dizziness when she moves her eyes or head 2. LIGHTHEADED: "Do you feel lightheaded?" (e.g., somewhat faint, woozy, weak upon standing)     yes 3. VERTIGO: "Do you feel like either you or the room is spinning or tilting?" (i.e. vertigo)     no 4. SEVERITY: "How bad is it?"  "Do you feel like you are going to faint?" "Can you stand and walk?"   - MILD - walking normally   - MODERATE - interferes with normal activities (e.g., work, school)    - SEVERE - unable to stand, requires support to walk, feels like passing out now.      Moderate-  5. ONSET:  "When did the dizziness begin?"     Last Thursday 6. AGGRAVATING FACTORS: "Does anything make it worse?" (e.g., standing, change in head position)    Moving eyes, bending over  7. HEART RATE: "Can you tell me your heart rate?" "How many beats in 15 seconds?"  (Note: not all patients can do this)       Yes- 17 8. CAUSE: "What do you think is causing the dizziness?"     Withdrawing from prescriptions are causing the sx 9. RECURRENT SYMPTOM: "Have you had dizziness before?" If so, ask: "When was the last time?" "What happened that time?"     Yes-last week had migraine 10. OTHER SYMPTOMS: "Do you have any other symptoms?" (e.g., fever, chest pain, vomiting, diarrhea, bleeding)       Hot and flushed pt currently work 89. PREGNANCY: "Is there any chance you are  pregnant?" "When was your last menstrual period?"       No had hysterectomy  Protocols used: DIZZINESS [facility]

## 2017-09-14 NOTE — Patient Instructions (Signed)
Please let me know if you have any problems

## 2017-09-14 NOTE — Telephone Encounter (Signed)
Pt calling about med refill.  Please call back.  Pt is out of medication

## 2017-09-14 NOTE — Progress Notes (Signed)
Subjective:    Patient ID: Caitlyn Harris, female    DOB: 1980-08-23, 37 y.o.   MRN: [id number]  HPI This is a 37 yo patient who presents today headache, nausea and dizziness. Started after she abruptly stopped her citalopram and topamax- has been off for 35 days. She forgot to refill her prescriptions over the weekend. Started to fill a little better today.  At last visit, Bupropion was added to help with depressive symptoms, patient reports no improvement after taking for several weeks so she stopped it.   Past Medical History:  Diagnosis Date  . Anxiety   . Depression   . GERD (gastroesophageal reflux disease)    OCC-   . Hyperlipidemia   . Migraine   . Pre-eclampsia   . Seizures ([place])    FEBRILE AS A BABY   Past Surgical History:  Procedure Laterality Date  . ABDOMINAL HYSTERECTOMY    . ECTOPIC PREGNANCY SURGERY    . endometirosis    . HAND SURGERY     THUMB SURGERY  . LEEP    . LEEP    . VENTRAL HERNIA REPAIR N/A 03/19/2017   Procedure: HERNIA REPAIR VENTRAL ADULT;  Surgeon: Florene Glen, MD;  Location: [facility] ORS;  Service: General;  Laterality: N/A;   Family History  Problem Relation Age of Onset  . Hypertension Mother   . Renal cancer Father   . Hypertension Father    Social History   Tobacco Use  . Smoking status: Former Smoker    Years: 7.00    Types: Cigarettes    Last attempt to quit: 03/12/2009    Years since quitting: 8.5  . Smokeless tobacco: Never Used  . Tobacco comment: 1 PACK EVERY 2 WEEKS  Substance Use Topics  . Alcohol use: Yes    Comment: RARE  . Drug use: No      Review of Systems Per HPI    Objective:   Physical Exam Physical Exam  Constitutional: Oriented to person, place, and time. e appears well-developed and well-nourished.  HENT:  Head: Normocephalic and atraumatic.  Eyes: Conjunctivae are normal.  Neck: Normal range of motion. Neck supple.  Cardiovascular: Normal rate, regular rhythm and normal heart sounds.     Pulmonary/Chest: Effort normal and breath sounds normal.  Musculoskeletal: Normal range of motion.  Neurological: Alert and oriented to person, place, and time.  Skin: Skin is warm and dry.  Psychiatric: Normal mood and affect. Behavior is normal. Judgment and thought content normal.  Vitals reviewed.     BP 110/76 (BP Location: Right Arm, Patient Position: Sitting, Cuff Size: Large)   Pulse 67   Temp 98.1 F (36.7 C) (Oral)   Ht 6' 0.25" (1.835 m)   Wt 233 lb (105.7 kg)   SpO2 99%   BMI 31.38 kg/m  Wt Readings from Last 3 Encounters:  09/14/17 233 lb (105.7 kg)  08/24/17 231 lb 8 oz (105 kg)  06/12/17 231 lb (104.8 kg)       Assessment & Plan:  1. Chronic migraine without aura without status migrainosus, not intractable - topiramate (TOPAMAX) 100 MG tablet; Take 1 tablet (100 mg total) by mouth 2 (two) times daily.  Dispense: 180 tablet; Refill: 3  2. Mild episode of recurrent major depressive disorder ([facility]) - citalopram (CELEXA) 40 MG tablet; TAKE 1 TABLET DAILY-BEDTIME  Dispense: 90 tablet; Refill: 3  3. Dizziness - Improved today, will restart meds and let me know if dizziness doesn't fully resolve  Clarene Reamer, FNP-BC  [facility] at [facility], [place] [facility]  09/16/2017 7:53 AM

## 2017-09-14 NOTE — Telephone Encounter (Signed)
patient is out of meds, checking status [phone]

## 2017-09-14 NOTE — Telephone Encounter (Signed)
Historical Medication not sure of the instructions.

## 2017-09-16 ENCOUNTER — Encounter: Payer: Self-pay | Admitting: Family Medicine

## 2017-10-07 ENCOUNTER — Other Ambulatory Visit: Payer: Self-pay | Admitting: Family Medicine

## 2017-10-07 DIAGNOSIS — G43709 Chronic migraine without aura, not intractable, without status migrainosus: Secondary | ICD-10-CM

## 2017-11-17 ENCOUNTER — Other Ambulatory Visit: Payer: Self-pay | Admitting: Family Medicine

## 2017-11-17 DIAGNOSIS — E781 Pure hyperglyceridemia: Secondary | ICD-10-CM

## 2017-11-24 ENCOUNTER — Other Ambulatory Visit: Payer: BLUE CROSS/BLUE SHIELD

## 2017-11-30 ENCOUNTER — Ambulatory Visit: Payer: BLUE CROSS/BLUE SHIELD | Admitting: Family Medicine

## 2017-12-01 DIAGNOSIS — R6889 Other general symptoms and signs: Secondary | ICD-10-CM | POA: Diagnosis not present

## 2017-12-01 DIAGNOSIS — J020 Streptococcal pharyngitis: Secondary | ICD-10-CM | POA: Diagnosis not present

## 2017-12-04 ENCOUNTER — Other Ambulatory Visit (INDEPENDENT_AMBULATORY_CARE_PROVIDER_SITE_OTHER): Payer: BLUE CROSS/BLUE SHIELD

## 2017-12-04 DIAGNOSIS — E781 Pure hyperglyceridemia: Secondary | ICD-10-CM

## 2017-12-04 LAB — LIPID PANEL
CHOLESTEROL: 144 mg/dL (ref 0–200)
HDL: 23.7 mg/dL — AB (ref >39.00)
Total CHOL/HDL Ratio: 6

## 2017-12-04 LAB — LDL CHOLESTEROL, DIRECT: LDL DIRECT: 71 mg/dL

## 2017-12-11 ENCOUNTER — Encounter: Payer: Self-pay | Admitting: Family Medicine

## 2017-12-11 ENCOUNTER — Ambulatory Visit (INDEPENDENT_AMBULATORY_CARE_PROVIDER_SITE_OTHER): Payer: BLUE CROSS/BLUE SHIELD | Admitting: Family Medicine

## 2017-12-11 VITALS — BP 110/80 | HR 76 | Temp 97.5°F | Ht 72.25 in | Wt 224.0 lb

## 2017-12-11 DIAGNOSIS — E781 Pure hyperglyceridemia: Secondary | ICD-10-CM | POA: Diagnosis not present

## 2017-12-11 DIAGNOSIS — F330 Major depressive disorder, recurrent, mild: Secondary | ICD-10-CM

## 2017-12-11 DIAGNOSIS — G43709 Chronic migraine without aura, not intractable, without status migrainosus: Secondary | ICD-10-CM | POA: Diagnosis not present

## 2017-12-11 MED ORDER — SUMATRIPTAN SUCCINATE 100 MG PO TABS: ORAL_TABLET | ORAL | 1 refills | Status: DC

## 2017-12-11 MED ORDER — CITALOPRAM HYDROBROMIDE 40 MG PO TABS: ORAL_TABLET | ORAL | 3 refills | Status: DC

## 2017-12-11 MED ORDER — TOPIRAMATE 100 MG PO TABS: 100.0000 mg | ORAL_TABLET | Freq: Two times a day (BID) | ORAL | 3 refills | Status: DC

## 2017-12-11 NOTE — Progress Notes (Signed)
Subjective:    Patient ID: Caitlyn Harris, female    DOB: Mar 11, 1981, 37 y.o.   MRN: [id number]  HPI This is a 37 yo female who presents today for follow up of depression, elevated triglycerides and migraines.    Depression- Her dad passed away unexpectantly last month. 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   Has been feeling more depressed, not sleeping well. Has tried formula 303 for sleep with little improvement.  Tossing and turning at night. Increased sleeping.   Elevated triglycerides- chronically moderately elevated for years according to records. Normal hgba1c (4.7), normal LFTs. Total cholesterol 144, LDL 71.   Requests refills of migraine meds. Occasional migraines, good relief with sumatriptan and daily topamax.   Past Medical History:  Diagnosis Date  . Anxiety   . Depression   . GERD (gastroesophageal reflux disease)    OCC-   . Hyperlipidemia   . Migraine   . Pre-eclampsia   . Seizures ([place])    FEBRILE AS A BABY   Past Surgical History:  Procedure Laterality Date  . ABDOMINAL HYSTERECTOMY    . ECTOPIC PREGNANCY SURGERY    . endometirosis    . HAND SURGERY     THUMB SURGERY  . LEEP    . LEEP    . VENTRAL HERNIA REPAIR N/A 03/19/2017   Procedure: HERNIA REPAIR VENTRAL ADULT;  Surgeon: Florene Glen, MD;  Location: [facility] ORS;  Service: General;  Laterality: N/A;   Family History  Problem Relation Age of Onset  . Hypertension Mother   . Renal cancer Father   . Hypertension Father    Social History   Tobacco Use  . Smoking status: Former Smoker    Years: 7.00    Types: Cigarettes    Last attempt to quit: 03/12/2009    Years since quitting: 8.7  . Smokeless tobacco: Never Used  . Tobacco comment: 1 PACK EVERY 2 WEEKS  Substance Use Topics  . Alcohol use: Yes    Comment: RARE  . Drug use: No      Review of Systems Per HPI    Objective:   Physical Exam  Constitutional: She is oriented to person, place, and  time. She appears well-developed and well-nourished. No distress.  HENT:  Head: Normocephalic and atraumatic.  Eyes: Conjunctivae are normal.  Cardiovascular: Normal rate.  Pulmonary/Chest: Effort normal.  Neurological: She is alert and oriented to person, place, and time.  Skin: She is not diaphoretic.  Psychiatric: Her speech is normal and behavior is normal. Judgment and thought content normal. Cognition and memory are normal. She exhibits a depressed mood.  Tearful at times.    Vitals reviewed.     BP 110/80 (BP Location: Right Arm, Patient Position: Sitting, Cuff Size: Normal)   Pulse 76   Temp (!) 97.5 F (36.4 C) (Oral)   Ht 6' 0.25" (1.835 m)   Wt 224 lb (101.6 kg)   SpO2 99%   BMI 30.17 kg/m  Wt Readings from Last 3 Encounters:  12/11/17 224 lb (101.6 kg)  09/14/17 233 lb (105.7 kg)  08/24/17 231 lb 8 oz (105 kg)       Assessment & Plan:  1. Mild episode of recurrent major depressive disorder ([place]) - exacerbated by death of her father, discussed grieving process, self care, improving sleep, encouraged her to make counseling appointment and provided contact information - RTC precautions reviewed - citalopram (CELEXA) 40 MG tablet; TAKE 1 TABLET DAILY-BEDTIME  Dispense: 90 tablet; Refill: 3  2. Hypertriglyceridemia - discussed diet and decreasing fatty and starchy foods, increasing exercise - recheck in 6 months  3. Chronic migraine without aura without status migrainosus, not intractable - topiramate (TOPAMAX) 100 MG tablet; Take 1 tablet (100 mg total) by mouth 2 (two) times daily.  Dispense: 180 tablet; Refill: 3 - SUMAtriptan (IMITREX) 100 MG tablet; Take 1/2 to 1 tablet at migraine onset. May repeat in 2 hours x 1 if needed.  Dispense: 10 tablet; Refill: [place], FNP-BC  [facility] [place]  12/15/2017 7:09 AM

## 2017-12-11 NOTE — Patient Instructions (Signed)
Good to see you today  I'm sorry to hear about your father.   Try to keep your sleep to a regular schedule  Call about counseling  Follow up in 6 months and we'll recheck your labs

## 2017-12-15 ENCOUNTER — Encounter: Payer: Self-pay | Admitting: Family Medicine

## 2017-12-21 ENCOUNTER — Telehealth: Payer: Self-pay | Admitting: Family Medicine

## 2017-12-21 ENCOUNTER — Ambulatory Visit (INDEPENDENT_AMBULATORY_CARE_PROVIDER_SITE_OTHER): Payer: BLUE CROSS/BLUE SHIELD | Admitting: Family Medicine

## 2017-12-21 ENCOUNTER — Other Ambulatory Visit: Payer: Self-pay | Admitting: Family Medicine

## 2017-12-21 ENCOUNTER — Encounter: Payer: Self-pay | Admitting: Family Medicine

## 2017-12-21 VITALS — BP 114/70 | HR 89 | Temp 97.9°F | Ht 72.25 in | Wt 224.0 lb

## 2017-12-21 DIAGNOSIS — N644 Mastodynia: Secondary | ICD-10-CM | POA: Diagnosis not present

## 2017-12-21 DIAGNOSIS — N6452 Nipple discharge: Secondary | ICD-10-CM

## 2017-12-21 NOTE — Patient Instructions (Signed)
Good to see you today  Go to the lab  Please stop at the front desk to schedule your ultrasound and mammogram- I will notify you of results

## 2017-12-21 NOTE — Telephone Encounter (Signed)
Spoke with jamie @ [place] Please change mammogram order to [id number] And add ultra sound right [id number] Thanks

## 2017-12-21 NOTE — Telephone Encounter (Signed)
Done

## 2017-12-21 NOTE — Progress Notes (Signed)
Subjective:    Patient ID: Caitlyn Harris, female    DOB: December 08, 1980, 37 y.o.   MRN: [id number]  HPI This is a 37 yo female who presents with right breast discharge. Has noticed some increased sensation and itchiness, tenderness of right breast over last couple of months. Noticed some milky discharge about 5 days ago after rubbing breast. Has not noticed any discharge since.  History of hysterectomy, has both ovaries.  Doing a little better since I saw her a couple of weeks ago. Her father's memorial service was this past weekend and it went well. She is starting counseling next week.   Past Medical History:  Diagnosis Date  . Anxiety   . Depression   . GERD (gastroesophageal reflux disease)    OCC-   . Hyperlipidemia   . Migraine   . Pre-eclampsia   . Seizures ([place])    FEBRILE AS A BABY   Past Surgical History:  Procedure Laterality Date  . ABDOMINAL HYSTERECTOMY    . ECTOPIC PREGNANCY SURGERY    . endometirosis    . HAND SURGERY     THUMB SURGERY  . LEEP    . LEEP    . VENTRAL HERNIA REPAIR N/A 03/19/2017   Procedure: HERNIA REPAIR VENTRAL ADULT;  Surgeon: Florene Glen, MD;  Location: [facility] ORS;  Service: General;  Laterality: N/A;   Family History  Problem Relation Age of Onset  . Hypertension Mother   . Renal cancer Father   . Hypertension Father    Social History   Tobacco Use  . Smoking status: Former Smoker    Years: 7.00    Types: Cigarettes    Last attempt to quit: 03/12/2009    Years since quitting: 8.7  . Smokeless tobacco: Never Used  . Tobacco comment: 1 PACK EVERY 2 WEEKS  Substance Use Topics  . Alcohol use: Yes    Comment: RARE  . Drug use: No      Review of Systems Per HPI    Objective:   Physical Exam  Constitutional: She is oriented to person, place, and time. She appears well-developed and well-nourished. No distress.  HENT:  Head: Normocephalic and atraumatic.  Eyes: Conjunctivae are normal.  Cardiovascular: Normal rate.    Pulmonary/Chest: Effort normal. Right breast exhibits no inverted nipple, no mass, no nipple discharge and no skin change. Left breast exhibits no inverted nipple, no mass, no nipple discharge and no skin change. No breast swelling. Breasts are symmetrical.  Patient was examined in sitting and supine positions. No erythema, warmth.   Neurological: She is alert and oriented to person, place, and time.  Skin: Skin is warm and dry. She is not diaphoretic.  Psychiatric: She has a normal mood and affect. Her behavior is normal. Judgment and thought content normal.  Vitals reviewed.     BP 114/70 (BP Location: Right Arm, Patient Position: Sitting, Cuff Size: Normal)   Pulse 89   Temp 97.9 F (36.6 C) (Oral)   Ht 6' 0.25" (1.835 m)   Wt 224 lb (101.6 kg)   SpO2 97%   BMI 30.17 kg/m  Wt Readings from Last 3 Encounters:  12/21/17 224 lb (101.6 kg)  12/11/17 224 lb (101.6 kg)  09/14/17 233 lb (105.7 kg)       Assessment & Plan:  1. Nipple discharge - will notify her of results when available - Prolactin - US BREAST LTD UNI RIGHT INC AXILLA; Future - MM Digital Diagnostic Unilat R; Future  2. Breast pain - US BREAST LTD UNI RIGHT INC AXILLA; Future - MM Digital Diagnostic Unilat R; Future   Clarene Reamer, FNP-BC  [facility]  12/21/2017 8:37 AM

## 2017-12-21 NOTE — Addendum Note (Signed)
Addended by: Clarene Reamer B on: 12/21/2017 08:51 AM   Modules accepted: Orders

## 2017-12-22 LAB — PROLACTIN: Prolactin: 13.3 ng/mL

## 2017-12-23 ENCOUNTER — Ambulatory Visit (INDEPENDENT_AMBULATORY_CARE_PROVIDER_SITE_OTHER): Payer: BLUE CROSS/BLUE SHIELD | Admitting: Psychology

## 2017-12-23 DIAGNOSIS — F331 Major depressive disorder, recurrent, moderate: Secondary | ICD-10-CM | POA: Diagnosis not present

## 2017-12-29 ENCOUNTER — Ambulatory Visit
Admission: RE | Admit: 2017-12-29 | Discharge: 2017-12-29 | Disposition: A | Discharge status: Home / Self Care | Payer: BLUE CROSS/BLUE SHIELD | Source: Ambulatory Visit | Attending: Family Medicine | Admitting: Family Medicine

## 2017-12-29 DIAGNOSIS — N644 Mastodynia: Secondary | ICD-10-CM | POA: Insufficient documentation

## 2017-12-29 DIAGNOSIS — N6452 Nipple discharge: Secondary | ICD-10-CM

## 2017-12-29 DIAGNOSIS — R928 Other abnormal and inconclusive findings on diagnostic imaging of breast: Secondary | ICD-10-CM | POA: Diagnosis not present

## 2018-01-18 ENCOUNTER — Ambulatory Visit (INDEPENDENT_AMBULATORY_CARE_PROVIDER_SITE_OTHER): Payer: BLUE CROSS/BLUE SHIELD | Admitting: Obstetrics & Gynecology

## 2018-01-18 ENCOUNTER — Encounter: Payer: Self-pay | Admitting: Obstetrics & Gynecology

## 2018-01-18 VITALS — BP 120/80 | Ht 73.0 in | Wt 224.0 lb

## 2018-01-18 DIAGNOSIS — N8111 Cystocele, midline: Secondary | ICD-10-CM

## 2018-01-18 DIAGNOSIS — Z8742 Personal history of other diseases of the female genital tract: Secondary | ICD-10-CM | POA: Diagnosis not present

## 2018-01-18 DIAGNOSIS — N816 Rectocele: Secondary | ICD-10-CM | POA: Diagnosis not present

## 2018-01-18 DIAGNOSIS — R102 Pelvic and perineal pain: Secondary | ICD-10-CM | POA: Diagnosis not present

## 2018-01-18 NOTE — Progress Notes (Signed)
Pelvic Pain Patient is a [id number] WF who presents for evaluation of abdominal and pelvic pain. The pain is described as aching and cramping, and is 6/10 in intensity. Pain is located in the suprapubic and deep pelvis area with radiation to the back. Onset was gradual occurring for years.  Symptoms have been unchanged since. Aggravating factors: none. Alleviating factors: none. Associated symptoms: Pt describes prolapse type symptoms with vaginal pressure and difficulty completing bowel movements without help of vaginal hand.  No incontinence.  Pain when jump son trampoline. Also, still has dyspareunia.  The patient denies chills, fever, nausea and vomiting. Risk factors for pelvic/abdominal pain include :  Pt has had pelvic pain, cysts, endometriosis and adenomyosis.  She has had LEEP for cervical dysplasia, TLH for endometriosis (2017), prior ectopic surgery, prior NSVD x2.   PMHx: She  has a past medical history of Anxiety, Depression, GERD (gastroesophageal reflux disease), Hyperlipidemia, Migraine, Pre-eclampsia, and Seizures ([place]). Also,  has a past surgical history that includes Abdominal hysterectomy; LEEP; LEEP; Ectopic pregnancy surgery; endometirosis; Hand surgery; Ventral hernia repair (N/A, 03/19/2017); and Cervical biopsy w/ loop electrode excision., family history includes Hypertension in her father and mother; Renal cancer in her father.,  reports that she quit smoking about 8 years ago. Her smoking use included cigarettes. She quit after 7.00 years of use. She has never used smokeless tobacco. She reports that she drinks alcohol. She reports that she does not use drugs.  She has a current medication list which includes the following prescription(s): acetaminophen, citalopram, ibuprofen, omeprazole, sumatriptan, topiramate, and ondansetron. Also, has No Known Allergies.  Review of Systems  Constitutional: Negative for chills, fever and malaise/fatigue.  HENT: Negative for congestion, sinus  pain and sore throat.   Eyes: Negative for blurred vision and pain.  Respiratory: Negative for cough and wheezing.   Cardiovascular: Negative for chest pain and leg swelling.  Gastrointestinal: Positive for diarrhea. Negative for abdominal pain, constipation, heartburn, nausea and vomiting.  Genitourinary: Negative for dysuria, frequency, hematuria and urgency.  Musculoskeletal: Negative for back pain, joint pain, myalgias and neck pain.  Skin: Negative for itching and rash.       Skin tags  Neurological: Positive for dizziness and headaches. Negative for tremors and weakness.  Endo/Heme/Allergies: Does not bruise/bleed easily.  Psychiatric/Behavioral: Positive for depression. The patient is nervous/anxious. The patient does not have insomnia.    Objective: BP 120/80   Ht 6\' 1"  (1.854 m)   Wt 224 lb (101.6 kg)   BMI 29.55 kg/m  Physical Exam  Constitutional: She is oriented to person, place, and time. She appears well-developed and well-nourished. No distress.  Genitourinary: Rectum normal and vagina normal. Pelvic exam was performed with patient supine. There is no rash or lesion on the right labia. There is no rash or lesion on the left labia. Vagina exhibits no lesion. No bleeding in the vagina.  Right adnexum displays tenderness. Right adnexum does not display mass.  Left adnexum displays tenderness. Left adnexum does not display mass.  Genitourinary Comments: Absent Uterus Absent cervix Vaginal cuff well healed Gr 1 rectocele and cystocele  HENT:  Head: Normocephalic and atraumatic. Head is without laceration.  Right Ear: Hearing normal.  Left Ear: Hearing normal.  Nose: No epistaxis.  No foreign bodies.  Mouth/Throat: Uvula is midline, oropharynx is clear and moist and mucous membranes are normal.  Eyes: Pupils are equal, round, and reactive to light.  Neck: Normal range of motion. Neck supple. No thyromegaly present.  Cardiovascular: Normal  rate and regular rhythm. Exam  reveals no gallop and no friction rub.  No murmur heard. Pulmonary/Chest: Effort normal and breath sounds normal. No respiratory distress. She has no wheezes. Right breast exhibits no mass, no skin change and no tenderness. Left breast exhibits no mass, no skin change and no tenderness.  Abdominal: Soft. Bowel sounds are normal. She exhibits no distension. There is tenderness in the right lower quadrant, suprapubic area and left lower quadrant. There is no rebound, no guarding, no tenderness at McBurney's point and negative Murphy's sign.  Musculoskeletal: Normal range of motion.  Neurological: She is alert and oriented to person, place, and time. No cranial nerve deficit.  Skin: Skin is warm and dry.  Psychiatric: She has a normal mood and affect. Judgment normal.  Vitals reviewed.   ASSESSMENT/PLAN:   Problem List Items Addressed This Visit      Digestive   Rectocele     Genitourinary   Cystocele, midline     Other   Pelvic pain - Primary   Relevant Orders   US PELVIS TRANSVANGINAL NON-OB (TV ONLY)   History of endometriosis   Relevant Orders   US PELVIS TRANSVANGINAL NON-OB (TV ONLY)    Options for symptomatic rectocele treatment discussed, need for repair and need for recovery with minimal lifting, and future need for reconstruction over the decades.   [place] to assess for cysts May need Dx Lap to see if endometriosis has persisted, as she continues w CPP and dyspareunia  Skin tags expressed as a concern but min to none seen on exam; no warts or concerning lesions seen  Barnett Applebaum, MD, [facility], [facility] 01/18/2018  5:11 PM

## 2018-01-18 NOTE — Patient Instructions (Signed)
Transvaginal Ultrasound A transvaginal ultrasound, also called an endovaginal ultrasound, is a test that uses harmless sound waves to take pictures of the female genital tract. The pictures are taken with a device, called a transducer, that is placed in the vagina. This test may be done to:  Check for problems with your pregnancy.  Examine your developing baby.  Check for anything abnormal in the uterus or ovaries.  Evaluate pelvic pain or bleeding.  Tell a health care provider about:  Any allergies you have.  All medicines you are taking, including vitamins, herbs, eye drops, creams, and over-the-counter medicines.  Any blood disorders you have.  Any surgeries you have had.  Any medical conditions you have.  Whether you are pregnant or may be pregnant.  Whether you are having your period. What are the risks? There are no known risks or complications of having this test. What happens before the procedure?  This test needs to be done when your bladder is empty. Follow your health care provider's instructions about drinking fluids and emptying your bladder before the test. What happens during the procedure?  You will empty your bladder.  You will undress from the waist down.  You will lie down on an examining table, with your knees bent and your feet in foot holders.  A health care provider will cover the transducer with a sterile condom.  A gel will be put on the transducer. The gel helps transmit the sound waves and prevents irritation to your vagina.  The technician will insert the transducer into your vagina to get images. These will be displayed on a monitor that looks like a small television screen.  The transducer will be removed when the procedure is complete. What happens after the procedure?  It is your responsibility to get your test results. Ask your health care provider or the department performing the test when your results will be ready.  Keep follow-up  visits as told by your health care provider. This is important. This information is not intended to replace advice given to you by your health care provider. Make sure you discuss any questions you have with your health care provider. Document Released: 03/05/2004 Document Revised: 11/25/2015 Document Reviewed: 08/23/2014 Elsevier Interactive Patient Education  [employer].

## 2018-02-01 ENCOUNTER — Ambulatory Visit (INDEPENDENT_AMBULATORY_CARE_PROVIDER_SITE_OTHER): Payer: BLUE CROSS/BLUE SHIELD

## 2018-02-01 ENCOUNTER — Ambulatory Visit (INDEPENDENT_AMBULATORY_CARE_PROVIDER_SITE_OTHER): Payer: BLUE CROSS/BLUE SHIELD | Admitting: Obstetrics & Gynecology

## 2018-02-01 ENCOUNTER — Encounter: Payer: Self-pay | Admitting: Obstetrics & Gynecology

## 2018-02-01 VITALS — BP 120/80 | Ht 73.0 in | Wt 224.0 lb

## 2018-02-01 DIAGNOSIS — N83292 Other ovarian cyst, left side: Secondary | ICD-10-CM

## 2018-02-01 DIAGNOSIS — N809 Endometriosis, unspecified: Secondary | ICD-10-CM | POA: Insufficient documentation

## 2018-02-01 DIAGNOSIS — N83291 Other ovarian cyst, right side: Secondary | ICD-10-CM

## 2018-02-01 DIAGNOSIS — Z8742 Personal history of other diseases of the female genital tract: Secondary | ICD-10-CM

## 2018-02-01 DIAGNOSIS — G8929 Other chronic pain: Secondary | ICD-10-CM | POA: Insufficient documentation

## 2018-02-01 DIAGNOSIS — N816 Rectocele: Secondary | ICD-10-CM

## 2018-02-01 DIAGNOSIS — R102 Pelvic and perineal pain: Secondary | ICD-10-CM

## 2018-02-01 DIAGNOSIS — N83202 Unspecified ovarian cyst, left side: Secondary | ICD-10-CM

## 2018-02-01 DIAGNOSIS — N83201 Unspecified ovarian cyst, right side: Secondary | ICD-10-CM | POA: Insufficient documentation

## 2018-02-01 NOTE — Progress Notes (Signed)
  HPI: Pt has pain from endometriosis that continues despite hysterectomy 2 years ago.  Pt has lower quadrant usually over 'ovaries' pain that is severe at times with aching, cramping qualities. Radiates to back. Also has dyspareunia. Also continues to have difficulties w BMs due to rectocele.  Ultrasound demonstrates cyst seen on both ovaries, see below  PMHx: She  has a past medical history of Anxiety, Depression, GERD (gastroesophageal reflux disease), Hyperlipidemia, Migraine, Pre-eclampsia, and Seizures ([place]). Also,  has a past surgical history that includes Abdominal hysterectomy; LEEP; LEEP; Ectopic pregnancy surgery; endometirosis; Hand surgery; Ventral hernia repair (N/A, 03/19/2017); and Cervical biopsy w/ loop electrode excision., family history includes Hypertension in her father and mother; Renal cancer in her father.,  reports that she quit smoking about 8 years ago. Her smoking use included cigarettes. She quit after 7.00 years of use. She has never used smokeless tobacco. She reports that she drinks alcohol. She reports that she does not use drugs.  She has a current medication list which includes the following prescription(s): acetaminophen, citalopram, ibuprofen, omeprazole, ondansetron, sumatriptan, and topiramate. Also, has No Known Allergies.  Review of Systems  All other systems reviewed and are negative.  Objective: BP 120/80   Ht 6\' 1"  (1.854 m)   Wt 224 lb (101.6 kg)   BMI 29.55 kg/m   Physical examination Constitutional NAD, Conversant  Skin No rashes, lesions or ulceration.   Extremities: Moves all appropriately.  Normal ROM for age. No lymphadenopathy.  Neuro: Grossly intact  Psych: Oriented to PPT.  Normal mood. Normal affect.   US Pelvis Transvanginal Non-ob (tv Only)  Result Date: 02/01/2018 Patient Name: Caitlyn Harris DOB: 1980/07/19 MRN: [id number] ULTRASOUND REPORT Location: [facility] Date of Service: 02/01/2018 Indications:Pelvic Pain Findings:  Hysterectomy Right Ovary measures 3.7 x 2.6 x 2.5 cm. It is not normal in appearance. Complex multi-septate cyst measuring 3.4 x 2.0 x 2.1 cm. Left Ovary measures 4.0 x 3.5 x 2.6 cm. It is not normal in appearance. Two complex cysts    1. 2.4 x 1.6 x 1.7 cm    2. 1.6 x 1.3 x 1.6 cm Survey of the adnexa demonstrates no adnexal masses. There is no free fluid in the cul de sac. Impression: 1. Bilateral complex ovarian cysts. Recommendations: 1.Clinical correlation with the patient's History and Physical Exam. Vita Barley, RDMS RVT Review of ULTRASOUND.    I have personally reviewed images and report of recent ultrasound done at [facility].    Plan of management to be discussed with patient. Barnett Applebaum, MD, [facility], [facility] 02/01/2018  2:17 PM   Assessment:  Endometriosis  Bilateral ovarian cysts  Chronic female pelvic pain  Rectocele  Options of surgery to remove cysts and any endometriosis discussed, and if endometriosis found option for BSO vs excision and Lupron post op discussed.  Pros and cons of oophorectomy and menopause discussed.    We have decided to proceed with Lap, Cystectomy, Endometriosis Excision, And Rectocele Repair.  Lupron post op if endometriosis found.  Future BSO if still no change.  Post op restrictions particularly with lifting after rectocele repair discussed; she will need to be out of work for 6 weeks.  A total of 15 minutes were spent face-to-face with the patient during this encounter and over half of that time dealt with counseling and coordination of care.  Barnett Applebaum, MD, [facility], [facility] 02/01/2018  2:40 PM

## 2018-02-02 ENCOUNTER — Telehealth: Payer: Self-pay | Admitting: Obstetrics & Gynecology

## 2018-02-02 NOTE — Telephone Encounter (Signed)
Patient is aware of H&P at [facility] on 02/22/18 @ 8:20am w/ Dr Kenton Kingfisher, [place] phone interview to be scheduled, and OR on 03/02/18. Patient is aware she may receive calls from the [facility] Patient confirmed BCBS and no secondary insurance. [employer] info discussed w/ patient. No authorization required, Ref# [id number]. Patient is aware she can drop off FMLA paperwork anytime, does not have to wait for H&P appointment. Patient did not have any additional questions. Ext given.

## 2018-02-02 NOTE — Telephone Encounter (Signed)
-----   Message from Gae Dry, MD sent at 02/01/2018  2:39 PM EDT ----- Regarding: surgery Surgery Booking Request Patient Full Name:  Caitlyn Harris  MRN: [id number]  DOB: December 04, 1980  Surgeon: Hoyt Koch, MD  Requested Surgery Date and Time: NOV 26 Primary Diagnosis AND Code: Pelvic pain, Endometriosis, Rectocele, Ovarian cysts Secondary Diagnosis and Code:  Surgical Procedure: Operative laparoscopy, Bilateral Ovarian cystectomy, Excision of endometriosis, and Posterior colporrhaphy L&D Notification: No Admission Status: same day surgery Length of Surgery: 1 hr Special Case Needs: no H&P: yes (date) Phone Interview???: yes Interpreter: Language:  Medical Clearance: no Special Scheduling Instructions: no

## 2018-02-22 ENCOUNTER — Encounter: Payer: BLUE CROSS/BLUE SHIELD | Admitting: Obstetrics & Gynecology

## 2018-02-23 ENCOUNTER — Encounter: Payer: Self-pay | Admitting: Obstetrics & Gynecology

## 2018-02-23 ENCOUNTER — Ambulatory Visit (INDEPENDENT_AMBULATORY_CARE_PROVIDER_SITE_OTHER): Payer: BLUE CROSS/BLUE SHIELD | Admitting: Obstetrics & Gynecology

## 2018-02-23 VITALS — BP 118/80 | Ht 73.0 in | Wt 225.0 lb

## 2018-02-23 DIAGNOSIS — N816 Rectocele: Secondary | ICD-10-CM

## 2018-02-23 DIAGNOSIS — R102 Pelvic and perineal pain: Secondary | ICD-10-CM | POA: Diagnosis not present

## 2018-02-23 DIAGNOSIS — N809 Endometriosis, unspecified: Secondary | ICD-10-CM | POA: Diagnosis not present

## 2018-02-23 DIAGNOSIS — N83201 Unspecified ovarian cyst, right side: Secondary | ICD-10-CM | POA: Diagnosis not present

## 2018-02-23 DIAGNOSIS — N83202 Unspecified ovarian cyst, left side: Secondary | ICD-10-CM

## 2018-02-23 DIAGNOSIS — G8929 Other chronic pain: Secondary | ICD-10-CM

## 2018-02-23 NOTE — Addendum Note (Signed)
Addended by: Gae Dry on: 02/23/2018 04:25 PM   Modules accepted: Miquel Dunn

## 2018-02-23 NOTE — Progress Notes (Addendum)
PRE-OPERATIVE HISTORY AND PHYSICAL EXAM  HPI:  Caitlyn Harris is a 37 y.o. [id number] No LMP recorded. Patient has had a hysterectomy.; she is being admitted for surgery related to endometriosis and pelvic pain, recently found to have cysts on both ovaries.  Concerned about recurremce of endometriosis after hysterectomy.  PMHx: Past Medical History:  Diagnosis Date  . Anxiety   . Depression   . GERD (gastroesophageal reflux disease)    OCC-   . Hyperlipidemia   . Migraine   . Pre-eclampsia   . Seizures ([place])    FEBRILE AS A BABY   Past Surgical History:  Procedure Laterality Date  . ABDOMINAL HYSTERECTOMY    . CERVICAL BIOPSY  W/ LOOP ELECTRODE EXCISION    . ECTOPIC PREGNANCY SURGERY    . endometirosis    . HAND SURGERY     THUMB SURGERY  . LEEP    . LEEP    . VENTRAL HERNIA REPAIR N/A 03/19/2017   Procedure: HERNIA REPAIR VENTRAL ADULT;  Surgeon: Florene Glen, MD;  Location: [facility] ORS;  Service: General;  Laterality: N/A;   Family History  Problem Relation Age of Onset  . Hypertension Mother   . Renal cancer Father   . Hypertension Father    Social History   Tobacco Use  . Smoking status: Former Smoker    Years: 7.00    Types: Cigarettes    Last attempt to quit: 03/12/2009    Years since quitting: 8.9  . Smokeless tobacco: Never Used  . Tobacco comment: 1 PACK EVERY 2 WEEKS  Substance Use Topics  . Alcohol use: Yes    Comment: RARE  . Drug use: No    Current Outpatient Medications:  .  acetaminophen (TYLENOL) 500 MG tablet, Take 1,000 mg by mouth every 6 (six) hours as needed for moderate pain or headache., Disp: , Rfl:  .  citalopram (CELEXA) 40 MG tablet, TAKE 1 TABLET DAILY-BEDTIME (Patient taking differently: Take 40 mg by mouth at bedtime. ), Disp: 90 tablet, Rfl: 3 .  ibuprofen (ADVIL,MOTRIN) 200 MG tablet, Take 600 mg by mouth every 6 (six) hours as needed for headache or moderate pain. , Disp: , Rfl:  .  SUMAtriptan (IMITREX) 100 MG tablet,  Take 1/2 to 1 tablet at migraine onset. May repeat in 2 hours x 1 if needed. (Patient taking differently: Take 50-100 mg by mouth every 2 (two) hours as needed for migraine or headache. May repeat in 2 hours x 1 if needed.), Disp: 10 tablet, Rfl: 1 .  topiramate (TOPAMAX) 100 MG tablet, Take 1 tablet (100 mg total) by mouth 2 (two) times daily., Disp: 180 tablet, Rfl: 3 Allergies: Patient has no known allergies.  Review of Systems  Constitutional: Negative for chills, fever and malaise/fatigue.  HENT: Negative for congestion, sinus pain and sore throat.   Eyes: Negative for blurred vision and pain.  Respiratory: Negative for cough and wheezing.   Cardiovascular: Negative for chest pain and leg swelling.  Gastrointestinal: Negative for abdominal pain, constipation, diarrhea, heartburn, nausea and vomiting.  Genitourinary: Negative for dysuria, frequency, hematuria and urgency.  Musculoskeletal: Negative for back pain, joint pain, myalgias and neck pain.  Skin: Negative for itching and rash.  Neurological: Negative for dizziness, tremors and weakness.  Endo/Heme/Allergies: Does not bruise/bleed easily.  Psychiatric/Behavioral: Negative for depression. The patient is not nervous/anxious and does not have insomnia.     Objective: BP 118/80   Ht 6\' 1"  (1.854 m)  Wt 225 lb (102.1 kg)   BMI 29.69 kg/m   Filed Weights   02/23/18 1424  Weight: 225 lb (102.1 kg)   Physical Exam  Constitutional: She is oriented to person, place, and time. She appears well-developed and well-nourished. No distress.  HENT:  Head: Normocephalic and atraumatic. Head is without laceration.  Right Ear: Hearing normal.  Left Ear: Hearing normal.  Nose: No epistaxis.  No foreign bodies.  Mouth/Throat: Uvula is midline, oropharynx is clear and moist and mucous membranes are normal.  Eyes: Pupils are equal, round, and reactive to light.  Neck: Normal range of motion. Neck supple. No thyromegaly present.    Cardiovascular: Normal rate and regular rhythm. Exam reveals no gallop and no friction rub.  No murmur heard. Pulmonary/Chest: Effort normal and breath sounds normal. No respiratory distress. She has no wheezes. Right breast exhibits no mass, no skin change and no tenderness. Left breast exhibits no mass, no skin change and no tenderness.  Abdominal: Soft. Bowel sounds are normal. She exhibits no distension. There is no tenderness. There is no rebound.  Musculoskeletal: Normal range of motion.  Neurological: She is alert and oriented to person, place, and time. No cranial nerve deficit.  Skin: Skin is warm and dry.  Psychiatric: She has a normal mood and affect. Judgment normal.  Vitals reviewed.   Assessment: 1. Endometriosis   2. Bilateral ovarian cysts   3. Chronic female pelvic pain   4. 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  I have had a careful discussion with this patient about all the options available and the risk/benefits of each. I have fully informed this patient that surgery may subject her to a variety of discomforts and risks: She understands that most patients have surgery with little difficulty, but problems can happen ranging from minor to fatal. These include nausea, vomiting, pain, bleeding, infection, poor healing, hernia, or formation of adhesions. Unexpected reactions may occur from any drug or anesthetic given. Unintended injury may occur to other pelvic or abdominal structures such as Fallopian tubes, ovaries, bladder, ureter (tube from kidney to bladder), or bowel. Nerves going from the pelvis to the legs may be injured. Any such injury may require immediate or later additional surgery to correct the problem. Excessive blood  loss requiring transfusion is very unlikely but possible. Dangerous blood clots may form in the legs or lungs. Physical and sexual activity will be restricted in varying degrees for an indeterminate period of time but most often 2-6 weeks.  Finally, she understands that it is impossible to list every possible undesirable effect and that the condition for which surgery is done is not always cured or significantly improved, and in rare cases may be even worse.Ample time was given to answer all questions.  Barnett Applebaum, MD, [facility], [facility] 02/23/2018  3:03 PM

## 2018-02-23 NOTE — Patient Instructions (Signed)
General Gynecological Post-Operative Instructions You may expect to feel dizzy, weak, and drowsy for as long as 24 hours after receiving the medicine that made you sleep (anesthetic).  Do not drive a car, ride a bicycle, participate in physical activities, or take public transportation until you are done taking narcotic pain medicines or as directed by your doctor.  Do not drink alcohol or take tranquilizers.  Do not take medicine that has not been prescribed by your doctor.  Do not sign important papers or make important decisions while on narcotic pain medicines.  Have a responsible person with you.  CARE OF INCISION  Take showers instead of baths until your doctor gives you permission to take baths.  No sexual intercourse or placement of anything in the vagina for 1 weeks or as instructed by your doctor. Only take prescription or over-the-counter medicines  for pain, discomfort, or fever as directed by your doctor. Do not take aspirin. It can make you bleed. Take medicines (antibiotics) that kill germs if they are prescribed for you.  Call the office or go to the ER if:  You feel sick to your stomach (nauseous) and you start to throw up (vomit).  You have trouble eating or drinking.  You have an oral temperature above 101.  You have constipation that is not helped by adjusting diet or increasing fluid intake. Pain medicines are a common cause of constipation.  You have any other concerns. SEEK IMMEDIATE MEDICAL CARE IF:  You have persistent dizziness.  You have difficulty breathing or a congested sounding (croupy) cough.  You have an oral temperature above 102.5, not controlled by medicine.  There is increasing pain or tenderness near or in the surgical site.

## 2018-02-23 NOTE — H&P (View-Only) (Signed)
PRE-OPERATIVE HISTORY AND PHYSICAL EXAM  HPI:  Caitlyn Harris is a 37 y.o. [id number] No LMP recorded. Patient has had a hysterectomy.; she is being admitted for surgery related to endometriosis and pelvic pain, recently found to have cysts on both ovaries.  Concerned about recurremce of endometriosis after hysterectomy.  PMHx: Past Medical History:  Diagnosis Date  . Anxiety   . Depression   . GERD (gastroesophageal reflux disease)    OCC-   . Hyperlipidemia   . Migraine   . Pre-eclampsia   . Seizures ([place])    FEBRILE AS A BABY   Past Surgical History:  Procedure Laterality Date  . ABDOMINAL HYSTERECTOMY    . CERVICAL BIOPSY  W/ LOOP ELECTRODE EXCISION    . ECTOPIC PREGNANCY SURGERY    . endometirosis    . HAND SURGERY     THUMB SURGERY  . LEEP    . LEEP    . VENTRAL HERNIA REPAIR N/A 03/19/2017   Procedure: HERNIA REPAIR VENTRAL ADULT;  Surgeon: Florene Glen, MD;  Location: [facility] ORS;  Service: General;  Laterality: N/A;   Family History  Problem Relation Age of Onset  . Hypertension Mother   . Renal cancer Father   . Hypertension Father    Social History   Tobacco Use  . Smoking status: Former Smoker    Years: 7.00    Types: Cigarettes    Last attempt to quit: 03/12/2009    Years since quitting: 8.9  . Smokeless tobacco: Never Used  . Tobacco comment: 1 PACK EVERY 2 WEEKS  Substance Use Topics  . Alcohol use: Yes    Comment: RARE  . Drug use: No    Current Outpatient Medications:  .  acetaminophen (TYLENOL) 500 MG tablet, Take 1,000 mg by mouth every 6 (six) hours as needed for moderate pain or headache., Disp: , Rfl:  .  citalopram (CELEXA) 40 MG tablet, TAKE 1 TABLET DAILY-BEDTIME (Patient taking differently: Take 40 mg by mouth at bedtime. ), Disp: 90 tablet, Rfl: 3 .  ibuprofen (ADVIL,MOTRIN) 200 MG tablet, Take 600 mg by mouth every 6 (six) hours as needed for headache or moderate pain. , Disp: , Rfl:  .  SUMAtriptan (IMITREX) 100 MG tablet,  Take 1/2 to 1 tablet at migraine onset. May repeat in 2 hours x 1 if needed. (Patient taking differently: Take 50-100 mg by mouth every 2 (two) hours as needed for migraine or headache. May repeat in 2 hours x 1 if needed.), Disp: 10 tablet, Rfl: 1 .  topiramate (TOPAMAX) 100 MG tablet, Take 1 tablet (100 mg total) by mouth 2 (two) times daily., Disp: 180 tablet, Rfl: 3 Allergies: Patient has no known allergies.  Review of Systems  Constitutional: Negative for chills, fever and malaise/fatigue.  HENT: Negative for congestion, sinus pain and sore throat.   Eyes: Negative for blurred vision and pain.  Respiratory: Negative for cough and wheezing.   Cardiovascular: Negative for chest pain and leg swelling.  Gastrointestinal: Negative for abdominal pain, constipation, diarrhea, heartburn, nausea and vomiting.  Genitourinary: Negative for dysuria, frequency, hematuria and urgency.  Musculoskeletal: Negative for back pain, joint pain, myalgias and neck pain.  Skin: Negative for itching and rash.  Neurological: Negative for dizziness, tremors and weakness.  Endo/Heme/Allergies: Does not bruise/bleed easily.  Psychiatric/Behavioral: Negative for depression. The patient is not nervous/anxious and does not have insomnia.     Objective: BP 118/80   Ht 6\' 1"  (1.854 m)  Wt 225 lb (102.1 kg)   BMI 29.69 kg/m   Filed Weights   02/23/18 1424  Weight: 225 lb (102.1 kg)   Physical Exam  Constitutional: She is oriented to person, place, and time. She appears well-developed and well-nourished. No distress.  HENT:  Head: Normocephalic and atraumatic. Head is without laceration.  Right Ear: Hearing normal.  Left Ear: Hearing normal.  Nose: No epistaxis.  No foreign bodies.  Mouth/Throat: Uvula is midline, oropharynx is clear and moist and mucous membranes are normal.  Eyes: Pupils are equal, round, and reactive to light.  Neck: Normal range of motion. Neck supple. No thyromegaly present.    Cardiovascular: Normal rate and regular rhythm. Exam reveals no gallop and no friction rub.  No murmur heard. Pulmonary/Chest: Effort normal and breath sounds normal. No respiratory distress. She has no wheezes. Right breast exhibits no mass, no skin change and no tenderness. Left breast exhibits no mass, no skin change and no tenderness.  Abdominal: Soft. Bowel sounds are normal. She exhibits no distension. There is no tenderness. There is no rebound.  Musculoskeletal: Normal range of motion.  Neurological: She is alert and oriented to person, place, and time. No cranial nerve deficit.  Skin: Skin is warm and dry.  Psychiatric: She has a normal mood and affect. Judgment normal.  Vitals reviewed.   Assessment: 1. Endometriosis   2. Bilateral ovarian cysts   3. Chronic female pelvic pain   4. 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  I have had a careful discussion with this patient about all the options available and the risk/benefits of each. I have fully informed this patient that surgery may subject her to a variety of discomforts and risks: She understands that most patients have surgery with little difficulty, but problems can happen ranging from minor to fatal. These include nausea, vomiting, pain, bleeding, infection, poor healing, hernia, or formation of adhesions. Unexpected reactions may occur from any drug or anesthetic given. Unintended injury may occur to other pelvic or abdominal structures such as Fallopian tubes, ovaries, bladder, ureter (tube from kidney to bladder), or bowel. Nerves going from the pelvis to the legs may be injured. Any such injury may require immediate or later additional surgery to correct the problem. Excessive blood  loss requiring transfusion is very unlikely but possible. Dangerous blood clots may form in the legs or lungs. Physical and sexual activity will be restricted in varying degrees for an indeterminate period of time but most often 2-6 weeks.  Finally, she understands that it is impossible to list every possible undesirable effect and that the condition for which surgery is done is not always cured or significantly improved, and in rare cases may be even worse.Ample time was given to answer all questions.  Barnett Applebaum, MD, [facility], [facility] 02/23/2018  3:03 PM

## 2018-02-24 ENCOUNTER — Other Ambulatory Visit: Payer: Self-pay

## 2018-02-24 ENCOUNTER — Encounter
Admission: RE | Admit: 2018-02-24 | Discharge: 2018-02-24 | Disposition: A | Discharge status: Home / Self Care | Payer: BLUE CROSS/BLUE SHIELD | Source: Ambulatory Visit | Attending: Obstetrics & Gynecology | Admitting: Obstetrics & Gynecology

## 2018-02-24 NOTE — Patient Instructions (Signed)
Your procedure is scheduled on: 03-02-18 TUESDAY Report to Same Day Surgery 2nd floor medical mall [facility] Entrance-take elevator on left to 2nd floor.  Check in with surgery information desk.) To find out your arrival time please call [phone] between 1PM - 3PM on 03-01-18 MONDAY  Remember: Instructions that are not followed completely may result in serious medical risk, up to and including death, or upon the discretion of your surgeon and anesthesiologist your surgery may need to be rescheduled.    _x___ 1. Do not eat food after midnight the night before your procedure. NO GUM OR CANDY AFTER MIDNIGHT.  You may drink clear liquids up to 2 hours before you are scheduled to arrive at the hospital for your procedure.  Do not drink clear liquids within 2 hours of your scheduled arrival to the hospital.  Clear liquids include  --Water or Apple juice without pulp  --Clear carbohydrate beverage such as ClearFast or Gatorade  --Black Coffee or Clear Tea (No milk, no creamers, do not add anything to the coffee or Tea   ____Ensure clear carbohydrate drink on the way to the hospital for bariatric patients  ____Ensure clear carbohydrate drink 3 hours before surgery for Dr Dwyane Luo patients if physician instructed.    __x__ 2. No Alcohol for 24 hours before or after surgery.   __x__3. No Smoking or e-cigarettes for 24 prior to surgery.  Do not use any chewable tobacco products for at least 6 hour prior to surgery   ____  4. Bring all medications with you on the day of surgery if instructed.    __x__ 5. Notify your doctor if there is any change in your medical condition     (cold, fever, infections).    x___6. On the morning of surgery brush your teeth with toothpaste and water.  You may rinse your mouth with mouth wash if you wish.  Do not swallow any toothpaste or mouthwash.   Do not wear jewelry, make-up, hairpins, clips or nail polish.  Do not wear lotions, powders, or perfumes.  You may wear deodorant.  Do not shave 48 hours prior to surgery. Men may shave face and neck.  Do not bring valuables to the hospital.    [facility] is not responsible for any belongings or valuables.               Contacts, dentures or bridgework may not be worn into surgery.  Leave your suitcase in the car. After surgery it may be brought to your room.  For patients admitted to the hospital, discharge time is determined by your treatment team.  _  Patients discharged the day of surgery will not be allowed to drive home.  You will need someone to drive you home and stay with you the night of your procedure.    Please read over the following fact sheets that you were given:   [facility] Preparing for Surgery and or MRSA Information   _x___ TAKE THE FOLLOWING MEDICATIONS THE MORNING OF SURGERY WITH A SMALL SIP OF WATER. These include:  1. TOPAMAX (TOPIRAMATE)  2. PRILOSEC (OMEPRAZOLE)  3. TAKE AN EXTRA PRILOSEC THE NIGHT BEFORE YOUR SURGERY  4.  5.  6.  ____Fleets enema or Magnesium Citrate as directed.   _x___ Use CHG Soap or sage wipes as directed on instruction sheet   ____ Use inhalers on the day of surgery and bring to hospital day of surgery  ____ Stop Metformin and Janumet 2 days prior  to surgery.    ____ Take 1/2 of usual insulin dose the night before surgery and none on the morning surgery.   ____ Follow recommendations from Cardiologist, Pulmonologist or PCP regarding stopping Aspirin, Coumadin, Plavix ,Eliquis, Effient, or Pradaxa, and Pletal.  X____Stop Anti-inflammatories such as Advil, Aleve, Ibuprofen, Motrin, Naproxen, Naprosyn, Goodies powders or aspirin products NOW-OK to take Tylenol    ____ Stop supplements until after surgery.     ____ Bring C-Pap to the hospital.

## 2018-02-26 ENCOUNTER — Encounter
Admission: RE | Admit: 2018-02-26 | Discharge: 2018-02-26 | Disposition: A | Discharge status: Home / Self Care | Payer: BLUE CROSS/BLUE SHIELD | Source: Ambulatory Visit | Attending: Obstetrics & Gynecology | Admitting: Obstetrics & Gynecology

## 2018-02-26 DIAGNOSIS — Z01812 Encounter for preprocedural laboratory examination: Secondary | ICD-10-CM | POA: Diagnosis not present

## 2018-02-26 LAB — CBC
HCT: 40.8 % (ref 36.0–46.0)
HEMOGLOBIN: 14.2 g/dL (ref 12.0–15.0)
MCH: 30.9 pg (ref 26.0–34.0)
MCHC: 34.8 g/dL (ref 30.0–36.0)
MCV: 88.7 fL (ref 80.0–100.0)
Platelets: 164 10*3/uL (ref 150–400)
RBC: 4.6 MIL/uL (ref 3.87–5.11)
RDW: 12.1 % (ref 11.5–15.5)
WBC: 6.6 10*3/uL (ref 4.0–10.5)
nRBC: 0 % (ref 0.0–0.2)

## 2018-02-26 LAB — TYPE AND SCREEN
ABO/RH(D): O NEG
Antibody Screen: NEGATIVE

## 2018-03-01 MED ORDER — CEFAZOLIN SODIUM-DEXTROSE 2-4 GM/100ML-% IV SOLN
2.0000 g | Freq: Once | INTRAVENOUS | Status: AC
  Administered 2018-03-02: 2 g via INTRAVENOUS

## 2018-03-02 ENCOUNTER — Ambulatory Visit: Payer: BLUE CROSS/BLUE SHIELD | Admitting: Certified Registered"

## 2018-03-02 ENCOUNTER — Encounter: Payer: Self-pay | Admitting: *Deleted

## 2018-03-02 ENCOUNTER — Ambulatory Visit
Admission: RE | Admit: 2018-03-02 | Discharge: 2018-03-02 | Disposition: A | Discharge status: Home / Self Care | Payer: BLUE CROSS/BLUE SHIELD | Source: Ambulatory Visit | Attending: Obstetrics & Gynecology | Admitting: Obstetrics & Gynecology

## 2018-03-02 ENCOUNTER — Telehealth: Payer: Self-pay

## 2018-03-02 ENCOUNTER — Other Ambulatory Visit: Payer: Self-pay

## 2018-03-02 ENCOUNTER — Encounter
Admission: RE | Disposition: A | Discharge status: Home / Self Care | Payer: Self-pay | Source: Ambulatory Visit | Attending: Obstetrics & Gynecology

## 2018-03-02 DIAGNOSIS — F419 Anxiety disorder, unspecified: Secondary | ICD-10-CM | POA: Diagnosis not present

## 2018-03-02 DIAGNOSIS — R102 Pelvic and perineal pain: Secondary | ICD-10-CM | POA: Diagnosis not present

## 2018-03-02 DIAGNOSIS — F329 Major depressive disorder, single episode, unspecified: Secondary | ICD-10-CM | POA: Diagnosis not present

## 2018-03-02 DIAGNOSIS — K219 Gastro-esophageal reflux disease without esophagitis: Secondary | ICD-10-CM | POA: Diagnosis not present

## 2018-03-02 DIAGNOSIS — Z87891 Personal history of nicotine dependence: Secondary | ICD-10-CM | POA: Insufficient documentation

## 2018-03-02 DIAGNOSIS — G43909 Migraine, unspecified, not intractable, without status migrainosus: Secondary | ICD-10-CM | POA: Insufficient documentation

## 2018-03-02 DIAGNOSIS — N8311 Corpus luteum cyst of right ovary: Secondary | ICD-10-CM | POA: Insufficient documentation

## 2018-03-02 DIAGNOSIS — Z791 Long term (current) use of non-steroidal anti-inflammatories (NSAID): Secondary | ICD-10-CM | POA: Insufficient documentation

## 2018-03-02 DIAGNOSIS — G8929 Other chronic pain: Secondary | ICD-10-CM | POA: Diagnosis present

## 2018-03-02 DIAGNOSIS — E785 Hyperlipidemia, unspecified: Secondary | ICD-10-CM | POA: Diagnosis not present

## 2018-03-02 DIAGNOSIS — N83201 Unspecified ovarian cyst, right side: Secondary | ICD-10-CM | POA: Diagnosis present

## 2018-03-02 DIAGNOSIS — Z90710 Acquired absence of both cervix and uterus: Secondary | ICD-10-CM | POA: Diagnosis not present

## 2018-03-02 DIAGNOSIS — N809 Endometriosis, unspecified: Secondary | ICD-10-CM | POA: Diagnosis not present

## 2018-03-02 DIAGNOSIS — N83202 Unspecified ovarian cyst, left side: Secondary | ICD-10-CM

## 2018-03-02 DIAGNOSIS — Z79899 Other long term (current) drug therapy: Secondary | ICD-10-CM | POA: Insufficient documentation

## 2018-03-02 DIAGNOSIS — N816 Rectocele: Secondary | ICD-10-CM | POA: Diagnosis present

## 2018-03-02 HISTORY — PX: LAPAROSCOPIC OVARIAN CYSTECTOMY: SHX6248

## 2018-03-02 HISTORY — PX: PERINEOPLASTY: SHX2218

## 2018-03-02 HISTORY — PX: RECTOCELE REPAIR: SHX761

## 2018-03-02 LAB — ABO/RH: ABO/RH(D): O NEG

## 2018-03-02 SURGERY — EXCISION, CYST, OVARY, LAPAROSCOPIC
Anesthesia: General | Site: Abdomen

## 2018-03-02 MED ORDER — SUGAMMADEX SODIUM 200 MG/2ML IV SOLN
INTRAVENOUS | Status: DC | PRN
  Administered 2018-03-02: 204.4 mg via INTRAVENOUS

## 2018-03-02 MED ORDER — ONDANSETRON HCL 4 MG/2ML IJ SOLN
INTRAMUSCULAR | Status: DC | PRN
  Administered 2018-03-02: 4 mg via INTRAVENOUS

## 2018-03-02 MED ORDER — HYDROMORPHONE HCL 1 MG/ML IJ SOLN
0.2500 mg | INTRAMUSCULAR | Status: DC | PRN
  Administered 2018-03-02 (×4): 0.25 mg via INTRAVENOUS

## 2018-03-02 MED ORDER — ACETAMINOPHEN 325 MG PO TABS: 650.0000 mg | ORAL_TABLET | ORAL | Status: DC | PRN

## 2018-03-02 MED ORDER — MIDAZOLAM HCL 2 MG/2ML IJ SOLN
INTRAMUSCULAR | Status: AC
  Filled 2018-03-02: qty 2

## 2018-03-02 MED ORDER — SCOPOLAMINE 1 MG/3DAYS TD PT72
MEDICATED_PATCH | TRANSDERMAL | Status: AC
  Filled 2018-03-02: qty 1

## 2018-03-02 MED ORDER — EPHEDRINE SULFATE 50 MG/ML IJ SOLN
INTRAMUSCULAR | Status: DC | PRN
  Administered 2018-03-02: 5 mg via INTRAVENOUS

## 2018-03-02 MED ORDER — PROPOFOL 500 MG/50ML IV EMUL
INTRAVENOUS | Status: AC
  Filled 2018-03-02: qty 50

## 2018-03-02 MED ORDER — BUPIVACAINE HCL (PF) 0.5 % IJ SOLN
INTRAMUSCULAR | Status: AC
  Filled 2018-03-02: qty 30

## 2018-03-02 MED ORDER — PROPOFOL 500 MG/50ML IV EMUL
INTRAVENOUS | Status: DC | PRN
  Administered 2018-03-02: 50 ug/kg/min via INTRAVENOUS

## 2018-03-02 MED ORDER — GLYCOPYRROLATE 0.2 MG/ML IJ SOLN
INTRAMUSCULAR | Status: DC | PRN
  Administered 2018-03-02: 0.1 mg via INTRAVENOUS

## 2018-03-02 MED ORDER — ESTROGENS, CONJUGATED 0.625 MG/GM VA CREA
TOPICAL_CREAM | VAGINAL | Status: AC
  Filled 2018-03-02: qty 30

## 2018-03-02 MED ORDER — ACETAMINOPHEN 650 MG RE SUPP
650.0000 mg | RECTAL | Status: DC | PRN
  Filled 2018-03-02: qty 1

## 2018-03-02 MED ORDER — DEXAMETHASONE SODIUM PHOSPHATE 10 MG/ML IJ SOLN
INTRAMUSCULAR | Status: AC
  Filled 2018-03-02: qty 1

## 2018-03-02 MED ORDER — OXYCODONE-ACETAMINOPHEN 5-325 MG PO TABS
1.0000 | ORAL_TABLET | ORAL | Status: DC | PRN
  Administered 2018-03-02: 1 via ORAL

## 2018-03-02 MED ORDER — ROCURONIUM BROMIDE 50 MG/5ML IV SOLN
INTRAVENOUS | Status: AC
  Filled 2018-03-02: qty 1

## 2018-03-02 MED ORDER — HYDROMORPHONE HCL 1 MG/ML IJ SOLN
INTRAMUSCULAR | Status: AC
  Administered 2018-03-02: 0.25 mg via INTRAVENOUS
  Filled 2018-03-02: qty 1

## 2018-03-02 MED ORDER — ONDANSETRON HCL 4 MG/2ML IJ SOLN
INTRAMUSCULAR | Status: AC
  Filled 2018-03-02: qty 2

## 2018-03-02 MED ORDER — CEFAZOLIN SODIUM-DEXTROSE 2-4 GM/100ML-% IV SOLN
INTRAVENOUS | Status: AC
  Filled 2018-03-02: qty 100

## 2018-03-02 MED ORDER — LACTATED RINGERS IV SOLN: INTRAVENOUS | Status: DC

## 2018-03-02 MED ORDER — LIDOCAINE-EPINEPHRINE 1 %-1:100000 IJ SOLN
INTRAMUSCULAR | Status: AC
  Filled 2018-03-02: qty 1

## 2018-03-02 MED ORDER — DEXMEDETOMIDINE HCL 200 MCG/2ML IV SOLN
INTRAVENOUS | Status: DC | PRN
  Administered 2018-03-02 (×2): 4 ug via INTRAVENOUS

## 2018-03-02 MED ORDER — OXYCODONE-ACETAMINOPHEN 5-325 MG PO TABS: 1.0000 | ORAL_TABLET | ORAL | 0 refills | Status: DC | PRN

## 2018-03-02 MED ORDER — SUGAMMADEX SODIUM 200 MG/2ML IV SOLN
INTRAVENOUS | Status: AC
  Filled 2018-03-02: qty 4

## 2018-03-02 MED ORDER — KETOROLAC TROMETHAMINE 30 MG/ML IJ SOLN
30.0000 mg | Freq: Four times a day (QID) | INTRAMUSCULAR | Status: DC
  Administered 2018-03-02: 30 mg via INTRAVENOUS
  Filled 2018-03-02: qty 1

## 2018-03-02 MED ORDER — LIDOCAINE HCL (CARDIAC) PF 100 MG/5ML IV SOSY
PREFILLED_SYRINGE | INTRAVENOUS | Status: DC | PRN
  Administered 2018-03-02: 100 mg via INTRAVENOUS

## 2018-03-02 MED ORDER — MIDAZOLAM HCL 2 MG/2ML IJ SOLN
INTRAMUSCULAR | Status: DC | PRN
  Administered 2018-03-02: 2 mg via INTRAVENOUS

## 2018-03-02 MED ORDER — FENTANYL CITRATE (PF) 250 MCG/5ML IJ SOLN
INTRAMUSCULAR | Status: AC
  Filled 2018-03-02: qty 5

## 2018-03-02 MED ORDER — BUPIVACAINE HCL (PF) 0.5 % IJ SOLN
INTRAMUSCULAR | Status: DC | PRN
  Administered 2018-03-02: 10 mL

## 2018-03-02 MED ORDER — FENTANYL CITRATE (PF) 100 MCG/2ML IJ SOLN
INTRAMUSCULAR | Status: DC | PRN
  Administered 2018-03-02: 100 ug via INTRAVENOUS
  Administered 2018-03-02: 50 ug via INTRAVENOUS

## 2018-03-02 MED ORDER — ROCURONIUM BROMIDE 100 MG/10ML IV SOLN
INTRAVENOUS | Status: DC | PRN
  Administered 2018-03-02: 50 mg via INTRAVENOUS
  Administered 2018-03-02: 20 mg via INTRAVENOUS

## 2018-03-02 MED ORDER — DEXAMETHASONE SODIUM PHOSPHATE 10 MG/ML IJ SOLN
INTRAMUSCULAR | Status: DC | PRN
  Administered 2018-03-02: 10 mg via INTRAVENOUS

## 2018-03-02 MED ORDER — MORPHINE SULFATE (PF) 4 MG/ML IV SOLN: 1.0000 mg | INTRAVENOUS | Status: DC | PRN

## 2018-03-02 MED ORDER — PROPOFOL 10 MG/ML IV BOLUS
INTRAVENOUS | Status: DC | PRN
  Administered 2018-03-02: 170 mg via INTRAVENOUS

## 2018-03-02 MED ORDER — LIDOCAINE-EPINEPHRINE 1 %-1:100000 IJ SOLN
INTRAMUSCULAR | Status: DC | PRN
  Administered 2018-03-02: 5 mL

## 2018-03-02 MED ORDER — EPHEDRINE SULFATE 50 MG/ML IJ SOLN
INTRAMUSCULAR | Status: AC
  Filled 2018-03-02: qty 1

## 2018-03-02 MED ORDER — SCOPOLAMINE 1 MG/3DAYS TD PT72
MEDICATED_PATCH | TRANSDERMAL | Status: DC | PRN
  Administered 2018-03-02: 1 via TRANSDERMAL

## 2018-03-02 MED ORDER — OXYCODONE-ACETAMINOPHEN 5-325 MG PO TABS
ORAL_TABLET | ORAL | Status: AC
  Administered 2018-03-02: 1 via ORAL
  Filled 2018-03-02: qty 1

## 2018-03-02 MED ORDER — LIDOCAINE HCL (PF) 2 % IJ SOLN
INTRAMUSCULAR | Status: AC
  Filled 2018-03-02: qty 10

## 2018-03-02 MED ORDER — KETOROLAC TROMETHAMINE 30 MG/ML IJ SOLN
INTRAMUSCULAR | Status: AC
  Administered 2018-03-02: 30 mg via INTRAVENOUS
  Filled 2018-03-02: qty 1

## 2018-03-02 MED ORDER — LACTATED RINGERS IV SOLN
INTRAVENOUS | Status: DC
  Administered 2018-03-02 (×2): via INTRAVENOUS

## 2018-03-02 MED ORDER — PROMETHAZINE HCL 25 MG/ML IJ SOLN: 12.5000 mg | Freq: Once | INTRAMUSCULAR | Status: DC | PRN

## 2018-03-02 SURGICAL SUPPLY — 65 items
BAG COUNTER SPONGE EZ (MISCELLANEOUS) ×5 IMPLANT
BAG URINE DRAINAGE (UROLOGICAL SUPPLIES) ×5 IMPLANT
BLADE SURG SZ10 CARB STEEL (BLADE) ×5 IMPLANT
BLADE SURG SZ11 CARB STEEL (BLADE) ×9 IMPLANT
CANISTER SUCT 1200ML W/VALVE (MISCELLANEOUS) ×5 IMPLANT
CATH FOLEY 2WAY  5CC 16FR (CATHETERS) ×1
CATH ROBINSON RED A/P 16FR (CATHETERS) ×5 IMPLANT
CATH URTH 16FR FL 2W BLN LF (CATHETERS) ×4 IMPLANT
CHLORAPREP W/TINT 26ML (MISCELLANEOUS) ×5 IMPLANT
COVER WAND RF STERILE (DRAPES) ×5 IMPLANT
CUP MEDICINE 2OZ PLAST GRAD ST (MISCELLANEOUS) ×2 IMPLANT
DERMABOND ADVANCED (GAUZE/BANDAGES/DRESSINGS) ×1
DERMABOND ADVANCED .7 DNX12 (GAUZE/BANDAGES/DRESSINGS) ×4 IMPLANT
DRAPE PERI LITHO V/GYN (MISCELLANEOUS) ×3 IMPLANT
DRAPE SHEET LG 3/4 BI-LAMINATE (DRAPES) ×3 IMPLANT
DRAPE UNDER BUTTOCK W/FLU (DRAPES) ×5 IMPLANT
DRSG TELFA 4X3 1S NADH ST (GAUZE/BANDAGES/DRESSINGS) IMPLANT
ELECT REM PT RETURN 9FT ADLT (ELECTROSURGICAL) ×4
ELECTRODE REM PT RTRN 9FT ADLT (ELECTROSURGICAL) ×4 IMPLANT
GAUZE 4X4 16PLY RFD (DISPOSABLE) ×3 IMPLANT
GAUZE PACK 2X3YD (MISCELLANEOUS) ×5 IMPLANT
GLOVE BIO SURGEON STRL SZ8 (GLOVE) ×10 IMPLANT
GLOVE INDICATOR 8.0 STRL GRN (GLOVE) ×11 IMPLANT
GOWN STRL REUS W/ TWL LRG LVL3 (GOWN DISPOSABLE) ×12 IMPLANT
GOWN STRL REUS W/ TWL XL LVL3 (GOWN DISPOSABLE) ×4 IMPLANT
GOWN STRL REUS W/TWL LRG LVL3 (GOWN DISPOSABLE) ×3
GOWN STRL REUS W/TWL XL LVL3 (GOWN DISPOSABLE) ×1
GRASPER SUT TROCAR 14GX15 (MISCELLANEOUS) ×3 IMPLANT
HANDLE YANKAUER SUCT BULB TIP (MISCELLANEOUS) ×2 IMPLANT
IRRIGATION STRYKERFLOW (MISCELLANEOUS) IMPLANT
IRRIGATOR STRYKERFLOW (MISCELLANEOUS)
IV LACTATED RINGERS 1000ML (IV SOLUTION) IMPLANT
KIT PINK PAD W/HEAD ARE REST (MISCELLANEOUS) ×4
KIT PINK PAD W/HEAD ARM REST (MISCELLANEOUS) ×4 IMPLANT
LABEL OR SOLS (LABEL) ×5 IMPLANT
NDL HPO THNWL 1X22GA REG BVL (NEEDLE) ×4 IMPLANT
NEEDLE SAFETY 22GX1 (NEEDLE) ×1
NEEDLE VERESS 14GA 120MM (NEEDLE) ×5 IMPLANT
NS IRRIG 500ML POUR BTL (IV SOLUTION) ×5 IMPLANT
PACK BASIN MINOR ARMC (MISCELLANEOUS) ×3 IMPLANT
PACK GYN LAPAROSCOPIC (MISCELLANEOUS) ×5 IMPLANT
PAD OB MATERNITY 4.3X12.25 (PERSONAL CARE ITEMS) ×5 IMPLANT
PAD PREP 24X41 OB/GYN DISP (PERSONAL CARE ITEMS) ×5 IMPLANT
POUCH SPECIMEN RETRIEVAL 10MM (ENDOMECHANICALS) IMPLANT
SCISSORS METZENBAUM CVD 33 (INSTRUMENTS) ×5 IMPLANT
SHEARS HARMONIC ACE PLUS 36CM (ENDOMECHANICALS) ×2 IMPLANT
SLEEVE ENDOPATH XCEL 5M (ENDOMECHANICALS) ×4 IMPLANT
SPONGE GAUZE 2X2 8PLY STRL LF (GAUZE/BANDAGES/DRESSINGS) IMPLANT
STRAP SAFETY 5IN WIDE (MISCELLANEOUS) ×5 IMPLANT
SUT ETHIBOND NAB CT1 #1 30IN (SUTURE) ×14 IMPLANT
SUT VIC AB 0 CT1 27 (SUTURE) ×1
SUT VIC AB 0 CT1 27XCR 8 STRN (SUTURE) ×4 IMPLANT
SUT VIC AB 0 CT1 36 (SUTURE) ×5 IMPLANT
SUT VIC AB 2-0 CT1 (SUTURE) ×3 IMPLANT
SUT VIC AB 2-0 CT1 36 (SUTURE) ×2 IMPLANT
SUT VIC AB 2-0 UR6 27 (SUTURE) IMPLANT
SUT VIC AB 3-0 SH 27 (SUTURE)
SUT VIC AB 3-0 SH 27X BRD (SUTURE) ×3 IMPLANT
SUT VIC AB 4-0 PS2 18 (SUTURE) IMPLANT
SYR 10ML LL (SYRINGE) ×5 IMPLANT
SYR BULB IRRIG 60ML STRL (SYRINGE) ×3 IMPLANT
SYR CONTROL 10ML (SYRINGE) ×5 IMPLANT
TROCAR ENDO BLADELESS 11MM (ENDOMECHANICALS) IMPLANT
TROCAR XCEL NON-BLD 5MMX100MML (ENDOMECHANICALS) ×5 IMPLANT
TUBING INSUFFLATION (TUBING) ×5 IMPLANT

## 2018-03-02 NOTE — Telephone Encounter (Signed)
FMLA/DISABILITY forms (2) for [employer] filled out, signature obtained and given to [place] for processing.

## 2018-03-02 NOTE — Op Note (Signed)
  Operative Note   03/02/2018  PRE-OP DIAGNOSIS: Bilateral Ovarian Cysts, Pelvic Pain, Rectocele   POST-OP DIAGNOSIS: Right Ovarian Cyst, Pelvic Pain, Rectocele   PROCEDURE: Procedure(s): LAPAROSCOPIC RIGHT OVARIAN CYSTECTOMY POSTERIOR REPAIR (RECTOCELE) PERINEORRHAPHY   SURGEON: Barnett Applebaum, MD, FACOG  ANESTHESIA: General   ESTIMATED BLOOD LOSS: 10 mL  COMPLICATIONS: None  DISPOSITION: PACU - hemodynamically stable.  CONDITION: stable  FINDINGS: Laparoscopic survey of the abdomen revealed absent uterus and tubes, small normal left ovary and cystic right ovary, liver edge, gallbladder edge and appendix, No intra-abdominal adhesions were noted nor evidence for endometriosis. Ovarian Cyst noted to be clear fluid filled, not an endometrioma.  Rectocele noted and repaired.  PROCEDURE IN DETAIL: The patient was taken to the OR where anesthesia was administed. The patient was positioned in dorsal lithotomy in the [place]. The patient was then examined under anesthesia with the above noted findings. The patient was prepped and draped in the normal sterile fashion and foley catheter was placed.   Attention was turned to the patient's abdomen where a 5 mm skin incision was made in the umbilical fold, after injection of local anesthesia. The Veress step needle was carefully introduced into the peritoneal cavity with placement confirmed using the hanging drop technique.  Pneumoperitoneum was obtained. The 5 mm port was then placed under direct visualization with the operative laparoscope.  Trendelenburg positioning.  Additional 64mm trocar was then placed in the RLQ lateral to the inferior epigastric blood vessels under direct visualization with the laparoscope.  Instrumentation to visualize complete pelvic anatomy performed.  A 6mm trocar was also then placed in the suprapubic region.  The ovarian cyst is identified and stabilized.  An incision is made with clear fluid noted.  Fluid is  aspirated.  Cyst wall is dissected free from the ovarian cortex and removed.  Hemostasis is visualized and assured.  Contralateral ovary seen as normal.  Pelvic cavity is cleaned with any fluid aspirated.  Instruments and trocars removed, gas expelled, and skin closed with skin adhesive glue.   Posterior colporrhaphy is performed.  Allis clamps are placed along the posterior vaginal wall, lidocaine is used to infiltrate the plane, and incision is made midline vertical.  Endopelvic fascia is dissected free of vaginal mucosa.  Fascia is plicated w interrupted vicryl sutures.  Ethibond suture also used at the introitus.  Excess mucosa is excised.  Vaginal incision is closed with a running locking Vicryl suture.  Perineorrhaphy is performed on the external skin as well as to strengthen and plicate the perineal body.    Excellent hemostasis was noted at the end of the case.   Instrument, needle, and sponge counts correct x2 at the conclusion of the case.  Pt goes to recovery room in stable condition.  Barnett Applebaum, MD, [facility], [facility] 03/02/2018  3:28 PM

## 2018-03-02 NOTE — Anesthesia Procedure Notes (Signed)
Procedure Name: Intubation Date/Time: 03/02/2018 1:49 PM Performed by: Lavone Orn, CRNA Pre-anesthesia Checklist: Patient identified, Emergency Drugs available, Suction available, Patient being monitored and Timeout performed Patient Re-evaluated:Patient Re-evaluated prior to induction Oxygen Delivery Method: Circle system utilized Preoxygenation: Pre-oxygenation with 100% oxygen Induction Type: IV induction Ventilation: Mask ventilation without difficulty Laryngoscope Size: Mac and 4 Grade View: Grade I Tube type: Oral Tube size: 7.0 mm Number of attempts: 1 Airway Equipment and Method: Stylet Placement Confirmation: ETT inserted through vocal cords under direct vision,  positive ETCO2 and breath sounds checked- equal and bilateral Secured at: 23 cm Tube secured with: Tape Dental Injury: Teeth and Oropharynx as per pre-operative assessment

## 2018-03-02 NOTE — Discharge Instructions (Signed)
General Gynecological Post-Operative Instructions You may expect to feel dizzy, weak, and drowsy for as long as 24 hours after receiving the medicine that made you sleep (anesthetic).  Do not drive a car, ride a bicycle, participate in physical activities, or take public transportation until you are done taking narcotic pain medicines or as directed by your doctor.  Do not drink alcohol or take tranquilizers.  Do not take medicine that has not been prescribed by your doctor.  Do not sign important papers or make important decisions while on narcotic pain medicines.  Have a responsible person with you.  CARE OF INCISION  Keep incision clean and dry. Take showers instead of baths until your doctor gives you permission to take baths.  Avoid heavy lifting (more than 10 pounds/4.5 kilograms), pushing, or pulling.  Avoid activities that may risk injury to your surgical site.  No sexual intercourse or placement of anything in the vagina for 6 weeks or as instructed by your doctor. If you have tubes coming from the wound site, check with your doctor regarding appropriate care of the tubes. Only take prescription or over-the-counter medicines  for pain, discomfort, or fever as directed by your doctor. Do not take aspirin. It can make you bleed. Take medicines (antibiotics) that kill germs if they are prescribed for you.  Call the office or go to the ER if:  You feel sick to your stomach (nauseous) and you start to throw up (vomit).  You have trouble eating or drinking.  You have an oral temperature above 101.  You have constipation that is not helped by adjusting diet or increasing fluid intake. Pain medicines are a common cause of constipation.  You have any other concerns. SEEK IMMEDIATE MEDICAL CARE IF:  You have persistent dizziness.  You have difficulty breathing or a congested sounding (croupy) cough.  You have an oral temperature above 102.5, not controlled by medicine.  There is increasing  pain or tenderness near or in the surgical site.   AMBULATORY SURGERY  DISCHARGE INSTRUCTIONS   1) The drugs that you were given will stay in your system until tomorrow so for the next 24 hours you should not:  A) Drive an automobile B) Make any legal decisions C) Drink any alcoholic beverage   2) You may resume regular meals tomorrow.  Today it is better to start with liquids and gradually work up to solid foods.  You may eat anything you prefer, but it is better to start with liquids, then soup and crackers, and gradually work up to solid foods.   3) Please notify your doctor immediately if you have any unusual bleeding, trouble breathing, redness and pain at the surgery site, drainage, fever, or pain not relieved by medication.    4) Additional Instructions:        Please contact your physician with any problems or Same Day Surgery at [phone], Monday through Friday 6 am to 4 pm, or [place] [facility] number at [phone].

## 2018-03-02 NOTE — Anesthesia Preprocedure Evaluation (Addendum)
Anesthesia Evaluation  Patient identified by MRN, date of birth, ID band Patient awake    Reviewed: Allergy & Precautions, H&P , NPO status , Patient's Chart, lab work & pertinent test results  History of Anesthesia Complications (+) PONV and history of anesthetic complications  Airway Mallampati: II  TM Distance: >3 FB     Dental  (+) Teeth Intact   Pulmonary neg pulmonary ROS, former smoker,           Cardiovascular hypertension, negative cardio ROS       Neuro/Psych  Headaches, Seizures - (febrile seizure as a child),  PSYCHIATRIC DISORDERS Anxiety Depression negative neurological ROS  negative psych ROS   GI/Hepatic negative GI ROS, Neg liver ROS, GERD  ,  Endo/Other  negative endocrine ROS  Renal/GU      Musculoskeletal   Abdominal   Peds  Hematology negative hematology ROS (+)   Anesthesia Other Findings Past Medical History: No date: Anxiety No date: Depression No date: GERD (gastroesophageal reflux disease)     Comment:  OCC No date: Hyperlipidemia No date: Migraine No date: Pre-eclampsia No date: Seizures ([facility])     Comment:  FEBRILE AS A BABY  Past Surgical History: No date: ABDOMINAL HYSTERECTOMY No date: CERVICAL BIOPSY  W/ LOOP ELECTRODE EXCISION No date: ECTOPIC PREGNANCY SURGERY No date: endometirosis No date: HAND SURGERY     Comment:  THUMB SURGERY No date: LEEP No date: LEEP 03/19/2017: VENTRAL HERNIA REPAIR; N/A     Comment:  Procedure: HERNIA REPAIR VENTRAL ADULT;  Surgeon:               Florene Glen, MD;  Location: [facility] ORS;  Service:               General;  Laterality: N/A;  BMI    Body Mass Index:  29.73 kg/m      Reproductive/Obstetrics negative OB ROS                            Anesthesia Physical Anesthesia Plan  ASA: II  Anesthesia Plan: General ETT   Post-op Pain Management:    Induction:   PONV Risk Score and Plan:  Ondansetron, Dexamethasone, Midazolam and Scopolamine patch - Pre-op  Airway Management Planned:   Additional Equipment:   Intra-op Plan:   Post-operative Plan:   Informed Consent: I have reviewed the patients History and Physical, chart, labs and discussed the procedure including the risks, benefits and alternatives for the proposed anesthesia with the patient or authorized representative who has indicated his/her understanding and acceptance.   Dental Advisory Given  Plan Discussed with: Anesthesiologist and CRNA  Anesthesia Plan Comments:        Anesthesia Quick Evaluation

## 2018-03-02 NOTE — Transfer of Care (Signed)
Immediate Anesthesia Transfer of Care Note  Patient: Caitlyn Harris  Procedure(s) Performed: LAPAROSCOPIC RIGHT OVARIAN CYSTECTOMY (Left Abdomen) POSTERIOR REPAIR (RECTOCELE) (N/A Vagina ) PERINEORRHAPHY (N/A )  Patient Location: PACU  Anesthesia Type:General  Level of Consciousness: awake and drowsy  Airway & Oxygen Therapy: Patient Spontanous Breathing and Patient connected to face mask  Post-op Assessment: Report given to RN and Post -op Vital signs reviewed and stable  Post vital signs: stable  Last Vitals:  Vitals Value Taken Time  BP 116/84 03/02/2018  3:36 PM  Temp 36.8 C 03/02/2018  3:35 PM  Pulse 81 03/02/2018  3:44 PM  Resp 16 03/02/2018  3:44 PM  SpO2 100 % 03/02/2018  3:44 PM  Vitals shown include unvalidated device data.  Last Pain:  Vitals:   03/02/18 1535  TempSrc:   PainSc: 0-No pain         Complications: No apparent anesthesia complications

## 2018-03-02 NOTE — OR Nursing (Signed)
Discharge instructions discussed with pt and huisband. Both voice understanding.

## 2018-03-02 NOTE — Anesthesia Post-op Follow-up Note (Signed)
Anesthesia QCDR form completed.        

## 2018-03-02 NOTE — Interval H&P Note (Signed)
History and Physical Interval Note:  03/02/2018 12:31 PM  Caitlyn Harris  has presented today for surgery, with the diagnosis of PELVIC PAIN,ENDOMETRIOSIS,RECTOCELE, OVARIAN CYSTS  The various methods of treatment have been discussed with the patient and family. After consideration of risks, benefits and other options for treatment, the patient has consented to  Procedure(s): LAPAROSCOPIC OVARIAN CYSTECTOMY EXCISION OF ENDOMETRIOSIS (Bilateral) POSTERIOR REPAIR (RECTOCELE) (N/A) as a surgical intervention .  The patient's history has been reviewed, patient examined, no change in status, stable for surgery.  I have reviewed the patient's chart and labs.  Questions were answered to the patient's satisfaction.     Hoyt Koch

## 2018-03-03 ENCOUNTER — Encounter: Payer: Self-pay | Admitting: Obstetrics & Gynecology

## 2018-03-03 NOTE — Anesthesia Postprocedure Evaluation (Signed)
Anesthesia Post Note  Patient: TENIYA FILTER  Procedure(s) Performed: LAPAROSCOPIC RIGHT OVARIAN CYSTECTOMY (Left Abdomen) POSTERIOR REPAIR (RECTOCELE) (N/A Vagina ) PERINEORRHAPHY (N/A )  Patient location during evaluation: PACU Anesthesia Type: General Level of consciousness: awake and alert and oriented Pain management: pain level controlled Vital Signs Assessment: post-procedure vital signs reviewed and stable Respiratory status: spontaneous breathing Cardiovascular status: blood pressure returned to baseline Anesthetic complications: no     Last Vitals:  Vitals:   03/02/18 1633 03/02/18 1744  BP: 120/69 121/68  Pulse: 79 80  Resp: 16 16  Temp: 36.7 C   SpO2: 100% 100%    Last Pain:  Vitals:   03/02/18 1633  TempSrc:   PainSc: 0-No pain                 Derian Pfost

## 2018-03-09 LAB — SURGICAL PATHOLOGY

## 2018-03-12 ENCOUNTER — Encounter: Payer: Self-pay | Admitting: Obstetrics & Gynecology

## 2018-03-12 ENCOUNTER — Ambulatory Visit (INDEPENDENT_AMBULATORY_CARE_PROVIDER_SITE_OTHER): Payer: BLUE CROSS/BLUE SHIELD | Admitting: Obstetrics & Gynecology

## 2018-03-12 VITALS — BP 120/80 | Ht 73.0 in | Wt 220.0 lb

## 2018-03-12 DIAGNOSIS — G8929 Other chronic pain: Secondary | ICD-10-CM

## 2018-03-12 DIAGNOSIS — Z9889 Other specified postprocedural states: Secondary | ICD-10-CM

## 2018-03-12 DIAGNOSIS — Z23 Encounter for immunization: Secondary | ICD-10-CM

## 2018-03-12 DIAGNOSIS — R102 Pelvic and perineal pain: Secondary | ICD-10-CM

## 2018-03-12 NOTE — Progress Notes (Signed)
  Postoperative Follow-up Patient presents post op from lap cystectomy and rectocele repair for chronic pelvic pain and cysts noted on ultrasound, as well as rectocele, 1 week ago. Pathology: DIAGNOSIS:  A. OVARY, CYST, RIGHT; EXCISION:  -HEMORRHAGIC CORPUS LUTEUM.  - BENIGN OVARIAN TISSUE FRAGMENTS WITH BENIGN SIMPLE CYST.  - NEGATIVE FOR MALIGNANCY.  Images:      Subjective: Patient reports marked improvement in her preop symptoms. Eating a regular diet without difficulty. The patient is not having any pain.  Activity: normal activities of daily living. Patient reports additional symptom's since surgery of None.  Objective: BP 120/80   Ht 6\' 1"  (1.854 m)   Wt 220 lb (99.8 kg)   BMI 29.03 kg/m  Physical Exam  Constitutional: She is oriented to person, place, and time. She appears well-developed and well-nourished. No distress.  Cardiovascular: Normal rate.  Pulmonary/Chest: Effort normal.  Abdominal: Soft. She exhibits no distension. There is no tenderness.  Incision Healing Well   Musculoskeletal: Normal range of motion.  Neurological: She is alert and oriented to person, place, and time. No cranial nerve deficit.  Skin: Skin is warm and dry.  Psychiatric: She has a normal mood and affect.    Assessment: s/p :  Lap Cystectomy and Posterior Repair stable  Plan: Patient has done well after surgery with no apparent complications.  I have discussed the post-operative course to date, and the expected progress moving forward.  The patient understands what complications to be concerned about.  I will see the patient in routine follow up, or sooner if needed.    Activity plan: No heavy lifting. Pelvic rest.  Hoyt Koch 03/12/2018, 8:35 AM

## 2018-04-05 ENCOUNTER — Ambulatory Visit: Payer: BLUE CROSS/BLUE SHIELD | Admitting: Obstetrics & Gynecology

## 2018-04-05 NOTE — Progress Notes (Deleted)
  Postoperative Follow-up Patient presents post op from Laparoscopy with ovarian cystectomy for pelvic pain and endometriosis history (has had prior TLH) and posterio colporrhaphy for rectocele repair, 6 weeks ago.  Subjective: Patient reports {Desc; no/some/marked:19219} improvement in her preop symptoms. Eating a regular diet without difficulty. The patient is not having any pain.  Activity: normal activities of daily living. Patient reports additional symptom's since surgery of None.  Objective: There were no vitals taken for this visit. OBGyn Exam  Assessment: s/p :  lap/cystectomy, post repair progressing well  Plan: Patient has done well after surgery with no apparent complications.  I have discussed the post-operative course to date, and the expected progress moving forward.  The patient understands what complications to be concerned about.  I will see the patient in routine follow up, or sooner if needed.    Activity plan: No restriction..  Pelvic rest.  Hoyt Koch 04/05/2018, 8:01 AM

## 2018-04-09 ENCOUNTER — Ambulatory Visit (INDEPENDENT_AMBULATORY_CARE_PROVIDER_SITE_OTHER): Payer: BLUE CROSS/BLUE SHIELD | Admitting: Obstetrics & Gynecology

## 2018-04-09 ENCOUNTER — Encounter: Payer: Self-pay | Admitting: Family Medicine

## 2018-04-09 ENCOUNTER — Encounter: Payer: Self-pay | Admitting: Obstetrics & Gynecology

## 2018-04-09 ENCOUNTER — Telehealth: Payer: Self-pay | Admitting: Family Medicine

## 2018-04-09 VITALS — BP 120/70 | Ht 73.0 in | Wt 223.0 lb

## 2018-04-09 DIAGNOSIS — B3731 Acute candidiasis of vulva and vagina: Secondary | ICD-10-CM

## 2018-04-09 DIAGNOSIS — B373 Candidiasis of vulva and vagina: Secondary | ICD-10-CM

## 2018-04-09 MED ORDER — FLUCONAZOLE 150 MG PO TABS: 150.0000 mg | ORAL_TABLET | Freq: Once | ORAL | 1 refills | Status: AC

## 2018-04-09 NOTE — Telephone Encounter (Signed)
Pt called office requesting for [facility]ner to put in a order to get the [place] lab done. Pt stated she has been experiencing headaches and assuming it may have to do with certain foods.

## 2018-04-09 NOTE — Telephone Encounter (Signed)
Sent patient a mychart message to clarify her request.

## 2018-04-09 NOTE — Patient Instructions (Signed)
Fluconazole tablets What is this medicine? FLUCONAZOLE (floo KON na zole) is an antifungal medicine. It is used to treat certain kinds of fungal or yeast infections. This medicine may be used for other purposes; ask your health care provider or pharmacist if you have questions. COMMON BRAND NAME(S): Diflucan What should I tell my health care provider before I take this medicine? They need to know if you have any of these conditions: -history of irregular heart beat -kidney disease -an unusual or allergic reaction to fluconazole, other azole antifungals, medicines, foods, dyes, or preservatives -pregnant or trying to get pregnant -breast-feeding How should I use this medicine? Take this medicine by mouth. Follow the directions on the prescription label. Do not take your medicine more often than directed. Talk to your pediatrician regarding the use of this medicine in children. Special care may be needed. This medicine has been used in children as young as 15 months of age. Overdosage: If you think you have taken too much of this medicine contact a poison control center or emergency room at once. NOTE: This medicine is only for you. Do not share this medicine with others. What if I miss a dose? If you miss a dose, take it as soon as you can. If it is almost time for your next dose, take only that dose. Do not take double or extra doses. What may interact with this medicine? Do not take this medicine with any of the following medications: -astemizole -certain medicines for irregular heart beat like dofetilide, dronedarone, quinidine -cisapride -erythromycin -lomitapide -other medicines that prolong the QT interval (cause an abnormal heart rhythm) -pimozide -terfenadine -thioridazine -tolvaptan -ziprasidone This medicine may also interact with the following medications: -antiviral medicines for HIV or AIDS -birth control pills -certain antibiotics like rifabutin, rifampin -certain  medicines for blood pressure like amlodipine, isradipine, felodipine, hydrochlorothiazide, losartan, nifedipine -certain medicines for cancer like cyclophosphamide, vinblastine, vincristine -certain medicines for cholesterol like atorvastatin, lovastatin, fluvastatin, simvastatin -certain medicines for depression, anxiety, or psychotic disturbances like amitriptyline, midazolam, nortriptyline, triazolam -certain medicines for diabetes like glipizide, glyburide, tolbutamide -certain medicines for pain like alfentanil, fentanyl, methadone -certain medicines for seizures like carbamazepine, phenytoin -certain medicines that treat or prevent blood clots like warfarin -halofantrine -medicines that lower your chance of fighting infection like cyclosporine, prednisone, tacrolimus -NSAIDS, medicines for pain and inflammation, like celecoxib, diclofenac, flurbiprofen, ibuprofen, meloxicam, naproxen -other medicines for fungal infections -sirolimus -theophylline -tofacitinib This list may not describe all possible interactions. Give your health care provider a list of all the medicines, herbs, non-prescription drugs, or dietary supplements you use. Also tell them if you smoke, drink alcohol, or use illegal drugs. Some items may interact with your medicine. What should I watch for while using this medicine? Visit your doctor or health care professional for regular checkups. If you are taking this medicine for a long time you may need blood work. Tell your doctor if your symptoms do not improve. Some fungal infections need many weeks or months of treatment to cure. Alcohol can increase possible damage to your liver. Avoid alcoholic drinks. If you have a vaginal infection, do not have sex until you have finished your treatment. You can wear a sanitary napkin. Do not use tampons. Wear freshly washed cotton, not synthetic, panties. What side effects may I notice from receiving this medicine? Side effects that  you should report to your doctor or health care professional as soon as possible: -allergic reactions like skin rash or itching, hives, swelling of the  lips, mouth, tongue, or throat -dark urine -feeling dizzy or faint -irregular heartbeat or chest pain -redness, blistering, peeling or loosening of the skin, including inside the mouth -trouble breathing -unusual bruising or bleeding -vomiting -yellowing of the eyes or skin Side effects that usually do not require medical attention (report to your doctor or health care professional if they continue or are bothersome): -changes in how food tastes -diarrhea -headache -stomach upset or nausea This list may not describe all possible side effects. Call your doctor for medical advice about side effects. You may report side effects to FDA at 1-800-FDA-1088. Where should I keep my medicine? Keep out of the reach of children. Store at room temperature below 30 degrees C (86 degrees F). Throw away any medicine after the expiration date. NOTE: This sheet is a summary. It may not cover all possible information. If you have questions about this medicine, talk to your doctor, pharmacist, or health care provider.  2019 Elsevier/Gold Standard (2012-10-30 19:37:38)

## 2018-04-09 NOTE — Progress Notes (Signed)
HPI:      [name] is a 38 y.o. [id number] who LMP was No LMP recorded. Patient has had a hysterectomy., presents today for a problem visit.  She complains of:  Vaginitis: Patient complains of an abnormal vaginal discharge for a few days. Vaginal symptoms include discharge described as white.Vulvar symptoms include vulvar itching.STI Risk: Very low risk of STD exposureDischarge described as: white.Other associated symptoms: none.Menstrual pattern: She had been bleeding none. Contraception: none   Pt feels she has improved since surgery for cystectomy and rectocele repair.  BMs better, still spaced out but no straining.   PMHx: She  has a past medical history of Anxiety, Depression, GERD (gastroesophageal reflux disease), Hyperlipidemia, Migraine, Pre-eclampsia, and Seizures ([place]). Also,  has a past surgical history that includes Abdominal hysterectomy; LEEP; LEEP; Ectopic pregnancy surgery; endometirosis; Hand surgery; Ventral hernia repair (N/A, 03/19/2017); Cervical biopsy w/ loop electrode excision; Laparoscopic ovarian cystectomy (Left, 03/02/2018); Rectocele repair (N/A, 03/02/2018); and Perineoplasty (N/A, 03/02/2018)., family history includes Hypertension in her father and mother; Renal cancer in her father.,  reports that she quit smoking about 9 years ago. Her smoking use included cigarettes. She quit after 7.00 years of use. She has never used smokeless tobacco. She reports current alcohol use. She reports that she does not use drugs.  She has a current medication list which includes the following prescription(s): acetaminophen, citalopram, ibuprofen, omeprazole, oxycodone-acetaminophen, pseudoephedrine-naproxen na, sumatriptan, topiramate, and fluconazole. Also, has No Known Allergies.  Review of Systems  Constitutional: Negative for chills, fever and malaise/fatigue.  HENT: Negative for congestion, sinus pain and sore throat.   Eyes: Negative for blurred vision and pain.    Respiratory: Negative for cough and wheezing.   Cardiovascular: Negative for chest pain and leg swelling.  Gastrointestinal: Negative for abdominal pain, constipation, diarrhea, heartburn, nausea and vomiting.  Genitourinary: Negative for dysuria, frequency, hematuria and urgency.  Musculoskeletal: Negative for back pain, joint pain, myalgias and neck pain.  Skin: Negative for itching and rash.  Neurological: Negative for dizziness, tremors and weakness.  Endo/Heme/Allergies: Does not bruise/bleed easily.  Psychiatric/Behavioral: Negative for depression. The patient is not nervous/anxious and does not have insomnia.     Objective: BP 120/70   Ht 6\' 1"  (1.854 m)   Wt 223 lb (101.2 kg)   BMI 29.42 kg/m  Physical Exam Constitutional:      General: She is not in acute distress.    Appearance: She is well-developed.  Genitourinary:     Pelvic exam was performed with patient supine.     Vagina normal.     No vaginal erythema or bleeding.     Genitourinary Comments: Cuff intact/ no lesions  Absent uterus and cervix  HENT:     Head: Normocephalic and atraumatic.     Nose: Nose normal.  Abdominal:     General: There is no distension.     Palpations: Abdomen is soft.     Tenderness: There is no abdominal tenderness.  Musculoskeletal: Normal range of motion.  Neurological:     Mental Status: She is alert and oriented to person, place, and time.     Cranial Nerves: No cranial nerve deficit.  Skin:    General: Skin is warm and dry.   Microscopic wet-mount exam shows hyphae.  ASSESSMENT/PLAN:    Problem List Items Addressed This Visit      Genitourinary   Candida vaginitis - Primary   Relevant Medications   fluconazole (DIFLUCAN) 150 MG tablet    Also  cont care for rectocele prevention moving forward  Barnett Applebaum, MD, [facility], [facility] 04/09/2018  11:29 AM

## 2018-06-06 ENCOUNTER — Other Ambulatory Visit: Payer: Self-pay | Admitting: Family Medicine

## 2018-06-06 DIAGNOSIS — E559 Vitamin D deficiency, unspecified: Secondary | ICD-10-CM

## 2018-06-06 DIAGNOSIS — E781 Pure hyperglyceridemia: Secondary | ICD-10-CM

## 2018-06-07 ENCOUNTER — Other Ambulatory Visit (INDEPENDENT_AMBULATORY_CARE_PROVIDER_SITE_OTHER): Payer: BLUE CROSS/BLUE SHIELD

## 2018-06-07 DIAGNOSIS — E559 Vitamin D deficiency, unspecified: Secondary | ICD-10-CM

## 2018-06-07 DIAGNOSIS — E781 Pure hyperglyceridemia: Secondary | ICD-10-CM

## 2018-06-07 LAB — LIPID PANEL
CHOL/HDL RATIO: 5
Cholesterol: 142 mg/dL (ref 0–200)
HDL: 28 mg/dL — ABNORMAL LOW (ref >39.00)
LDL Cholesterol: 75 mg/dL (ref 0–99)
NONHDL: 113.58
Triglycerides: 193 mg/dL — ABNORMAL HIGH (ref 0.0–149.0)
VLDL: 38.6 mg/dL (ref 0.0–40.0)

## 2018-06-07 LAB — VITAMIN D 25 HYDROXY (VIT D DEFICIENCY, FRACTURES): VITD: 26.24 ng/mL — ABNORMAL LOW (ref 30.00–100.00)

## 2018-06-11 ENCOUNTER — Encounter: Payer: Self-pay | Admitting: Family Medicine

## 2018-06-11 ENCOUNTER — Ambulatory Visit (INDEPENDENT_AMBULATORY_CARE_PROVIDER_SITE_OTHER): Payer: BLUE CROSS/BLUE SHIELD | Admitting: Family Medicine

## 2018-06-11 VITALS — BP 98/60 | HR 78 | Temp 98.2°F | Ht 73.0 in | Wt 227.4 lb

## 2018-06-11 DIAGNOSIS — E559 Vitamin D deficiency, unspecified: Secondary | ICD-10-CM | POA: Diagnosis not present

## 2018-06-11 DIAGNOSIS — E781 Pure hyperglyceridemia: Secondary | ICD-10-CM

## 2018-06-11 DIAGNOSIS — F329 Major depressive disorder, single episode, unspecified: Secondary | ICD-10-CM

## 2018-06-11 MED ORDER — BUPROPION HCL ER (XL) 150 MG PO TB24: 150.0000 mg | ORAL_TABLET | Freq: Every day | ORAL | 5 refills | Status: DC

## 2018-06-11 NOTE — Patient Instructions (Signed)
Good to see you today  Good work lowering your triglycerides  I have sent in a morning medication (bupropion) to help with your mood  Follow up in 8 weeks- be in touch if any problems with medication or if your symptoms get worse

## 2018-06-11 NOTE — Progress Notes (Signed)
Subjective:    Patient ID: Caitlyn Harris, female    DOB: 1980/06/05, 38 y.o.   MRN: [id number]  HPI This is a 38 yo female who presents today for follow up of chronic medical conditions.   Hypertriglyceridemia- recent labs significantly improved. Has been watching portions.   Depression- continues to feel increased depression since her father's death,  feels like she is not motivated to do anything, went to counseling once, has had a hard time with her schedule. Doesn't want to get up in the mornings, doesn't feel that she is present for her children.   Past Medical History:  Diagnosis Date  . Anxiety   . Depression   . GERD (gastroesophageal reflux disease)    OCC  . Hyperlipidemia   . Migraine   . Pre-eclampsia   . Seizures ([place])    FEBRILE AS A BABY   Past Surgical History:  Procedure Laterality Date  . ABDOMINAL HYSTERECTOMY    . CERVICAL BIOPSY  W/ LOOP ELECTRODE EXCISION    . ECTOPIC PREGNANCY SURGERY    . endometirosis    . HAND SURGERY     THUMB SURGERY  . LAPAROSCOPIC OVARIAN CYSTECTOMY Left 03/02/2018   Procedure: LAPAROSCOPIC RIGHT OVARIAN CYSTECTOMY;  Surgeon: Gae Dry, MD;  Location: [facility];  Service: Gynecology;  Laterality: Left;  . LEEP    . LEEP    . PERINEOPLASTY N/A 03/02/2018   Procedure: PERINEORRHAPHY;  Surgeon: Gae Dry, MD;  Location: [facility] ORS;  Service: Gynecology;  Laterality: N/A;  . RECTOCELE REPAIR N/A 03/02/2018   Procedure: POSTERIOR REPAIR (RECTOCELE);  Surgeon: Gae Dry, MD;  Location: [facility] OR[facility];  Service: Gynecology;  Laterality: N/A;  . VENTRAL HERNIA REPAIR N/A 03/19/2017   Procedure: HERNIA REPAIR VENTRAL ADULT;  Surgeon: Florene Glen, MD;  Location: [facility] ORS;  Service: General;  Laterality: N/A;   Family History  Problem Relation Age of Onset  . Hypertension Mother   . Renal cancer Father   . Hypertension Father    Social History   Tobacco Use  . Smoking status: Former Smoker    Years: 7.00      Types: Cigarettes    Last attempt to quit: 03/12/2009    Years since quitting: 9.2  . Smokeless tobacco: Never Used  . Tobacco comment: 1 PACK EVERY 2 WEEKS  Substance Use Topics  . Alcohol use: Yes    Comment: RARE  . Drug use: No      Review of Systems Per HPI    Objective:   Physical Exam Vitals signs reviewed.  Constitutional:      General: She is not in acute distress.    Appearance: Normal appearance. She is obese. She is not ill-appearing, toxic-appearing or diaphoretic.  HENT:     Head: Normocephalic and atraumatic.  Eyes:     Conjunctiva/sclera: Conjunctivae normal.  Cardiovascular:     Rate and Rhythm: Normal rate.  Pulmonary:     Effort: Pulmonary effort is normal.  Neurological:     Mental Status: She is alert and oriented to person, place, and time.  Psychiatric:        Mood and Affect: Mood normal.        Behavior: Behavior normal.        Thought Content: Thought content normal.        Judgment: Judgment normal.       BP 98/60 (BP Location: Right Arm, Patient Position: Sitting, Cuff Size: Normal)   Pulse  78   Temp 98.2 F (36.8 C) (Oral)   Ht 6\' 1"  (1.854 m)   Wt 227 lb 6.4 oz (103.1 kg)   SpO2 99%   BMI 30.00 kg/m  Wt Readings from Last 3 Encounters:  06/11/18 227 lb 6.4 oz (103.1 kg)  04/09/18 223 lb (101.2 kg)  03/12/18 220 lb (99.8 kg)       Assessment & Plan:  1. Reactive depression - will add bupropion, encouraged her to increase exercise, make healthy food choices - follow up in 8 weeks, sooner if no improvement or problems with medicaiton - buPROPion (WELLBUTRIN XL) 150 MG 24 hr tablet; Take 1 tablet (150 mg total) by mouth daily.  Dispense: 30 tablet; Refill: 5  2. Vitamin D deficiency, unspecified - improved, continue supplementation  3. Hypertriglyceridemia - improved, continue improved food choices   Clarene Reamer, FNP-BC  [facility] at [facility], [place]  06/11/2018 10:07 PM

## 2018-07-01 ENCOUNTER — Encounter: Payer: Self-pay | Admitting: Family Medicine

## 2018-07-28 ENCOUNTER — Other Ambulatory Visit: Payer: Self-pay | Admitting: *Deleted

## 2018-07-28 DIAGNOSIS — F329 Major depressive disorder, single episode, unspecified: Secondary | ICD-10-CM

## 2018-07-28 MED ORDER — BUPROPION HCL ER (XL) 150 MG PO TB24: 150.0000 mg | ORAL_TABLET | Freq: Every day | ORAL | 0 refills | Status: DC

## 2018-07-28 NOTE — Telephone Encounter (Signed)
Pharmacy requesting 90 day supply

## 2018-08-07 ENCOUNTER — Other Ambulatory Visit: Payer: Self-pay | Admitting: Family Medicine

## 2018-08-07 DIAGNOSIS — G43709 Chronic migraine without aura, not intractable, without status migrainosus: Secondary | ICD-10-CM

## 2018-08-09 NOTE — Telephone Encounter (Signed)
Med request for Imitrex.  Last filled 12/11/2017 for 10 tablets with 1 refill. LOV 06/11/2018 follow up  Future appt 08/11/2018 for follow up  Please review

## 2018-08-11 ENCOUNTER — Encounter: Payer: Self-pay | Admitting: Family Medicine

## 2018-08-11 ENCOUNTER — Ambulatory Visit (INDEPENDENT_AMBULATORY_CARE_PROVIDER_SITE_OTHER): Payer: BLUE CROSS/BLUE SHIELD | Admitting: Family Medicine

## 2018-08-11 VITALS — Temp 98.4°F | Ht 73.0 in | Wt 221.4 lb

## 2018-08-11 DIAGNOSIS — F411 Generalized anxiety disorder: Secondary | ICD-10-CM | POA: Diagnosis not present

## 2018-08-11 DIAGNOSIS — F329 Major depressive disorder, single episode, unspecified: Secondary | ICD-10-CM | POA: Diagnosis not present

## 2018-08-11 NOTE — Progress Notes (Signed)
Virtual Visit via Video Note  I connected with Caitlyn Harris on 08/11/18 at  9:25 AM EDT by a video enabled telemedicine application and verified that I am speaking with the correct person using two identifiers.  Location: Patient: In her vehicle Provider: [place]   I discussed the limitations of evaluation and management by telemedicine and the availability of in person appointments. The patient expressed understanding and agreed to proceed.  History of Present Illness: This is a 38 year old female who agrees to virtual visit today for 22-month follow-up of anxiety and depression.  She reports that this is been a difficult time, as she continues to remain fatigued.  Her husband is working at home while overseeing distance education of their first grader.  Her 96 year old stepson is contemplating moving in with them.  She has had some decreased appetite.  She always has difficulty staying asleep and has a previous sleep study which showed no apnea but frequent awakening.  Many years ago she was on Ambien but stopped when she became pregnant and did not resume due to not liking the way it made her feel.  She has taken an over-the-counter supplement formula 303 with good results.  She has not recently been taking it because she has been out but is considering restarting this.  She is currently taking bupropion XL 150 mg daily and citalopram 40 mg daily. PHQ 9 and GAD 7 scores below.   Past Medical History:  Diagnosis Date  . Anxiety   . Depression   . GERD (gastroesophageal reflux disease)    OCC  . Hyperlipidemia   . Migraine   . Pre-eclampsia   . Seizures ([place])    FEBRILE AS A BABY   Past Surgical History:  Procedure Laterality Date  . ABDOMINAL HYSTERECTOMY    . CERVICAL BIOPSY  W/ LOOP ELECTRODE EXCISION    . ECTOPIC PREGNANCY SURGERY    . endometirosis    . HAND SURGERY     THUMB SURGERY  . LAPAROSCOPIC OVARIAN CYSTECTOMY Left 03/02/2018   Procedure: LAPAROSCOPIC  RIGHT OVARIAN CYSTECTOMY;  Surgeon: Gae Dry, MD;  Location: [facility];  Service: Gynecology;  Laterality: Left;  . LEEP    . LEEP    . PERINEOPLASTY N/A 03/02/2018   Procedure: PERINEORRHAPHY;  Surgeon: Gae Dry, MD;  Location: [facility];  Service: Gynecology;  Laterality: N/A;  . RECTOCELE REPAIR N/A 03/02/2018   Procedure: POSTERIOR REPAIR (RECTOCELE);  Surgeon: Gae Dry, MD;  Location: [facility];  Service: Gynecology;  Laterality: N/A;  . VENTRAL HERNIA REPAIR N/A 03/19/2017   Procedure: HERNIA REPAIR VENTRAL ADULT;  Surgeon: Florene Glen, MD;  Location: [facility];  Service: General;  Laterality: N/A;   Family History  Problem Relation Age of Onset  . Hypertension Mother   . Renal cancer Father   . Hypertension Father    Social History   Tobacco Use  . Smoking status: Former Smoker    Years: 7.00    Types: Cigarettes    Last attempt to quit: 03/12/2009    Years since quitting: 9.4  . Smokeless tobacco: Never Used  . Tobacco comment: 1 PACK EVERY 2 WEEKS  Substance Use Topics  . Alcohol use: Yes    Comment: RARE  . Drug use: No    Observations/Objective: The patient is alert and answers questions appropriately.  Visible skin is unremarkable.  She is normally conversive without obvious shortness of breath.  Her mood and affect are appropriate.  Temp 98.4 F (36.9 C) Comment: per patient  Ht 6\' 1"  (1.854 m)   Wt 221 lb 6 oz (100.4 kg) Comment: per patient  BMI 29.21 kg/m  Wt Readings from Last 3 Encounters:  08/11/18 221 lb 6 oz (100.4 kg)  06/11/18 227 lb 6.4 oz (103.1 kg)  04/09/18 223 lb (101.2 kg)   Depression screen [facility] 2/9 08/11/2018 06/11/2018 02/23/2017  Decreased Interest 2 3 1   Down, Depressed, Hopeless 1 - 1  PHQ - 2 Score 3 3 2   Altered sleeping 3 3 3   Tired, decreased energy 3 3 3   Change in appetite 2 3 3   Feeling bad or failure about yourself  0 3 3  Trouble concentrating 2 3 3   Moving slowly or fidgety/restless 0 3 1  Suicidal  thoughts 0 0 0  PHQ-9 Score 13 21 18   Difficult doing work/chores Somewhat difficult Very difficult Very difficult   GAD 7 : Generalized Anxiety Score 08/11/2018 06/11/2018  Nervous, Anxious, on Edge 1 1  Control/stop worrying 0 1  Worry too much - different things 0 1  Trouble relaxing 1 2  Restless 0 1  Easily annoyed or irritable 1 2  Afraid - awful might happen 0 0  Total GAD 7 Score 3 8  Anxiety Difficulty Somewhat difficult -     Assessment and Plan: 1. Generalized anxiety disorder -Continue current medications -Discussed role of sleep and anxiety and depression and encouraged good sleep hygiene, regular exercise -GAD 7 score improved from 8-3 -Follow-up in 3 to 4 months, sooner if worsening symptoms  2. Reactive depression -Continue current medications -PHQ 9 score has improved from 21-13 -Follow-up in 3 to 4 months   Clarene Reamer, FNP-BC  [facility] at [facility], [facility]  08/11/2018 9:59 AM   Follow Up Instructions:    I discussed the assessment and treatment plan with the patient. The patient was provided an opportunity to ask questions and all were answered. The patient agreed with the plan and demonstrated an understanding of the instructions.   The patient was advised to call back or seek an in-person evaluation if the symptoms worsen or if the condition fails to improve as anticipated.   Elby Beck, FNP

## 2018-09-15 ENCOUNTER — Encounter: Payer: Self-pay | Admitting: Family Medicine

## 2018-09-16 ENCOUNTER — Telehealth: Payer: Self-pay

## 2018-09-16 NOTE — Telephone Encounter (Signed)
Unable to reach pt by phone; left v/m requesting pt to call [phone].

## 2018-09-16 NOTE — Telephone Encounter (Signed)
I also sent mychart message to the patient asking to cal [place] back

## 2018-09-16 NOTE — Telephone Encounter (Signed)
Caitlyn Harris,     I'm having severe pain on my left side of my abdomen. I believe it's my intestines. I'm wondering if I have diverticulitis. I've had episodes like this before and it lasts for awhile. It's unbearable. Should I come see you or go to the ER?   Per my chart from patient. Please call to triage. Thank you

## 2018-09-22 NOTE — Telephone Encounter (Signed)
Pt called back; pt is not having any symptoms now; pt has been busy at work and that is why has not called our office back. On 09/16/18 pt had lower lt side pain & nausea& chills. Pt has on and off and usually each episode last 2 days. Pt has IBS(usually goes between diarrhea & constipation) and thinks pain related to intestines;Pt wonders if could be diverticulitis. No fever, cough,S/T,SOB,muscle pain,no loss of taste or smell. Pt has migraines periodically. Pt could not schedule in office appt until 09/24/18 at 11 AM. FYI to Glenda Chroman FNP.

## 2018-09-22 NOTE — Telephone Encounter (Signed)
Noted  

## 2018-09-22 NOTE — Telephone Encounter (Signed)
Called and left message for patient again. Also called husband (ok per DPR) and he will ask patient to give [place] a call back to discuss her symptoms and how to proceed. Advised to ask for Rena to be triaged. Thank you

## 2018-09-24 ENCOUNTER — Telehealth: Payer: Self-pay | Admitting: Family Medicine

## 2018-09-24 ENCOUNTER — Ambulatory Visit (INDEPENDENT_AMBULATORY_CARE_PROVIDER_SITE_OTHER): Payer: BC Managed Care – PPO | Admitting: Family Medicine

## 2018-09-24 ENCOUNTER — Encounter: Payer: Self-pay | Admitting: Family Medicine

## 2018-09-24 ENCOUNTER — Other Ambulatory Visit: Payer: Self-pay

## 2018-09-24 VITALS — BP 110/76 | HR 80 | Temp 97.7°F | Ht 73.0 in | Wt 225.5 lb

## 2018-09-24 DIAGNOSIS — N83202 Unspecified ovarian cyst, left side: Secondary | ICD-10-CM

## 2018-09-24 DIAGNOSIS — R1032 Left lower quadrant pain: Secondary | ICD-10-CM | POA: Diagnosis not present

## 2018-09-24 DIAGNOSIS — K5902 Outlet dysfunction constipation: Secondary | ICD-10-CM | POA: Diagnosis not present

## 2018-09-24 NOTE — Patient Instructions (Signed)
Good to see you today  You will get a call about your ultrasound  Take a daily fiber supplement, drink plenty of water, try for 2 at least two weeks.   Follow up with Dr. Kenton Kingfisher, ? Pelvic floor rehab? Problem following surgery

## 2018-09-24 NOTE — Telephone Encounter (Signed)
LMOM and sent MyChart w/appt and instructions for pelvic U/S 09-28-2018.

## 2018-09-24 NOTE — Telephone Encounter (Signed)
Patient called and stated she received a call about her appointment and was asked to call office back/ Patient had appt the a/m @ 11:00 And I did not see anything documented at the time of call   Patient C/B # [phone]

## 2018-09-24 NOTE — Progress Notes (Signed)
Subjective:    Patient ID: Caitlyn Harris, female    DOB: Jun 22, 1980, 38 y.o.   MRN: [id number]  HPI This is a 38 yo female who presents today with LLQ abdominal pain x several months. Off and on, sharp pain. Increased constipation since rectocele surgery 11/19. Felt like she had an episode of her bowel bulging from her rectum a couple of weeks ago.   Both ears have been itchy. No pain or drainage. Uses cotton swabs daily.   Past Medical History:  Diagnosis Date  . Anxiety   . Depression   . GERD (gastroesophageal reflux disease)    OCC  . Hyperlipidemia   . Migraine   . Pre-eclampsia   . Seizures ([place])    FEBRILE AS A BABY   Past Surgical History:  Procedure Laterality Date  . ABDOMINAL HYSTERECTOMY    . CERVICAL BIOPSY  W/ LOOP ELECTRODE EXCISION    . ECTOPIC PREGNANCY SURGERY    . endometirosis    . HAND SURGERY     THUMB SURGERY  . LAPAROSCOPIC OVARIAN CYSTECTOMY Left 03/02/2018   Procedure: LAPAROSCOPIC RIGHT OVARIAN CYSTECTOMY;  Surgeon: Gae Dry, MD;  Location: [facility]S;  Service: Gynecology;  Laterality: Left;  . LEEP    . LEEP    . PERINEOPLASTY N/A 03/02/2018   Procedure: PERINEORRHAPHY;  Surgeon: Gae Dry, MD;  Location: [facility];  Service: Gynecology;  Laterality: N/A;  . RECTOCELE REPAIR N/A 03/02/2018   Procedure: POSTERIOR REPAIR (RECTOCELE);  Surgeon: Gae Dry, MD;  Location: [facility];  Service: Gynecology;  Laterality: N/A;  . VENTRAL HERNIA REPAIR N/A 03/19/2017   Procedure: HERNIA REPAIR VENTRAL ADULT;  Surgeon: Florene Glen, MD;  Location: [facility];  Service: General;  Laterality: N/A;   Family History  Problem Relation Age of Onset  . Hypertension Mother   . Renal cancer Father   . Hypertension Father    Social History   Tobacco Use  . Smoking status: Former Smoker    Years: 7.00    Types: Cigarettes    Quit date: 03/12/2009    Years since quitting: 9.5  . Smokeless tobacco: Never Used  . Tobacco comment: 1  PACK EVERY 2 WEEKS  Substance Use Topics  . Alcohol use: Yes    Comment: RARE  . Drug use: No      Review of Systems Per HPI    Objective:   Physical Exam Vitals signs reviewed.  Constitutional:      General: She is not in acute distress.    Appearance: Normal appearance. She is obese. She is not ill-appearing, toxic-appearing or diaphoretic.  HENT:     Head: Normocephalic and atraumatic.     Right Ear: Tympanic membrane, ear canal and external ear normal.     Left Ear: Tympanic membrane, ear canal and external ear normal.  Eyes:     Conjunctiva/sclera: Conjunctivae normal.  Cardiovascular:     Rate and Rhythm: Normal rate.  Pulmonary:     Effort: Pulmonary effort is normal.  Skin:    General: Skin is warm and dry.  Neurological:     Mental Status: She is alert and oriented to person, place, and time.  Psychiatric:        Mood and Affect: Mood normal.        Behavior: Behavior normal.        Thought Content: Thought content normal.        Judgment: Judgment normal.  BP 110/76 (BP Location: Right Arm, Patient Position: Sitting, Cuff Size: Normal)   Pulse 80   Temp 97.7 F (36.5 C) (Temporal)   Ht 6\' 1"  (1.854 m)   Wt 225 lb 8 oz (102.3 kg)   SpO2 98%   BMI 29.75 kg/m  Wt Readings from Last 3 Encounters:  09/24/18 225 lb 8 oz (102.3 kg)  08/11/18 221 lb 6 oz (100.4 kg)  06/11/18 227 lb 6.4 oz (103.1 kg)       Assessment & Plan:  1. LLQ pain - has history of multiple cysts on left ovary, could be source of pain, will get ultrasound and have her see gyn - US PELVIC COMPLETE WITH TRANSVAGINAL; Future  2. Cyst of left ovary - US PELVIC COMPLETE WITH TRANSVAGINAL; Future  3. Constipation due to outlet dysfunction - discussed fiber supplementation and importance of adequate water - she will follow up with gyn who performed her rectocele surgery, she may need some pelvic floor physiotherapy   Clarene Reamer, FNP-BC  [facility]  09/25/2018 6:53 AM

## 2018-09-24 NOTE — Telephone Encounter (Signed)
I didn't call pt but ? If [facility] called regarding imaging, will route to them

## 2018-09-25 ENCOUNTER — Encounter: Payer: Self-pay | Admitting: Family Medicine

## 2018-09-28 ENCOUNTER — Ambulatory Visit
Admission: RE | Admit: 2018-09-28 | Discharge: 2018-09-28 | Disposition: A | Discharge status: Home / Self Care | Payer: BC Managed Care – PPO | Source: Ambulatory Visit | Attending: Family Medicine | Admitting: Family Medicine

## 2018-09-28 DIAGNOSIS — R1032 Left lower quadrant pain: Secondary | ICD-10-CM

## 2018-09-28 DIAGNOSIS — N83202 Unspecified ovarian cyst, left side: Secondary | ICD-10-CM

## 2018-09-28 DIAGNOSIS — N8312 Corpus luteum cyst of left ovary: Secondary | ICD-10-CM | POA: Diagnosis not present

## 2018-10-13 ENCOUNTER — Other Ambulatory Visit: Payer: Self-pay | Admitting: Family Medicine

## 2018-10-13 DIAGNOSIS — G43709 Chronic migraine without aura, not intractable, without status migrainosus: Secondary | ICD-10-CM

## 2018-10-14 NOTE — Telephone Encounter (Signed)
Last filled on 08/09/2018 for #10 with 1 refill. LOV 09/24/2018 for acute visit. No future appointments

## 2018-10-26 ENCOUNTER — Other Ambulatory Visit: Payer: Self-pay | Admitting: Family Medicine

## 2018-10-26 DIAGNOSIS — F329 Major depressive disorder, single episode, unspecified: Secondary | ICD-10-CM

## 2018-11-20 ENCOUNTER — Other Ambulatory Visit: Payer: Self-pay | Admitting: Family Medicine

## 2018-11-20 DIAGNOSIS — G43709 Chronic migraine without aura, not intractable, without status migrainosus: Secondary | ICD-10-CM

## 2018-11-22 NOTE — Telephone Encounter (Signed)
Electronic refill request. Sumatriptan Last office visit:   09/24/2018 Last Filled:    10 tablet 0 10/15/2018  Please advise.

## 2018-12-08 ENCOUNTER — Other Ambulatory Visit: Payer: Self-pay

## 2018-12-08 ENCOUNTER — Ambulatory Visit: Payer: Self-pay | Admitting: *Deleted

## 2018-12-08 ENCOUNTER — Emergency Department: Payer: BC Managed Care – PPO

## 2018-12-08 ENCOUNTER — Emergency Department
Admission: EM | Admit: 2018-12-08 | Discharge: 2018-12-08 | Disposition: A | Discharge status: Home / Self Care | Payer: BC Managed Care – PPO | Attending: Emergency Medicine | Admitting: Emergency Medicine

## 2018-12-08 DIAGNOSIS — Z79899 Other long term (current) drug therapy: Secondary | ICD-10-CM | POA: Diagnosis not present

## 2018-12-08 DIAGNOSIS — R102 Pelvic and perineal pain: Secondary | ICD-10-CM

## 2018-12-08 DIAGNOSIS — Z87891 Personal history of nicotine dependence: Secondary | ICD-10-CM | POA: Insufficient documentation

## 2018-12-08 DIAGNOSIS — N83202 Unspecified ovarian cyst, left side: Secondary | ICD-10-CM

## 2018-12-08 DIAGNOSIS — R1032 Left lower quadrant pain: Secondary | ICD-10-CM

## 2018-12-08 DIAGNOSIS — R1030 Lower abdominal pain, unspecified: Secondary | ICD-10-CM | POA: Diagnosis not present

## 2018-12-08 DIAGNOSIS — E785 Hyperlipidemia, unspecified: Secondary | ICD-10-CM | POA: Diagnosis not present

## 2018-12-08 DIAGNOSIS — Z8742 Personal history of other diseases of the female genital tract: Secondary | ICD-10-CM

## 2018-12-08 LAB — URINALYSIS, ROUTINE W REFLEX MICROSCOPIC
Bilirubin Urine: NEGATIVE
Glucose, UA: NEGATIVE mg/dL
Hgb urine dipstick: NEGATIVE
Ketones, ur: NEGATIVE mg/dL
Leukocytes,Ua: NEGATIVE
Nitrite: NEGATIVE
Protein, ur: NEGATIVE mg/dL
Specific Gravity, Urine: 1.014 (ref 1.005–1.030)
pH: 5 (ref 5.0–8.0)

## 2018-12-08 LAB — COMPREHENSIVE METABOLIC PANEL
ALT: 11 U/L (ref 0–44)
AST: 13 U/L — ABNORMAL LOW (ref 15–41)
Albumin: 4.8 g/dL (ref 3.5–5.0)
Alkaline Phosphatase: 46 U/L (ref 38–126)
Anion gap: 8 (ref 5–15)
BUN: 10 mg/dL (ref 6–20)
CO2: 22 mmol/L (ref 22–32)
Calcium: 9 mg/dL (ref 8.9–10.3)
Chloride: 107 mmol/L (ref 98–111)
Creatinine, Ser: 0.89 mg/dL (ref 0.44–1.00)
GFR calc Af Amer: >60 mL/min (ref >60)
GFR calc non Af Amer: >60 mL/min (ref >60)
Glucose, Bld: 85 mg/dL (ref 70–99)
Potassium: 3.5 mmol/L (ref 3.5–5.1)
Sodium: 137 mmol/L (ref 135–145)
Total Bilirubin: 0.6 mg/dL (ref 0.3–1.2)
Total Protein: 7.3 g/dL (ref 6.5–8.1)

## 2018-12-08 LAB — CBC WITH DIFFERENTIAL/PLATELET
Abs Immature Granulocytes: 0.04 10*3/uL (ref 0.00–0.07)
Basophils Absolute: 0.1 10*3/uL (ref 0.0–0.1)
Basophils Relative: 1 %
Eosinophils Absolute: 0.2 10*3/uL (ref 0.0–0.5)
Eosinophils Relative: 3 %
HCT: 38.6 % (ref 36.0–46.0)
Hemoglobin: 13.6 g/dL (ref 12.0–15.0)
Immature Granulocytes: 1 %
Lymphocytes Relative: 24 %
Lymphs Abs: 1.8 10*3/uL (ref 0.7–4.0)
MCH: 30.9 pg (ref 26.0–34.0)
MCHC: 35.2 g/dL (ref 30.0–36.0)
MCV: 87.7 fL (ref 80.0–100.0)
Monocytes Absolute: 0.5 10*3/uL (ref 0.1–1.0)
Monocytes Relative: 7 %
Neutro Abs: 4.6 10*3/uL (ref 1.7–7.7)
Neutrophils Relative %: 64 %
Platelets: 211 10*3/uL (ref 150–400)
RBC: 4.4 MIL/uL (ref 3.87–5.11)
RDW: 12 % (ref 11.5–15.5)
WBC: 7.2 10*3/uL (ref 4.0–10.5)
nRBC: 0 % (ref 0.0–0.2)

## 2018-12-08 LAB — POCT PREGNANCY, URINE: Preg Test, Ur: NEGATIVE

## 2018-12-08 LAB — LIPASE, BLOOD: Lipase: 37 U/L (ref 11–51)

## 2018-12-08 NOTE — ED Triage Notes (Signed)
Pt to the er for LLQ pain that comes once a month. Pt has a hx of ovarian cysts. Pt has had a hysterectomy, but they left her ovaries. Pt says when she applies pressure to the LLQ it helps. Pt says her normal pain only lasts one or 2 days but this is day 3. Pt is taking IBU and last night took one percocet. No acute distress.

## 2018-12-08 NOTE — Telephone Encounter (Signed)
Noted  

## 2018-12-08 NOTE — ED Provider Notes (Signed)
[facility] Emergency Department Provider Note  Time seen: 5:41 PM  I have reviewed the triage vital signs and the nursing notes.   HISTORY  Chief Complaint Abdominal Pain   HPI Caitlyn Harris is a 38 y.o. female with a past medical history of anxiety, depression, hyperlipidemia, migraines, presents to the emergency department for lower abdominal pain.  According to the patient over the past 3 days she is developed left lower quadrant abdominal pain, denies any nausea vomiting or diarrhea.  Denies any dysuria or hematuria, vaginal bleeding or discharge.  Patient status post hysterectomy.  Patient states moderate sharp type pain very low in the left lower quadrant.  Denies any fever cough congestion or shortness of breath.   Past Medical History:  Diagnosis Date  . Anxiety   . Depression   . GERD (gastroesophageal reflux disease)    OCC  . Hyperlipidemia   . Migraine   . Pre-eclampsia   . Seizures ([place])    FEBRILE AS A BABY    Patient Active Problem List   Diagnosis Date Noted  . Hypertriglyceridemia 06/11/2018  . Candida vaginitis 04/09/2018  . Endometriosis 02/01/2018  . Bilateral ovarian cysts 02/01/2018  . Chronic female pelvic pain 02/01/2018  . Pelvic pain 01/18/2018  . History of endometriosis 01/18/2018  . Rectocele 01/18/2018  . Insomnia 08/25/2017  . Cystocele, midline 08/25/2017  . Chronic constipation 08/25/2017  . Umbilical hernia without obstruction and without gangrene   . Chronic migraine without aura without status migrainosus, not intractable 02/04/2016  . Depression 02/04/2016  . Generalized anxiety disorder 02/04/2016  . Vitamin D deficiency, unspecified 02/04/2016    Past Surgical History:  Procedure Laterality Date  . ABDOMINAL HYSTERECTOMY    . CERVICAL BIOPSY  W/ LOOP ELECTRODE EXCISION    . ECTOPIC PREGNANCY SURGERY    . endometirosis    . HAND SURGERY     THUMB SURGERY  . LAPAROSCOPIC OVARIAN CYSTECTOMY Left  03/02/2018   Procedure: LAPAROSCOPIC RIGHT OVARIAN CYSTECTOMY;  Surgeon: Gae Dry, MD;  Location: [facility];  Service: Gynecology;  Laterality: Left;  . LEEP    . LEEP    . PERINEOPLASTY N/A 03/02/2018   Procedure: PERINEORRHAPHY;  Surgeon: Gae Dry, MD;  Location: [facility];  Service: Gynecology;  Laterality: N/A;  . RECTOCELE REPAIR N/A 03/02/2018   Procedure: POSTERIOR REPAIR (RECTOCELE);  Surgeon: Gae Dry, MD;  Location: [facility];  Service: Gynecology;  Laterality: N/A;  . VENTRAL HERNIA REPAIR N/A 03/19/2017   Procedure: HERNIA REPAIR VENTRAL ADULT;  Surgeon: Florene Glen, MD;  Location: [facility];  Service: General;  Laterality: N/A;    Prior to Admission medications   Medication Sig Start Date End Date Taking? Authorizing Provider  acetaminophen (TYLENOL) 500 MG tablet Take 1,000 mg by mouth every 6 (six) hours as needed for moderate pain or headache.    [provider]  buPROPion (WELLBUTRIN XL) 150 MG 24 hr tablet TAKE 1 TABLET BY MOUTH EVERY DAY 10/26/18   Elby Beck, FNP  citalopram (CELEXA) 40 MG tablet TAKE 1 TABLET DAILY-BEDTIME Patient taking differently: Take 40 mg by mouth at bedtime.  12/11/17   Elby Beck, FNP  ibuprofen (ADVIL,MOTRIN) 200 MG tablet Take 600 mg by mouth every 6 (six) hours as needed for headache or moderate pain.     [provider]  omeprazole (PRILOSEC OTC) 20 MG tablet Take 20 mg by mouth as needed.    [provider]  Pseudoephedrine-Naproxen Na (ALEVE COLD & SINUS PO) Take 1 tablet by mouth as needed.    [provider]  SUMAtriptan (IMITREX) 100 MG tablet TAKE 1/2 TO 1 TABLET AT MIGRAINE ONSET. MAY REPEAT IN 2 HOURS X 1 IF NEEDED. 11/22/18   Elby Beck, FNP  topiramate (TOPAMAX) 100 MG tablet Take 1 tablet (100 mg total) by mouth 2 (two) times daily. 12/11/17   Elby Beck, FNP    No Known Allergies  Family History  Problem Relation Age of Onset  .  Hypertension Mother   . Renal cancer Father   . Hypertension Father     Social History Social History   Tobacco Use  . Smoking status: Former Smoker    Years: 7.00    Types: Cigarettes    Quit date: 03/12/2009    Years since quitting: 9.7  . Smokeless tobacco: Never Used  . Tobacco comment: 1 PACK EVERY 2 WEEKS  Substance Use Topics  . Alcohol use: Yes    Comment: RARE  . Drug use: No    Review of Systems Constitutional: Negative for fever. Cardiovascular: Negative for chest pain. Respiratory: Negative for shortness of breath. Gastrointestinal: Left lower quadrant abdominal pain.  Negative for vomiting or diarrhea. Genitourinary: Negative for urinary compaints Musculoskeletal: Negative for musculoskeletal complaints Skin: Negative for skin complaints  Neurological: Negative for headache All other ROS negative  ____________________________________________   PHYSICAL EXAM:  VITAL SIGNS: ED Triage Vitals  Enc Vitals Group     BP 12/08/18 1552 (!) 140/96     Pulse Rate 12/08/18 1552 84     Resp 12/08/18 1552 18     Temp 12/08/18 1552 98.1 F (36.7 C)     Temp Source 12/08/18 1552 Oral     SpO2 12/08/18 1552 97 %     Weight 12/08/18 1553 224 lb (101.6 kg)     Height 12/08/18 1553 6\' 1"  (1.854 m)     Head Circumference --      Peak Flow --      Pain Score 12/08/18 1615 6     Pain Loc --      Pain Edu? --      Excl. in [place]? --    Constitutional: Alert and oriented. Well appearing and in no distress. Eyes: Normal exam ENT      Head: Normocephalic and atraumatic.      Mouth/Throat: Mucous membranes are moist. Cardiovascular: Normal rate, regular rhythm.  Respiratory: Normal respiratory effort without tachypnea nor retractions. Breath sounds are clear  Gastrointestinal: Soft, very slight very low left lower quadrant tenderness to palpation.  No rebound guarding or distention.  No right-sided tenderness. Musculoskeletal: Nontender with normal range of motion in all  extremities.  Neurologic:  Normal speech and language. No gross focal neurologic deficits Skin:  Skin is warm, dry and intact.    ____________________________________________   RADIOLOGY  IMPRESSION:  1. Minimally complex 2.6 cm left ovarian cyst with single internal  septation. This has benign characteristics and is a common finding  in premenopausal females. No imaging follow up is required. This  follows consensus guidelines: Simple Adnexal Cysts: SRU Consensus  Conference Update on Follow-up and Reporting. Radiology 2019;  [phone].  2. Unremarkable appearance of the right ovary.  3. Status post hysterectomy.   ____________________________________________   INITIAL IMPRESSION / ASSESSMENT AND PLAN / ED COURSE  Pertinent labs & imaging results that were available during my care of the patient were reviewed by me and considered in  my medical decision making (see chart for details).   Patient presents to the emergency department for low left lower quadrant pain.  Overall patient appears well, reassuring vitals, lab work is normal.  Patient has very slight very low left lower quadrant tenderness.  No rebound guarding or distention.  Patient has a history of ovarian cyst in the past which this feels somewhat similar.  We will proceed with an ultrasound to further evaluate.  Differential this time would include ovarian cyst, hemorrhagic cyst, diverticulitis or colitis.  Ultrasound confirms 2.6 cm left ovarian cyst, this is possibly the cause of the patient's discomfort.  We will discharge with a short course of pain medication and have the patient follow-up with her doctor.  Patient agreeable to plan of care.  SHARLENE SABIR was evaluated in Emergency Department on 12/08/2018 for the symptoms described in the history of present illness. She was evaluated in the context of the global COVID-19 pandemic, which necessitated consideration that the patient might be at risk for infection  with the SARS-CoV-2 virus that causes COVID-19. Institutional protocols and algorithms that pertain to the evaluation of patients at risk for COVID-19 are in a state of rapid change based on information released by regulatory bodies including the CDC and federal and state organizations. These policies and algorithms were followed during the patient's care in the ED.  ____________________________________________   FINAL CLINICAL IMPRESSION(S) / ED DIAGNOSES   left ovarian cyst.   Harvest Dark, MD 12/08/18 1745

## 2018-12-08 NOTE — Telephone Encounter (Signed)
Robin from [facility] asked if I could triage this pt for them.  Pt called in c/o severe left lower abd pain that radiates to back and side going on for 3 days.   (Since Monday).  She has IBS and PCOS with a history of ovarian cysts.  She had a hysterectomy but still has her ovaries.   She's very nauseas and has a poor appetite.  I have referred her to the ED per the protocol.   She is going to [facility] now.  I forwarded these notes to [facility] so they would be aware of the ED referral.      Reason for Disposition . [1] SEVERE pain (e.g., excruciating) AND [2] present > 1 hour  Answer Assessment - Initial Assessment Questions 1. LOCATION: "Where does it hurt?"      Left lower side abd pain. 2. RADIATION: "Does the pain shoot anywhere else?" (e.g., chest, back)     Yes  Into the left front 3. ONSET: "When did the pain begin?" (e.g., minutes, hours or days ago)      This is the 3rd day   (Monday) 4. SUDDEN: "Gradual or sudden onset?"     Suddenly.    5. PATTERN "Does the pain come and go, or is it constant?"    - If constant: "Is it getting better, staying the same, or worsening?"      (Note: Constant means the pain never goes away completely; most serious pain is constant and it progresses)     - If intermittent: "How long does it last?" "Do you have pain now?"     (Note: Intermittent means the pain goes away completely between bouts)     Constant pain since Monday 6. SEVERITY: "How bad is the pain?"  (e.g., Scale 1-10; mild, moderate, or severe)   - MILD (1-3): doesn't interfere with normal activities, abdomen soft and not tender to touch    - MODERATE (4-7): interferes with normal activities or awakens from sleep, tender to touch    - SEVERE (8-10): excruciating pain, doubled over, unable to do any normal activities      7 Moderate but it's been a 8 or higher.   I took a pain pill last night it was so bad.   I don't normally take  pain pills. 7. RECURRENT SYMPTOM: "Have you ever had this type of abdominal pain before?" If so, ask: "When was the last time?" and "What happened that time?"      About every month.     I have IBS maybe it's gas pain.     This time it's lasted longer.    I feel nauseas and not [place].   I feel miserable.   It doesn't usually last this many days. 8. CAUSE: "What do you think is causing the abdominal pain?"     Maybe a cyst.   I've had ovarian cysts.   I have PCOS. 9. RELIEVING/AGGRAVATING FACTORS: "What makes it better or worse?" (e.g., movement, antacids, bowel movement)     Nothing really 10. OTHER SYMPTOMS: "Has there been any vomiting, diarrhea, constipation, or urine problems?"       I feel more gasy.   I had a BM.  I feel real bloated and nauseas.   It hurts to walk and lay on my back. 11. PREGNANCY: "Is there any chance you are pregnant?" "When was your last menstrual period?"       Hysterectomy  but left my ovaries.  Protocols used: ABDOMINAL PAIN [facility]

## 2018-12-09 ENCOUNTER — Encounter: Payer: Self-pay | Admitting: Family Medicine

## 2018-12-10 ENCOUNTER — Ambulatory Visit (INDEPENDENT_AMBULATORY_CARE_PROVIDER_SITE_OTHER): Payer: BC Managed Care – PPO | Admitting: Family Medicine

## 2018-12-10 ENCOUNTER — Encounter: Payer: Self-pay | Admitting: Family Medicine

## 2018-12-10 ENCOUNTER — Other Ambulatory Visit: Payer: Self-pay

## 2018-12-10 VITALS — Ht 73.0 in | Wt 224.0 lb

## 2018-12-10 DIAGNOSIS — R1032 Left lower quadrant pain: Secondary | ICD-10-CM | POA: Diagnosis not present

## 2018-12-10 DIAGNOSIS — N83202 Unspecified ovarian cyst, left side: Secondary | ICD-10-CM

## 2018-12-10 MED ORDER — IBUPROFEN 800 MG PO TABS: 800.0000 mg | ORAL_TABLET | Freq: Three times a day (TID) | ORAL | 1 refills | Status: DC | PRN

## 2018-12-10 NOTE — Progress Notes (Signed)
Virtual Visit via Video Note  I connected with Caitlyn Harris on 12/10/18 at  8:00 AM EDT by a video enabled telemedicine application and verified that I am speaking with the correct person using two identifiers.  Location: Patient:In her home Provider: [place]   I discussed the limitations of evaluation and management by telemedicine and the availability of in person appointments. The patient expressed understanding and agreed to proceed.  History of Present Illness: Chief Complaint  Patient presents with  . Follow-up    ovarian cyst - still having pain since Monday 8/31. Pt states that every month she seems to get this pain on the LL pelvic, nausea and bloating, "dont feel good" - lasts about 1-2 days. This episode is lasting 3 days now. Seen in ED and ovarian cyst was found, advised to follow up wtih PCP. Discuss Hormonal therapy. Pt not able to work - debilitating.   This is a 38 yo female who presents today following recent ED visit for LLQ abdominal pain/ ovarian cyst. Pain started 5 days ago. Ultrasound showed 4.3 x 2.9 x 2.9 left ovarian cyst. She has had frequent problems with cyclical, monthly pain in the past. She was previously on OCPs with improvement of symptoms. She has currently tried ibuprofen 600 mg, oxycodone, heat with improvement with ibuprofen and heat. She has had accompanying nausea without vomiting, bloating, gas. Symptoms are significantly improved today.   Increased stress. Her step son has had increased depression and thoughts of self harm. He is being seen by psychiatry and psychology.   Past Medical History:  Diagnosis Date  . Anxiety   . Depression   . GERD (gastroesophageal reflux disease)    OCC  . Hyperlipidemia   . Migraine   . Pre-eclampsia   . Seizures ([place])    FEBRILE AS A BABY   Past Surgical History:  Procedure Laterality Date  . ABDOMINAL HYSTERECTOMY    . CERVICAL BIOPSY  W/ LOOP ELECTRODE EXCISION    . ECTOPIC PREGNANCY  SURGERY    . endometirosis    . HAND SURGERY     THUMB SURGERY  . LAPAROSCOPIC OVARIAN CYSTECTOMY Left 03/02/2018   Procedure: LAPAROSCOPIC RIGHT OVARIAN CYSTECTOMY;  Surgeon: Gae Dry, MD;  Location: [facility] OR[facility];  Service: Gynecology;  Laterality: Left;  . LEEP    . LEEP    . PERINEOPLASTY N/A 03/02/2018   Procedure: PERINEORRHAPHY;  Surgeon: Gae Dry, MD;  Location: [facility];  Service: Gynecology;  Laterality: N/A;  . RECTOCELE REPAIR N/A 03/02/2018   Procedure: POSTERIOR REPAIR (RECTOCELE);  Surgeon: Gae Dry, MD;  Location: [facility];  Service: Gynecology;  Laterality: N/A;  . VENTRAL HERNIA REPAIR N/A 03/19/2017   Procedure: HERNIA REPAIR VENTRAL ADULT;  Surgeon: Florene Glen, MD;  Location: [facility] ORS;  Service: General;  Laterality: N/A;   Family History  Problem Relation Age of Onset  . Hypertension Mother   . Renal cancer Father   . Hypertension Father    Social History   Tobacco Use  . Smoking status: Former Smoker    Years: 7.00    Types: Cigarettes    Quit date: 03/12/2009    Years since quitting: 9.7  . Smokeless tobacco: Never Used  . Tobacco comment: 1 PACK EVERY 2 WEEKS  Substance Use Topics  . Alcohol use: Yes    Comment: RARE  . Drug use: No      Observations/Objective: The patient is alert and answers questions appropriately. Visible  skin is unremarkable. Conjunctiva are clear. Respirations are even and unlabored, no audible wheeze or cough. Mood and affect are appropriate.   Ht 6\' 1"  (1.854 m)   Wt 224 lb (101.6 kg)   BMI 29.55 kg/m  Wt Readings from Last 3 Encounters:  12/09/18 224 lb (101.6 kg)  12/08/18 224 lb (101.6 kg)  09/24/18 225 lb 8 oz (102.3 kg)    Assessment and Plan: 1. LLQ abdominal pain - pain improved today, will continue analgesics, heat prn - ibuprofen (ADVIL) 800 MG tablet; Take 1 tablet (800 mg total) by mouth every 8 (eight) hours as needed.  Dispense: 30 tablet; Refill: 1  2. Cyst of left ovary -  I have reached out to Dr. Kenton Kingfisher, her gyn, regarding preferences for hormone therapy - ibuprofen (ADVIL) 800 MG tablet; Take 1 tablet (800 mg total) by mouth every 8 (eight) hours as needed.  Dispense: 30 tablet; Refill: [place], FNP-BC  [facility]  12/10/2018 10:55 AM   Follow Up Instructions:    I discussed the assessment and treatment plan with the patient. The patient was provided an opportunity to ask questions and all were answered. The patient agreed with the plan and demonstrated an understanding of the instructions.   The patient was advised to call back or seek an in-person evaluation if the symptoms worsen or if the condition fails to improve as anticipated.   Elby Beck, FNP

## 2018-12-21 ENCOUNTER — Encounter: Payer: Self-pay | Admitting: Family Medicine

## 2019-01-09 ENCOUNTER — Other Ambulatory Visit: Payer: Self-pay | Admitting: Family Medicine

## 2019-01-09 DIAGNOSIS — F330 Major depressive disorder, recurrent, mild: Secondary | ICD-10-CM

## 2019-01-11 ENCOUNTER — Ambulatory Visit (INDEPENDENT_AMBULATORY_CARE_PROVIDER_SITE_OTHER): Payer: BC Managed Care – PPO

## 2019-01-11 DIAGNOSIS — Z23 Encounter for immunization: Secondary | ICD-10-CM

## 2019-01-11 NOTE — Telephone Encounter (Signed)
Pt is completely out of medicine and needs this prescription sent to [facility] [address] as soon as possible.  Citalopram 40 mg  CB [phone]

## 2019-01-12 NOTE — Telephone Encounter (Signed)
Last filled 12/11/2017, #90 x 3 Last OV 12/10/18 acute visit No upcoming visits on file

## 2019-01-19 ENCOUNTER — Other Ambulatory Visit: Payer: Self-pay | Admitting: Family Medicine

## 2019-01-19 DIAGNOSIS — F329 Major depressive disorder, single episode, unspecified: Secondary | ICD-10-CM

## 2019-01-28 ENCOUNTER — Other Ambulatory Visit: Payer: Self-pay

## 2019-01-28 DIAGNOSIS — Z20828 Contact with and (suspected) exposure to other viral communicable diseases: Secondary | ICD-10-CM | POA: Diagnosis not present

## 2019-01-28 DIAGNOSIS — Z20822 Contact with and (suspected) exposure to covid-19: Secondary | ICD-10-CM

## 2019-01-30 LAB — NOVEL CORONAVIRUS, NAA: SARS-CoV-2, NAA: NOT DETECTED

## 2019-02-08 ENCOUNTER — Other Ambulatory Visit: Payer: Self-pay | Admitting: Family Medicine

## 2019-02-08 DIAGNOSIS — F329 Major depressive disorder, single episode, unspecified: Secondary | ICD-10-CM

## 2019-02-08 DIAGNOSIS — G43709 Chronic migraine without aura, not intractable, without status migrainosus: Secondary | ICD-10-CM

## 2019-02-09 ENCOUNTER — Other Ambulatory Visit: Payer: Self-pay | Admitting: Family Medicine

## 2019-02-09 DIAGNOSIS — G43709 Chronic migraine without aura, not intractable, without status migrainosus: Secondary | ICD-10-CM

## 2019-02-09 NOTE — Telephone Encounter (Signed)
Last filled : Topamax 12/12/2018 #180 x 3 refills Wellbutrin 10/26/2018 #90 x 0 refills  --- advised follow needed for further refills.   Pt seen acutely on 12/10/2018 for Abd pain.  No upcoming appts scheduled.

## 2019-02-09 NOTE — Telephone Encounter (Signed)
Duplicate message. 

## 2019-03-23 ENCOUNTER — Other Ambulatory Visit: Payer: Self-pay

## 2019-03-23 ENCOUNTER — Emergency Department (HOSPITAL_COMMUNITY)
Admission: EM | Admit: 2019-03-23 | Discharge: 2019-03-23 | Disposition: A | Discharge status: Home / Self Care | Payer: No Typology Code available for payment source | Attending: Emergency Medicine | Admitting: Emergency Medicine

## 2019-03-23 ENCOUNTER — Encounter (HOSPITAL_COMMUNITY): Payer: Self-pay | Admitting: Emergency Medicine

## 2019-03-23 ENCOUNTER — Emergency Department (HOSPITAL_COMMUNITY): Payer: No Typology Code available for payment source

## 2019-03-23 DIAGNOSIS — Z87891 Personal history of nicotine dependence: Secondary | ICD-10-CM | POA: Insufficient documentation

## 2019-03-23 DIAGNOSIS — S161XXA Strain of muscle, fascia and tendon at neck level, initial encounter: Secondary | ICD-10-CM | POA: Diagnosis not present

## 2019-03-23 DIAGNOSIS — R52 Pain, unspecified: Secondary | ICD-10-CM | POA: Diagnosis not present

## 2019-03-23 DIAGNOSIS — Y999 Unspecified external cause status: Secondary | ICD-10-CM | POA: Insufficient documentation

## 2019-03-23 DIAGNOSIS — S199XXA Unspecified injury of neck, initial encounter: Secondary | ICD-10-CM | POA: Diagnosis present

## 2019-03-23 DIAGNOSIS — Y92411 Interstate highway as the place of occurrence of the external cause: Secondary | ICD-10-CM | POA: Insufficient documentation

## 2019-03-23 DIAGNOSIS — Y93I9 Activity, other involving external motion: Secondary | ICD-10-CM | POA: Diagnosis not present

## 2019-03-23 DIAGNOSIS — R0789 Other chest pain: Secondary | ICD-10-CM | POA: Diagnosis not present

## 2019-03-23 DIAGNOSIS — Z79899 Other long term (current) drug therapy: Secondary | ICD-10-CM | POA: Insufficient documentation

## 2019-03-23 DIAGNOSIS — S50811A Abrasion of right forearm, initial encounter: Secondary | ICD-10-CM | POA: Diagnosis not present

## 2019-03-23 MED ORDER — TRAMADOL HCL 50 MG PO TABS: 50.0000 mg | ORAL_TABLET | Freq: Four times a day (QID) | ORAL | 0 refills | Status: DC | PRN

## 2019-03-23 MED ORDER — IBUPROFEN 800 MG PO TABS: 800.0000 mg | ORAL_TABLET | Freq: Three times a day (TID) | ORAL | 0 refills | Status: DC | PRN

## 2019-03-23 MED ORDER — CYCLOBENZAPRINE HCL 10 MG PO TABS: 10.0000 mg | ORAL_TABLET | Freq: Every day | ORAL | 0 refills | Status: DC

## 2019-03-23 NOTE — Discharge Instructions (Addendum)
Return here as needed.  Your x-rays did not show any significant abnormalities at this time.  You can expect to be more sore later today and over the next 10 days.  Use ice and heat on the areas that are painful.  If you have any changes or worsening in your condition need to return here for a recheck.  I would also advise you to follow-up with your primary doctor for recheck.
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
## 2019-03-24 ENCOUNTER — Encounter: Payer: Self-pay | Admitting: Internal Medicine

## 2019-03-24 ENCOUNTER — Ambulatory Visit (INDEPENDENT_AMBULATORY_CARE_PROVIDER_SITE_OTHER): Payer: BC Managed Care – PPO | Admitting: Internal Medicine

## 2019-03-24 ENCOUNTER — Ambulatory Visit: Payer: BC Managed Care – PPO | Admitting: Internal Medicine

## 2019-03-24 VITALS — BP 122/72 | HR 91 | Temp 97.8°F | Wt 236.0 lb

## 2019-03-24 DIAGNOSIS — M542 Cervicalgia: Secondary | ICD-10-CM

## 2019-03-24 DIAGNOSIS — G44311 Acute post-traumatic headache, intractable: Secondary | ICD-10-CM

## 2019-03-24 DIAGNOSIS — M25511 Pain in right shoulder: Secondary | ICD-10-CM | POA: Diagnosis not present

## 2019-03-24 DIAGNOSIS — M546 Pain in thoracic spine: Secondary | ICD-10-CM | POA: Diagnosis not present
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
## 2019-03-24 NOTE — Patient Instructions (Signed)

## 2019-03-30 ENCOUNTER — Ambulatory Visit (INDEPENDENT_AMBULATORY_CARE_PROVIDER_SITE_OTHER): Payer: BC Managed Care – PPO | Admitting: Internal Medicine

## 2019-03-30 ENCOUNTER — Ambulatory Visit (INDEPENDENT_AMBULATORY_CARE_PROVIDER_SITE_OTHER)
Admission: RE | Admit: 2019-03-30 | Discharge: 2019-03-30 | Disposition: A | Discharge status: Home / Self Care | Payer: BC Managed Care – PPO | Source: Ambulatory Visit | Attending: Internal Medicine | Admitting: Internal Medicine

## 2019-03-30 ENCOUNTER — Other Ambulatory Visit: Payer: Self-pay

## 2019-03-30 VITALS — BP 116/72 | HR 90 | Temp 97.7°F | Wt 236.0 lb

## 2019-03-30 DIAGNOSIS — M549 Dorsalgia, unspecified: Secondary | ICD-10-CM

## 2019-03-30 DIAGNOSIS — M542 Cervicalgia: Secondary | ICD-10-CM

## 2019-03-30 DIAGNOSIS — R202 Paresthesia of skin: Secondary | ICD-10-CM

## 2019-03-30 DIAGNOSIS — M25511 Pain in right shoulder: Secondary | ICD-10-CM

## 2019-03-30 MED ORDER — PREDNISONE 10 MG PO TABS: ORAL_TABLET | ORAL | 0 refills | Status: DC

## 2019-03-30 MED ORDER — TIZANIDINE HCL 4 MG PO CAPS: 4.0000 mg | ORAL_CAPSULE | Freq: Three times a day (TID) | ORAL | 0 refills | Status: DC | PRN
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
## 2019-04-03 ENCOUNTER — Encounter: Payer: Self-pay | Admitting: Internal Medicine

## 2019-04-03 NOTE — Patient Instructions (Signed)

## 2019-04-09 ENCOUNTER — Other Ambulatory Visit: Payer: Self-pay | Admitting: Family Medicine

## 2019-04-09 DIAGNOSIS — G43709 Chronic migraine without aura, not intractable, without status migrainosus: Secondary | ICD-10-CM

## 2019-04-11 ENCOUNTER — Telehealth: Payer: Self-pay
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
## 2019-04-14 NOTE — Telephone Encounter (Signed)
Last refilled 11/22/2018 #10 x 0 rf Last OV 03/30/2019  Please advise, thanks.

## 2019-05-14 ENCOUNTER — Other Ambulatory Visit: Payer: Self-pay | Admitting: Family Medicine

## 2019-05-14 DIAGNOSIS — G43709 Chronic migraine without aura, not intractable, without status migrainosus: Secondary | ICD-10-CM

## 2019-05-17 NOTE — Telephone Encounter (Signed)
Last refilled 04/15/2019 #10 x 0 rf Last OV 03/30/2019  Please advise, thanks.

## 2019-06-16 ENCOUNTER — Other Ambulatory Visit: Payer: Self-pay | Admitting: Family Medicine

## 2019-06-16 DIAGNOSIS — G43709 Chronic migraine without aura, not intractable, without status migrainosus: Secondary | ICD-10-CM

## 2019-06-16 NOTE — Telephone Encounter (Signed)
Last refill 05/14/19 Last OV 03/30/2019  Please advise, thanks.   Pt also sent an email requesting this refill.

## 2019-06-17 ENCOUNTER — Encounter: Payer: Self-pay | Admitting: Family Medicine

## 2019-06-22 ENCOUNTER — Telehealth: Payer: Self-pay | Admitting: Family Medicine

## 2019-06-22 NOTE — Telephone Encounter (Signed)
Cancelling PT Referral after several attempts to contact patient and they never called [place] back.

## 2019-06-22 NOTE — Telephone Encounter (Signed)
Noted  

## 2019-07-11 IMAGING — CT CT ABD-PELV W/ CM
2 of 4 series · 16 of 46 positions shown, 18 images · IV contrast (APPLIED)
Comparison: None.

CLINICAL DATA: Abdominal pain following coughing episode

EXAM:
CT ABDOMEN AND PELVIS WITH CONTRAST
TECHNIQUE: Multidetector CT imaging of the abdomen and pelvis was performed
using the standard protocol following bolus administration of
intravenous contrast.
CONTRAST:  100mL XPSD84-8FF IOPAMIDOL (XPSD84-8FF) INJECTION 61%

[Series 2: axial st · axial · 0.84mm/px · z∈[-873,-423]mm · 13 of 98 slices shown, 15 images]
[im 4/98  soft-tissue]
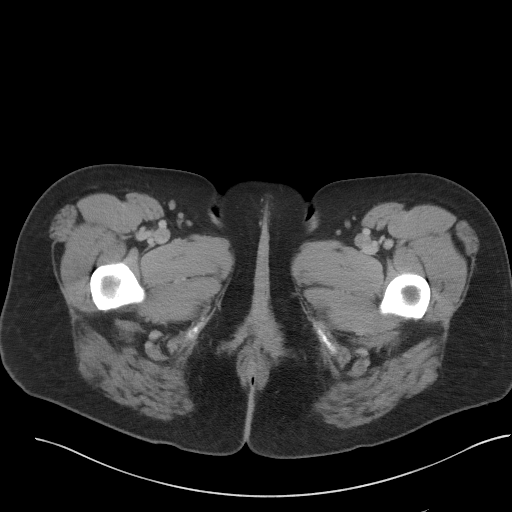
[im 4/98  bone]
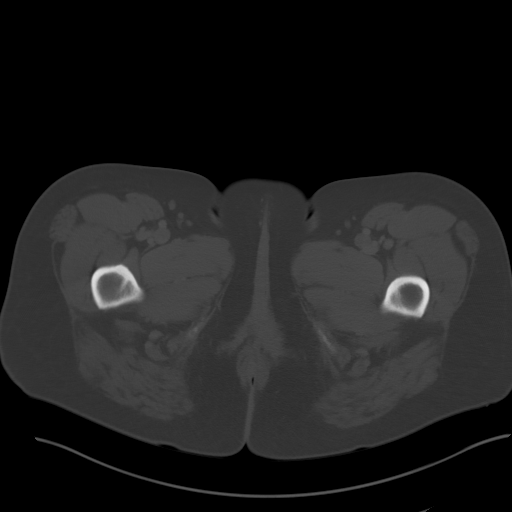
[im 12/98  soft-tissue]
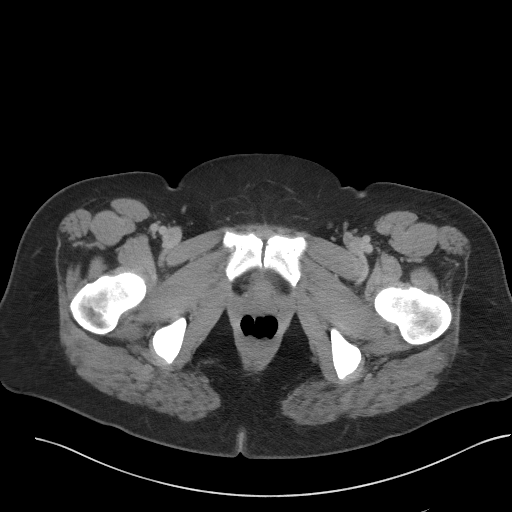
[im 20/98  soft-tissue]
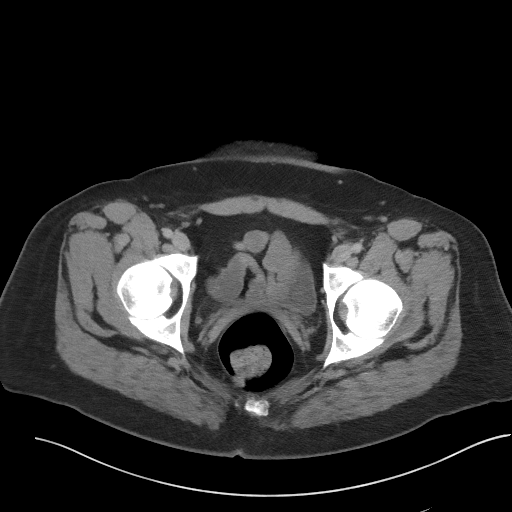
[im 28/98  soft-tissue]
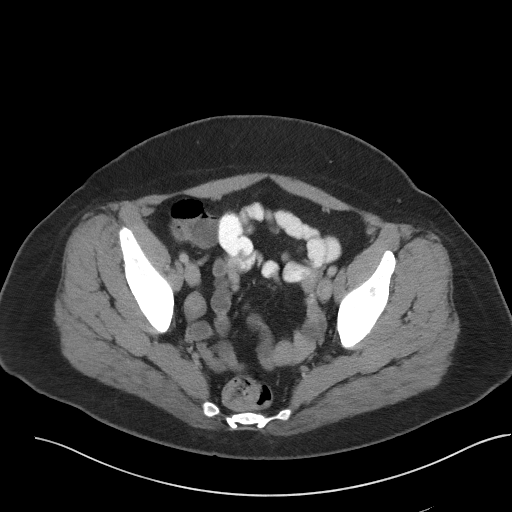
[im 35/98  soft-tissue]
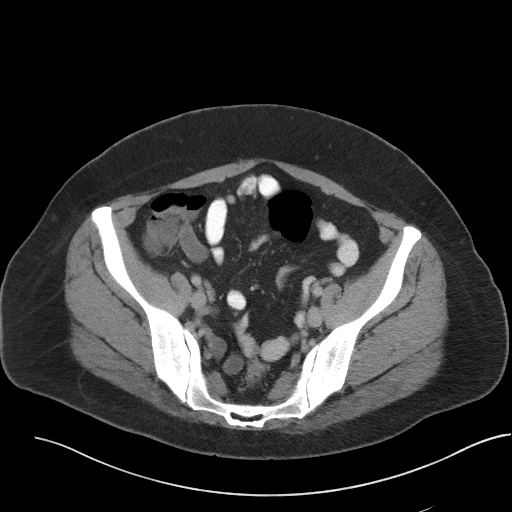
[im 43/98  soft-tissue]
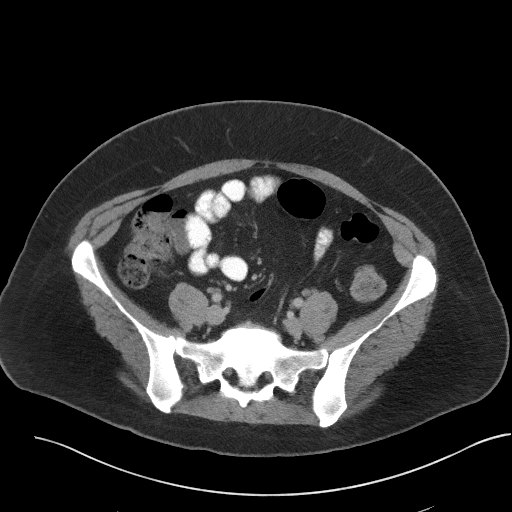
[im 51/98  soft-tissue]
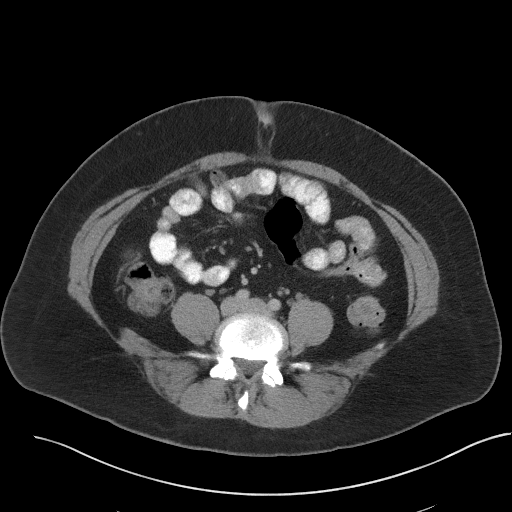
[im 55/98  soft-tissue]
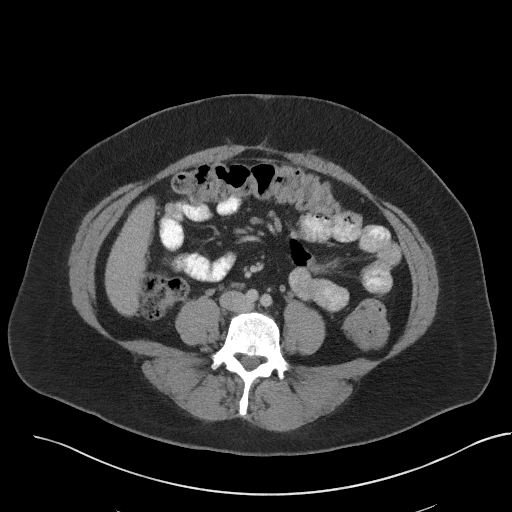
[im 63/98  soft-tissue]
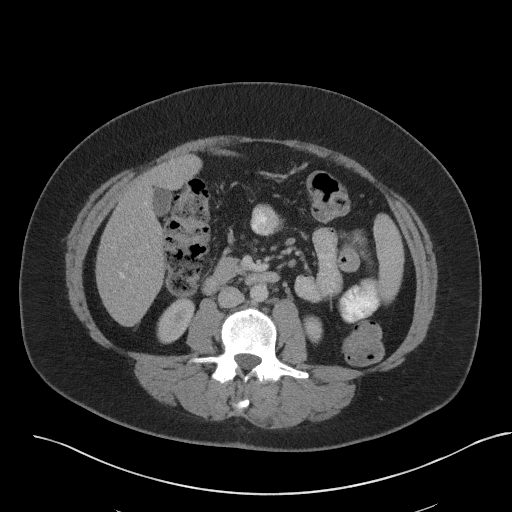
[im 63/98  bone]
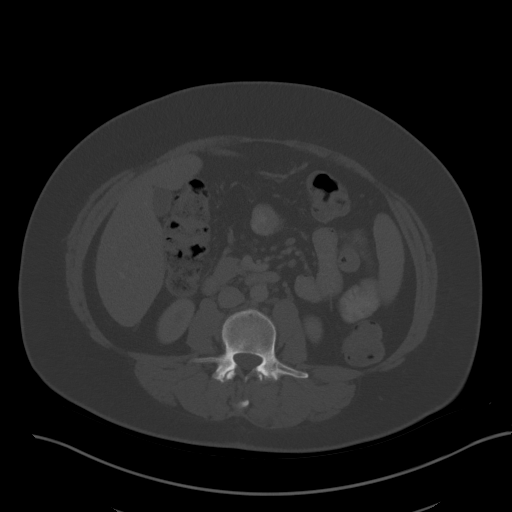
[im 70/98  soft-tissue]
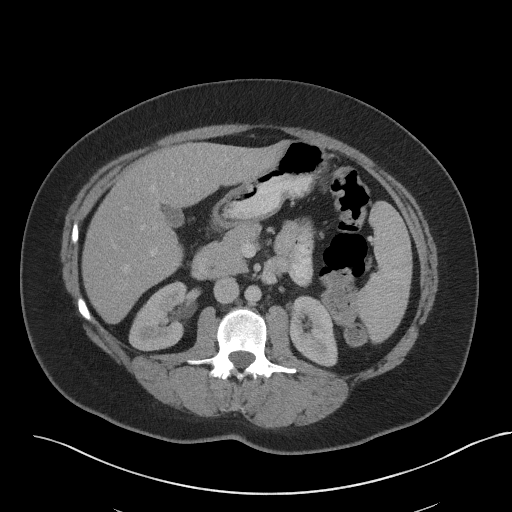
[im 78/98  soft-tissue]
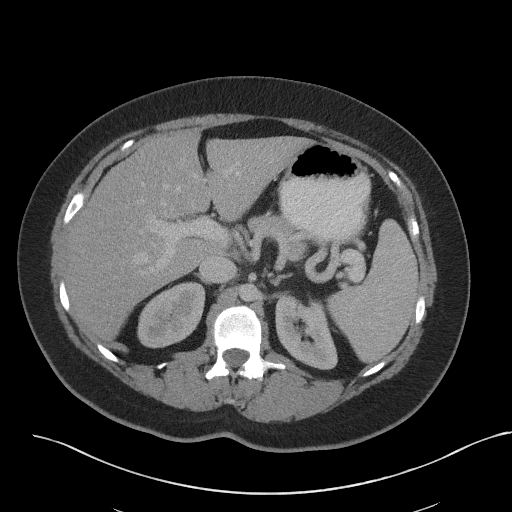
[im 86/98  soft-tissue]
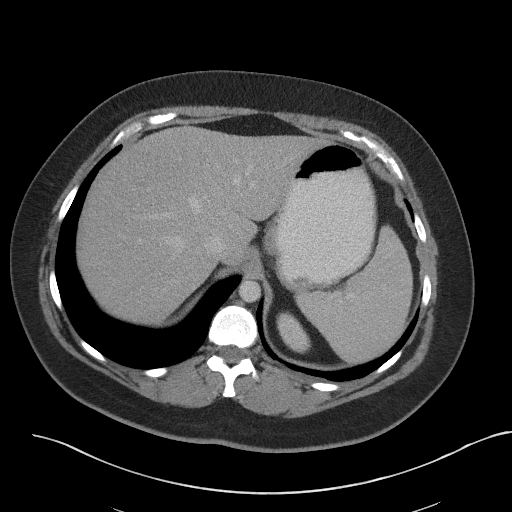
[im 94/98  soft-tissue]
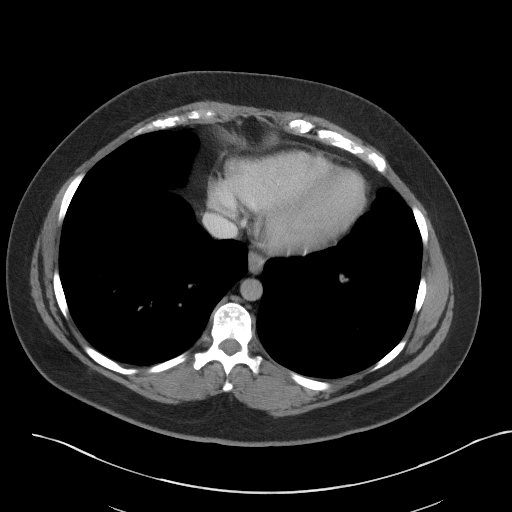

[Series 5: coronal st · coronal · 0.68mm/px · 3 of 95 slices shown]
[im 32/95  soft-tissue]
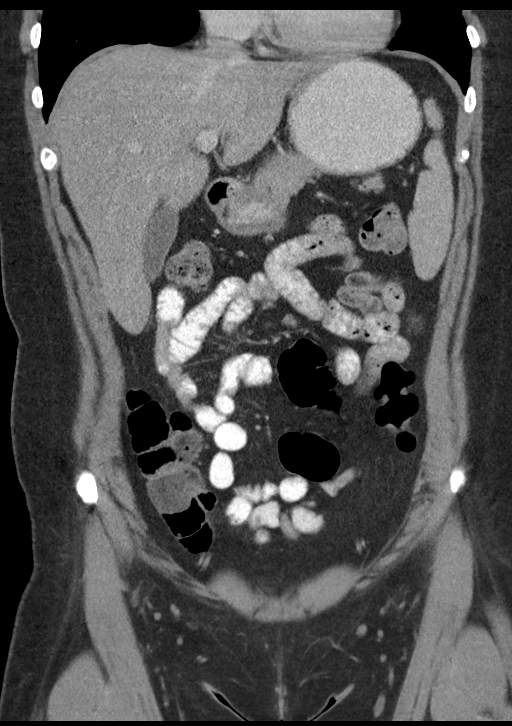
[im 42/95  soft-tissue]
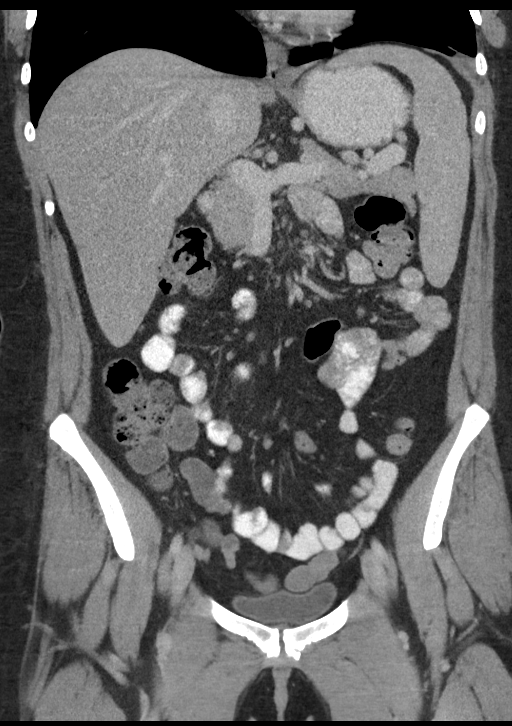
[im 53/95  soft-tissue]
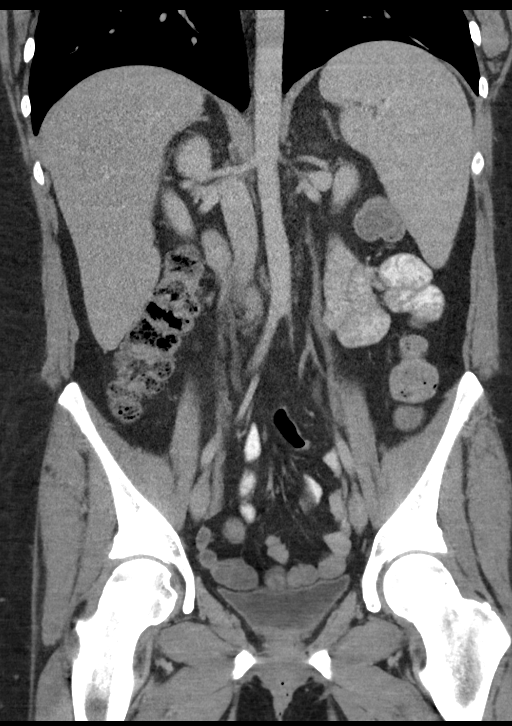

[16 of 46 positions shown; findings below may reference images not displayed]

FINDINGS: Lower chest: Lung bases demonstrate minimal atelectatic changes
laterally on the left.

Hepatobiliary: No focal liver abnormality is seen. No gallstones,
gallbladder wall thickening, or biliary dilatation.

Pancreas: Unremarkable. No pancreatic ductal dilatation or
surrounding inflammatory changes.

Spleen: Normal in size without focal abnormality.

Adrenals/Urinary Tract: Adrenal glands are unremarkable. Kidneys are
normal, without renal calculi, focal lesion, or hydronephrosis.
Bladder is unremarkable.

Stomach/Bowel: The appendix is within normal limits. No obstructive
or inflammatory changes are seen.

Vascular/Lymphatic: No significant vascular findings are present. No
enlarged abdominal or pelvic lymph nodes.

Reproductive: Status post hysterectomy. No adnexal masses.

Other: Small fat containing umbilical hernia is noted. No other
focal abnormality is seen.

Musculoskeletal: Bony structures are within normal limits.
IMPRESSION: Small fat containing umbilical hernia.

No other focal abnormality is noted.

## 2019-07-25 ENCOUNTER — Other Ambulatory Visit: Payer: Self-pay

## 2019-07-25 ENCOUNTER — Emergency Department
Admission: EM | Admit: 2019-07-25 | Discharge: 2019-07-25 | Disposition: A | Discharge status: Home / Self Care | Payer: BC Managed Care – PPO | Attending: Emergency Medicine | Admitting: Emergency Medicine

## 2019-07-25 ENCOUNTER — Emergency Department: Payer: BC Managed Care – PPO

## 2019-07-25 DIAGNOSIS — Z87891 Personal history of nicotine dependence: Secondary | ICD-10-CM | POA: Diagnosis not present

## 2019-07-25 DIAGNOSIS — R110 Nausea: Secondary | ICD-10-CM | POA: Insufficient documentation

## 2019-07-25 DIAGNOSIS — R1032 Left lower quadrant pain: Secondary | ICD-10-CM | POA: Diagnosis not present

## 2019-07-25 DIAGNOSIS — R1030 Lower abdominal pain, unspecified: Secondary | ICD-10-CM | POA: Insufficient documentation

## 2019-07-25 DIAGNOSIS — K429 Umbilical hernia without obstruction or gangrene: Secondary | ICD-10-CM | POA: Diagnosis not present

## 2019-07-25 DIAGNOSIS — N83202 Unspecified ovarian cyst, left side: Secondary | ICD-10-CM | POA: Diagnosis not present

## 2019-07-25 DIAGNOSIS — N83292 Other ovarian cyst, left side: Secondary | ICD-10-CM | POA: Diagnosis not present

## 2019-07-25 DIAGNOSIS — R102 Pelvic and perineal pain: Secondary | ICD-10-CM

## 2019-07-25 LAB — CBC
HCT: 39.7 % (ref 36.0–46.0)
Hemoglobin: 14.3 g/dL (ref 12.0–15.0)
MCH: 31.9 pg (ref 26.0–34.0)
MCHC: 36 g/dL (ref 30.0–36.0)
MCV: 88.6 fL (ref 80.0–100.0)
Platelets: 203 10*3/uL (ref 150–400)
RBC: 4.48 MIL/uL (ref 3.87–5.11)
RDW: 12 % (ref 11.5–15.5)
WBC: 8 10*3/uL (ref 4.0–10.5)
nRBC: 0 % (ref 0.0–0.2)

## 2019-07-25 LAB — COMPREHENSIVE METABOLIC PANEL
ALT: 23 U/L (ref 0–44)
AST: 20 U/L (ref 15–41)
Albumin: 4.6 g/dL (ref 3.5–5.0)
Alkaline Phosphatase: 53 U/L (ref 38–126)
Anion gap: 8 (ref 5–15)
BUN: 9 mg/dL (ref 6–20)
CO2: 20 mmol/L — ABNORMAL LOW (ref 22–32)
Calcium: 8.7 mg/dL — ABNORMAL LOW (ref 8.9–10.3)
Chloride: 107 mmol/L (ref 98–111)
Creatinine, Ser: 0.78 mg/dL (ref 0.44–1.00)
GFR calc Af Amer: >60 mL/min (ref >60)
GFR calc non Af Amer: >60 mL/min (ref >60)
Glucose, Bld: 126 mg/dL — ABNORMAL HIGH (ref 70–99)
Potassium: 3.8 mmol/L (ref 3.5–5.1)
Sodium: 135 mmol/L (ref 135–145)
Total Bilirubin: 0.7 mg/dL (ref 0.3–1.2)
Total Protein: 7.3 g/dL (ref 6.5–8.1)

## 2019-07-25 LAB — URINALYSIS, COMPLETE (UACMP) WITH MICROSCOPIC
Bacteria, UA: NONE SEEN
Bilirubin Urine: NEGATIVE
Glucose, UA: NEGATIVE mg/dL
Hgb urine dipstick: NEGATIVE
Ketones, ur: NEGATIVE mg/dL
Leukocytes,Ua: NEGATIVE
Nitrite: NEGATIVE
Protein, ur: NEGATIVE mg/dL
Specific Gravity, Urine: 1.019 (ref 1.005–1.030)
pH: 5 (ref 5.0–8.0)

## 2019-07-25 LAB — LIPASE, BLOOD: Lipase: 35 U/L (ref 11–51)

## 2019-07-25 LAB — PREGNANCY, URINE: Preg Test, Ur: NEGATIVE

## 2019-07-25 MED ORDER — MORPHINE SULFATE (PF) 4 MG/ML IV SOLN
4.0000 mg | INTRAVENOUS | Status: DC | PRN
  Administered 2019-07-25: 4 mg via INTRAVENOUS
  Filled 2019-07-25: qty 1

## 2019-07-25 MED ORDER — KETOROLAC TROMETHAMINE 30 MG/ML IJ SOLN
30.0000 mg | Freq: Once | INTRAMUSCULAR | Status: AC
  Administered 2019-07-25: 13:00:00 30 mg via INTRAMUSCULAR
  Filled 2019-07-25: qty 1

## 2019-07-25 MED ORDER — HYDROCODONE-ACETAMINOPHEN 5-325 MG PO TABS: 1.0000 | ORAL_TABLET | ORAL | 0 refills | Status: DC | PRN

## 2019-07-25 MED ORDER — IOHEXOL 300 MG/ML  SOLN
125.0000 mL | Freq: Once | INTRAMUSCULAR | Status: AC | PRN
  Administered 2019-07-25: 125 mL via INTRAVENOUS

## 2019-07-25 MED ORDER — ONDANSETRON HCL 4 MG/2ML IJ SOLN
4.0000 mg | Freq: Once | INTRAMUSCULAR | Status: AC
  Administered 2019-07-25: 4 mg via INTRAVENOUS
  Filled 2019-07-25: qty 2

## 2019-07-25 NOTE — ED Provider Notes (Signed)
[facility] Emergency Department Provider Note ____________________________________________   First MD Initiated Contact with Patient 07/25/19 1246     (approximate)  I have reviewed the triage vital signs and the nursing notes.   HISTORY  Chief Complaint Abdominal Pain and Nausea    HPI Caitlyn Harris is a 39 y.o. female with PMH as noted below who presents with left lower quadrant/left suprapubic area pain, acute onset earlier this morning, constant, but somewhat improved in intensity.  It is associated with nausea but no vomiting.  The patient denies any vaginal bleeding or discharge.  She is status post hysterectomy.  She has no urinary symptoms or diarrhea.  She states that she had a decreased appetite last night.  Past Medical History:  Diagnosis Date  . Anxiety   . Depression   . GERD (gastroesophageal reflux disease)    OCC  . Hyperlipidemia   . Migraine   . Seizures ([place])    FEBRILE AS A BABY    Patient Active Problem List   Diagnosis Date Noted  . Hypertriglyceridemia 06/11/2018  . Candida vaginitis 04/09/2018  . Endometriosis 02/01/2018  . Bilateral ovarian cysts 02/01/2018  . Chronic female pelvic pain 02/01/2018  . Pelvic pain 01/18/2018  . History of endometriosis 01/18/2018  . Rectocele 01/18/2018  . Insomnia 08/25/2017  . Cystocele, midline 08/25/2017  . Chronic constipation 08/25/2017  . Umbilical hernia without obstruction and without gangrene   . Chronic migraine without aura without status migrainosus, not intractable 02/04/2016  . Depression 02/04/2016  . Generalized anxiety disorder 02/04/2016  . Vitamin D deficiency, unspecified 02/04/2016    Past Surgical History:  Procedure Laterality Date  . ABDOMINAL HYSTERECTOMY    . CERVICAL BIOPSY  W/ LOOP ELECTRODE EXCISION    . ECTOPIC PREGNANCY SURGERY    . endometirosis    . HAND SURGERY     THUMB SURGERY  . LAPAROSCOPIC OVARIAN CYSTECTOMY Left 03/02/2018   Procedure: LAPAROSCOPIC RIGHT OVARIAN CYSTECTOMY;  Surgeon: Gae Dry, MD;  Location: [facility] ORS;  Service: Gynecology;  Laterality: Left;  . LEEP    . LEEP    . PERINEOPLASTY N/A 03/02/2018   Procedure: PERINEORRHAPHY;  Surgeon: Gae Dry, MD;  Location: [facility] ORS;  Service: Gynecology;  Laterality: N/A;  . RECTOCELE REPAIR N/A 03/02/2018   Procedure: POSTERIOR REPAIR (RECTOCELE);  Surgeon: Gae Dry, MD;  Location: [facility] ORS;  Service: Gynecology;  Laterality: N/A;  . VENTRAL HERNIA REPAIR N/A 03/19/2017   Procedure: HERNIA REPAIR VENTRAL ADULT;  Surgeon: Florene Glen, MD;  Location: [facility] ORS;  Service: General;  Laterality: N/A;    Prior to Admission medications   Medication Sig Start Date End Date Taking? Authorizing Provider  acetaminophen (TYLENOL) 500 MG tablet Take 1,000 mg by mouth every 6 (six) hours as needed for moderate pain or headache.    [provider]  buPROPion (WELLBUTRIN XL) 150 MG 24 hr tablet TAKE 1 TABLET BY MOUTH EVERY DAY 02/09/19   Elby Beck, FNP  citalopram (CELEXA) 40 MG tablet TAKE 1 TABLET BY MOUTH DAILY AT BEDTIME 01/12/19   Elby Beck, FNP  cyclobenzaprine (FLEXERIL) 10 MG tablet Take 1 tablet (10 mg total) by mouth at bedtime. 03/23/19   Lawyer, Harrell Gave, PA-C  ibuprofen (ADVIL) 800 MG tablet Take 1 tablet (800 mg total) by mouth every 8 (eight) hours as needed. 12/10/18   Elby Beck, FNP  ibuprofen (ADVIL) 800 MG tablet Take 1 tablet (800 mg total)  by mouth every 8 (eight) hours as needed. 03/23/19   Lawyer, Harrell Gave, PA-C  omeprazole (PRILOSEC OTC) 20 MG tablet Take 20 mg by mouth as needed.    [provider]  predniSONE (DELTASONE) 10 MG tablet Take 3 tabs on days 1-3, take 2 tabs on days 4-6, take 1 tab on days 7-9 03/30/19   Jearld Fenton, NP  Pseudoephedrine-Naproxen Na (ALEVE COLD & SINUS PO) Take 1 tablet by mouth as needed.    [provider]  SUMAtriptan (IMITREX) 100 MG  tablet TAKE 1/2 TO 1 TABLET AT MIGRAINE ONSET. MAY REPEAT IN 2 HOURS X 1 IF NEEDED. 06/17/19   Elby Beck, FNP  tiZANidine (ZANAFLEX) 4 MG capsule Take 1 capsule (4 mg total) by mouth 3 (three) times daily as needed for muscle spasms. 03/30/19   Jearld Fenton, NP  topiramate (TOPAMAX) 100 MG tablet TAKE 1 TABLET BY MOUTH TWICE A DAY 02/09/19   Elby Beck, FNP  traMADol (ULTRAM) 50 MG tablet Take 1 tablet (50 mg total) by mouth every 6 (six) hours as needed for severe pain. 03/23/19   Dalia Heading, PA-C    Allergies Patient has no known allergies.  Family History  Problem Relation Age of Onset  . Hypertension Mother   . Diabetes Mother   . Renal cancer Father   . Hypertension Father     Social History Social History   Tobacco Use  . Smoking status: Former Smoker    Years: 7.00    Types: Cigarettes    Quit date: 03/12/2009    Years since quitting: 10.3  . Smokeless tobacco: Never Used  . Tobacco comment: 1 PACK EVERY 2 WEEKS  Substance Use Topics  . Alcohol use: Yes    Comment: RARE  . Drug use: No    Review of Systems  Constitutional: No fever. Eyes: No redness. ENT: No sore throat. Cardiovascular: Denies chest pain. Respiratory: Denies shortness of breath. Gastrointestinal: Positive for nausea. Genitourinary: Negative for dysuria.  Musculoskeletal: Negative for back pain. Skin: Negative for rash. Neurological: Negative for headache.   ____________________________________________   PHYSICAL EXAM:  VITAL SIGNS: ED Triage Vitals  Enc Vitals Group     BP 07/25/19 0836 137/83     Pulse Rate 07/25/19 0836 84     Resp 07/25/19 0836 18     Temp 07/25/19 0836 98.5 F (36.9 C)     Temp Source 07/25/19 0836 Oral     SpO2 07/25/19 0836 97 %     Weight 07/25/19 0836 250 lb (113.4 kg)     Height 07/25/19 0836 6\' 1"  (1.854 m)     Head Circumference --      Peak Flow --      Pain Score 07/25/19 0840 6     Pain Loc --      Pain Edu? --       Excl. in [place]? --     Constitutional: Alert and oriented.  Relatively well appearing and in no acute distress. Eyes: Conjunctivae are normal.  Head: Atraumatic. Nose: No congestion/rhinnorhea. Mouth/Throat: Mucous membranes are moist.   Neck: Normal range of motion.  Cardiovascular: Good peripheral circulation. Respiratory: Normal respiratory effort.  No retractions.  Gastrointestinal: Soft with mild left suprapubic tenderness.  No distention.  Genitourinary: No flank tenderness. Musculoskeletal:  Extremities warm and well perfused.  Neurologic:  Normal speech and language. No gross focal neurologic deficits are appreciated.  Skin:  Skin is warm and dry. No rash noted.  Psychiatric: Mood and affect are normal. Speech and behavior are normal.  ____________________________________________   LABS (all labs ordered are listed, but only abnormal results are displayed)  Labs Reviewed  COMPREHENSIVE METABOLIC PANEL - Abnormal; Notable for the following components:      Result Value   CO2 20 (*)    Glucose, Bld 126 (*)    Calcium 8.7 (*)    All other components within normal limits  URINALYSIS, COMPLETE (UACMP) WITH MICROSCOPIC - Abnormal; Notable for the following components:   Color, Urine YELLOW (*)    APPearance HAZY (*)    All other components within normal limits  LIPASE, BLOOD  CBC  POC URINE PREG, ED   ____________________________________________  EKG   ____________________________________________  RADIOLOGY  US pelvis: Pending  ____________________________________________   PROCEDURES  Procedure(s) performed: No  Procedures  Critical Care performed: No ____________________________________________   INITIAL IMPRESSION / ASSESSMENT AND PLAN / ED COURSE  Pertinent labs & imaging results that were available during my care of the patient were reviewed by me and considered in my medical decision making (see chart for details).  39 year old female with PMH as  noted above presents with acute onset of left suprapubic area pain earlier this morning associated with nausea but no vomiting.  The patient has no vaginal bleeding, discharge, or urinary symptoms.  She has a history of ovarian cyst in the past, and is status post hysterectomy but not oophorectomy.  On exam, she is overall well-appearing and her vital signs are normal.  She does have some suprapubic/left pelvic area tenderness.  Differential includes most likely ovarian cyst versus other benign etiology.  I have a low suspicion for torsion given that the pain has improved on its own, and there is no evidence of UTI or ureteral stone based on the urinalysis.  There is also no clinical evidence for PID especially given the lack of any high risk sexual history and the patient being status post hysterectomy.  Based on the location of the pain and the lack of GI symptoms, I do not suspect diverticulitis or colitis.  Basic labs are unremarkable.  We will obtain a pelvic ultrasound for further evaluation.  ----------------------------------------- 3:20 PM on 07/25/2019 -----------------------------------------  Ultrasound is pending.  I have signed the patient out to the oncoming physician Dr. Quentin Cornwall.   ____________________________________________   FINAL CLINICAL IMPRESSION(S) / ED DIAGNOSES  Final diagnoses:  Pelvic pain      NEW MEDICATIONS STARTED DURING THIS VISIT:  New Prescriptions   No medications on file     Note:  This document was prepared using Dragon voice recognition software and may include unintentional dictation errors.    Arta Silence, MD 07/25/19 1520

## 2019-07-25 NOTE — ED Triage Notes (Signed)
Pt states pain in left lower quadrant radiates to back

## 2019-07-25 NOTE — ED Provider Notes (Signed)
Patient received in sign-out from Dr. Cherylann Banas.  Workup and evaluation pending ultrasound imaging.  Ultrasound imaging does show evidence of ovarian cyst no evidence of torsion.  She was still having some pain.  I suspect primarily this is related to ovarian pain.  Discussed option for additional imaging which patient preferred to proceed with CT imaging to exclude stone.  CT imaging shows evidence of left ovarian cyst which would explain the pain.  At this point do believe she is stable and appropriate for outpatient follow-up.  Have discussed with the patient and available family all diagnostics and treatments performed thus far and all questions were answered to the best of my ability. The patient demonstrates understanding and agreement with plan.       Caitlyn Lot, MD 07/25/19 1755

## 2019-07-25 NOTE — ED Notes (Signed)
Water given to pt. Ok per EDP.

## 2019-07-25 NOTE — Discharge Instructions (Signed)

## 2019-07-28 ENCOUNTER — Encounter: Payer: Self-pay | Admitting: Obstetrics & Gynecology

## 2019-07-28 ENCOUNTER — Other Ambulatory Visit: Payer: Self-pay

## 2019-07-28 ENCOUNTER — Ambulatory Visit (INDEPENDENT_AMBULATORY_CARE_PROVIDER_SITE_OTHER): Payer: BC Managed Care – PPO | Admitting: Obstetrics & Gynecology

## 2019-07-28 VITALS — BP 100/60 | Ht 73.0 in | Wt 250.0 lb

## 2019-07-28 DIAGNOSIS — R102 Pelvic and perineal pain: Secondary | ICD-10-CM | POA: Diagnosis not present

## 2019-07-28 DIAGNOSIS — N83202 Unspecified ovarian cyst, left side: Secondary | ICD-10-CM

## 2019-07-28 MED ORDER — NORETHINDRONE 0.35 MG PO TABS: 1.0000 | ORAL_TABLET | Freq: Every day | ORAL | 11 refills | Status: DC

## 2019-07-28 NOTE — Patient Instructions (Signed)
Norethindrone tablets  What is this medicine? NORETHINDRONE (nor eth IN drone) is an oral contraceptive. The product contains a female hormone known as a progestin. It is used to prevent pregnancy as well as to treat hormone concerns such as ovarian cysts. This medicine may be used for other purposes; ask your health care provider or pharmacist if you have questions. COMMON BRAND NAME(S): Camila, Deblitane 28-Day, Errin, Heather, [place], Jolivette, [place], Nor-QD, Nora-BE, Norlyroc, Ortho Micronor, [employer] 28-Day What should I tell my health care provider before I take this medicine? They need to know if you have any of these conditions:  blood vessel disease or blood clots  breast, cervical, or vaginal cancer  diabetes  heart disease  kidney disease  liver disease  mental depression  migraine  seizures  stroke  vaginal bleeding  an unusual or allergic reaction to norethindrone, other medicines, foods, dyes, or preservatives  pregnant or trying to get pregnant  breast-feeding How should I use this medicine? Take this medicine by mouth with a glass of water. You may take it with or without food. Follow the directions on the prescription label. Take this medicine at the same time each day and in the order directed on the package. Do not take your medicine more often than directed. Contact your pediatrician regarding the use of this medicine in children. Special care may be needed. This medicine has been used in female children who have started having menstrual periods. A patient package insert for the product will be given with each prescription and refill. Read this sheet carefully each time. The sheet may change frequently. Overdosage: If you think you have taken too much of this medicine contact a poison control center or emergency room at once. NOTE: This medicine is only for you. Do not share this medicine with others. What if I miss a dose? Try not to miss a dose. Every  time you miss a dose or take a dose late your chance of pregnancy increases. When 1 pill is missed (even if only 3 hours late), take the missed pill as soon as possible and continue taking a pill each day at the regular time (use a back up method of birth control for the next 48 hours). If more than 1 dose is missed, use an additional birth control method for the rest of your pill pack until menses occurs. Contact your health care professional if more than 1 dose has been missed. What may interact with this medicine? Do not take this medicine with any of the following medications:  amprenavir or fosamprenavir  bosentan This medicine may also interact with the following medications:  antibiotics or medicines for infections, especially rifampin, rifabutin, rifapentine, and griseofulvin, and possibly penicillins or tetracyclines  aprepitant  barbiturate medicines, such as phenobarbital  carbamazepine  felbamate  modafinil  oxcarbazepine  phenytoin  ritonavir or other medicines for HIV infection or AIDS  St. John's wort  topiramate This list may not describe all possible interactions. Give your health care provider a list of all the medicines, herbs, non-prescription drugs, or dietary supplements you use. Also tell them if you smoke, drink alcohol, or use illegal drugs. Some items may interact with your medicine. What should I watch for while using this medicine? Visit your doctor or health care professional for regular checks on your progress. You will need a regular breast and pelvic exam and Pap smear while on this medicine. Use an additional method of birth control during the first cycle that you  take these tablets. If you have any reason to think you are pregnant, stop taking this medicine right away and contact your doctor or health care professional. If you are taking this medicine for hormone related problems, it may take several cycles of use to see improvement in your  condition. This medicine does not protect you against HIV infection (AIDS) or any other sexually transmitted diseases. What side effects may I notice from receiving this medicine? Side effects that you should report to your doctor or health care professional as soon as possible:  breast tenderness or discharge  pain in the abdomen, chest, groin or leg  severe headache  skin rash, itching, or hives  sudden shortness of breath  unusually weak or tired  vision or speech problems  yellowing of skin or eyes Side effects that usually do not require medical attention (report to your doctor or health care professional if they continue or are bothersome):  changes in sexual desire  change in menstrual flow  facial hair growth  fluid retention and swelling  headache  irritability  nausea  weight gain or loss This list may not describe all possible side effects. Call your doctor for medical advice about side effects. You may report side effects to FDA at 1-800-FDA-1088. Where should I keep my medicine? Keep out of the reach of children. Store at room temperature between 15 and 30 degrees C (59 and 86 degrees F). Throw away any unused medicine after the expiration date. NOTE: This sheet is a summary. It may not cover all possible information. If you have questions about this medicine, talk to your doctor, pharmacist, or health care provider.  2020 Elsevier/Gold Standard (2011-12-12 16:41:35)

## 2019-07-28 NOTE — Progress Notes (Signed)
HPI: Patient is a 39 y.o. [id number] who LMP was No LMP recorded. Patient has had a hysterectomy., presents today for a problem visit.  She complains of recent findings of Left ovarion cyst by Ultrasound - Pelvic Vaginal.  Pt has had symptoms of pain.  Pain is mostly lower quadrant, bilateral, no radiation, started severely on Monday but she has had pain like this regularly; no cycles due to prior hysterectomy; modifiers include rest and NSAIDs, no other associated sx's.  Previous evaluation: emergency room visit on 07/25/19.  Prior Diagnosis: left ovarian cyst 2.4 cm; she has prior h/o left ovarian cyst 2020, and surgery for right ovarian cyst 2019. Previous Treatment: NSAIDs.  PMHx: She  has a past medical history of Anxiety, Depression, GERD (gastroesophageal reflux disease), Hyperlipidemia, Migraine, and Seizures ([place]). Also,  has a past surgical history that includes Abdominal hysterectomy; LEEP; LEEP; Ectopic pregnancy surgery; endometirosis; Hand surgery; Ventral hernia repair (N/A, 03/19/2017); Cervical biopsy w/ loop electrode excision; Laparoscopic ovarian cystectomy (Left, 03/02/2018); Rectocele repair (N/A, 03/02/2018); and Perineoplasty (N/A, 03/02/2018)., family history includes Diabetes in her mother; Hypertension in her father and mother; Renal cancer in her father.,  reports that she quit smoking about 10 years ago. Her smoking use included cigarettes. She quit after 7.00 years of use. She has never used smokeless tobacco. She reports current alcohol use. She reports that she does not use drugs.  She has a current medication list which includes the following prescription(s): citalopram, topiramate, acetaminophen, bupropion, cyclobenzaprine, hydrocodone-acetaminophen, ibuprofen, ibuprofen, norethindrone, omeprazole, prednisone, sumatriptan, tizanidine, and tramadol. Also, has No Known Allergies.  Review of Systems  Constitutional: Positive for malaise/fatigue. Negative for chills and  fever.  HENT: Negative for congestion, sinus pain and sore throat.   Eyes: Negative for blurred vision and pain.  Respiratory: Negative for cough and wheezing.   Cardiovascular: Negative for chest pain and leg swelling.  Gastrointestinal: Positive for abdominal pain and nausea. Negative for constipation, diarrhea, heartburn and vomiting.  Genitourinary: Negative for dysuria, frequency, hematuria and urgency.  Musculoskeletal: Negative for back pain, joint pain, myalgias and neck pain.  Skin: Negative for itching and rash.  Neurological: Positive for headaches. Negative for dizziness, tremors and weakness.  Endo/Heme/Allergies: Does not bruise/bleed easily.  Psychiatric/Behavioral: Positive for depression. The patient is nervous/anxious. The patient does not have insomnia.     Objective: BP 100/60   Ht 6\' 1"  (1.854 m)   Wt 250 lb (113.4 kg)   BMI 32.98 kg/m  Physical Exam Constitutional:      General: She is not in acute distress.    Appearance: She is well-developed.  Genitourinary:     Pelvic exam was performed with patient supine.     Vagina normal.     No vaginal erythema or bleeding.     Genitourinary Comments: Cuff intact/ no lesions  Absent uterus and cervix  HENT:     Head: Normocephalic and atraumatic.     Nose: Nose normal.  Abdominal:     General: There is no distension.     Palpations: Abdomen is soft.     Tenderness: There is no abdominal tenderness.  Musculoskeletal:        General: Normal range of motion.  Neurological:     Mental Status: She is alert and oriented to person, place, and time.     Cranial Nerves: No cranial nerve deficit.  Skin:    General: Skin is warm and dry.  Psychiatric:        Attention and Perception:  Attention normal.        Mood and Affect: Mood normal.        Speech: Speech normal.        Behavior: Behavior normal.        Cognition and Memory: Cognition normal.        Judgment: Judgment normal.     ASSESSMENT/PLAN:     Problem List Items Addressed This Visit      Other   Pelvic pain   Relevant Orders   US PELVIC COMPLETE WITH TRANSVAGINAL    Other Visit Diagnoses    Left ovarian cyst    -  Primary   Relevant Orders   US PELVIC COMPLETE WITH TRANSVAGINAL    Progesterone daily for cyst suppression   Counseled as to low risks of this regimen    Info provided [place] 4 weeks to assess growth potential of this cyst NSAIDs for pain Option for oophorectomy discussed but not recommended  A total of 30 minutes were spent face-to-face with the patient as well as preparation, review, communication, and documentation during this encounter.   Barnett Applebaum, MD, [facility], [facility] 07/28/2019  9:57 AM

## 2019-08-22 ENCOUNTER — Telehealth: Payer: Self-pay | Admitting: Obstetrics & Gynecology

## 2019-08-22 ENCOUNTER — Other Ambulatory Visit: Payer: Self-pay | Admitting: Obstetrics & Gynecology

## 2019-08-22 ENCOUNTER — Ambulatory Visit (INDEPENDENT_AMBULATORY_CARE_PROVIDER_SITE_OTHER): Payer: BC Managed Care – PPO | Admitting: Obstetrics & Gynecology

## 2019-08-22 ENCOUNTER — Other Ambulatory Visit: Payer: Self-pay

## 2019-08-22 ENCOUNTER — Ambulatory Visit (INDEPENDENT_AMBULATORY_CARE_PROVIDER_SITE_OTHER): Payer: BC Managed Care – PPO

## 2019-08-22 ENCOUNTER — Encounter: Payer: Self-pay | Admitting: Obstetrics & Gynecology

## 2019-08-22 VITALS — BP 100/70 | Ht 73.0 in | Wt 249.0 lb

## 2019-08-22 DIAGNOSIS — N83202 Unspecified ovarian cyst, left side: Secondary | ICD-10-CM

## 2019-08-22 DIAGNOSIS — R102 Pelvic and perineal pain: Secondary | ICD-10-CM

## 2019-08-22 NOTE — Progress Notes (Signed)
HPI: Abdominal Pain Patient presents for evaluation of abdominal pain. The pain is described as sharp and shooting, and is 4/20 most days but also days where it is 8/10 in intensity. Pain is located in the LLQ area without radiation. Onset was ongoing occurring several weeks ago. Symptoms have been unchanged since. Aggravating factors: none. Alleviating factors: acetaminophen and recumbency. Associated symptoms: none. The patient denies chills, fever and nausea. Risk factors for pelvic/abdominal pain include prior hyst; prior left ovarian cyst seen 6 weeks ago by imaging and ER visit for pain.  Ultrasound demonstrates cyst seen 4 cm, hemorrhagic in appeacance, see below  PMHx: She  has a past medical history of Anxiety, Depression, GERD (gastroesophageal reflux disease), Hyperlipidemia, Migraine, and Seizures ([place]). Also,  has a past surgical history that includes Abdominal hysterectomy; LEEP; LEEP; Ectopic pregnancy surgery; endometirosis; Hand surgery; Ventral hernia repair (N/A, 03/19/2017); Cervical biopsy w/ loop electrode excision; Laparoscopic ovarian cystectomy (Left, 03/02/2018); Rectocele repair (N/A, 03/02/2018); and Perineoplasty (N/A, 03/02/2018)., family history includes Diabetes in her mother; Hypertension in her father and mother; Renal cancer in her father.,  reports that she quit smoking about 10 years ago. Her smoking use included cigarettes. She quit after 7.00 years of use. She has never used smokeless tobacco. She reports current alcohol use. She reports that she does not use drugs.  She has a current medication list which includes the following prescription(s): acetaminophen, citalopram, cyclobenzaprine, ibuprofen, ibuprofen, norethindrone, omeprazole, prednisone, sumatriptan, tizanidine, topiramate, bupropion, hydrocodone-acetaminophen, and tramadol. Also, has No Known Allergies.  Review of Systems  All other systems reviewed and are negative.   Objective: BP 100/70   Ht 6'  1" (1.854 m)   Wt 249 lb (112.9 kg)   BMI 32.85 kg/m   Physical examination Constitutional NAD, Conversant  Skin No rashes, lesions or ulceration.   Extremities: Moves all appropriately.  Normal ROM for age. No lymphadenopathy.  Neuro: Grossly intact  Psych: Oriented to PPT.  Normal mood. Normal affect.   US PELVIS TRANSVAGINAL NON-OB (TV ONLY)  Result Date: 08/22/2019 Patient Name: Caitlyn Harris DOB: 08/11/1980 MRN: [id number] ULTRASOUND REPORT Location: [facility] Date of Service: 08/22/2019 Indications:Pelvic Pain Findings: The uterus and cervix are absent. Right Ovary measures 2.3 x 2.2 x 1.6 cm. It is normal in appearance. There is a dominant follicle in the right ovary meauring 15.5 x 15.4 x 10.5 mm Left Ovary measures 5.2 x 5.4 x 3.8 cm. It is not normal in appearance. There is a complex cyst seen in the left ovary measuring 38.5 x 28.5 x 34.4 mm. This is most likely a hemorrhagic cyst. No blood flow is seen within the cyst. Survey of the adnexa demonstrates no adnexal masses. There is no free fluid in the cul de sac. Impression: 1. The uterus and cervix are absent. 2. Normal appearing right ovary. 3. There is a 3.9 cm complex left ovarian cyst. Recommendations: 1.Clinical correlation with the patient's History and Physical Exam. Gweneth Dimitri, RT Review of ULTRASOUND.    I have personally reviewed images and report of recent ultrasound done at [facility].    Plan of management to be discussed with patient. Barnett Applebaum, MD, [facility], [facility] 08/22/2019  9:12 AM   Assessment:  Pelvic pain  Left ovarian cyst  Options for continued expectant management vs surgery for ovarian cystectomy discussed.  Pros and cons of each.  As cyst has grown and she continues w pain, it is reasonable to proceed w Laparoscopy and  Ovarian Cystectomy, with preservation of ovary if possible.  This is what she requests and desires.  Consents obtained today, surgery May 25.  A  total of 20 minutes were spent face-to-face with the patient as well as preparation, review, communication, and documentation during this encounter.   Barnett Applebaum, MD, [facility], [facility] 08/22/2019  9:17 AM

## 2019-08-22 NOTE — Patient Instructions (Signed)
PRE ADMISSION TESTING For Covid, prior to procedure Friday 9:00-10:00 [facility] entrance (drive up)  Results in 48-72 hours You will not receive notification if test results are negative. If positive for Covid19, your provider will notify you by phone, with additional instructions.   Ovarian Cystectomy Ovarian cystectomy is a procedure that is done to remove a fluid-filled sac (cyst) on an ovary. The ovaries are small organs that produce eggs in women. Various types of cysts can form on the ovaries. Most are not cancerous. This procedure may be done for cysts that are large, cause symptoms, or do not go away on their own. It may also be done for a cyst that is cancerous or might be cancerous. This surgery can be done using a laparoscopic technique or an open abdominal technique. The laparoscopic technique is minimally invasive and results in smaller incisions and a faster recovery. The technique used will depend on your age, the type of cyst that you have, and whether the cyst is cancerous. The laparoscopic technique is not used for a cancerous cyst. Tell a health care provider about:  Any allergies you have.  All medicines you are taking, including vitamins, herbs, eye drops, creams, and over-the-counter medicines.  Any problems you or family members have had with anesthetic medicines.  Any blood disorders you have.  Any surgeries you have had.  Any medical conditions you have.  Whether you are pregnant or may be pregnant. What are the risks? Generally, this is a safe procedure. However, problems may occur, including:  Excessive bleeding.  Infection.  Damage to nearby structures or organs.  Allergic reactions to medicines.  Blood clots.  Inability to get pregnant (infertility). What happens before the procedure? Staying hydrated Follow instructions from your health care provider about hydration, which may include:  Up to 2 hours before the procedure - you  may continue to drink clear liquids, such as water, clear fruit juice, black coffee, and plain tea.  Eating and drinking restrictions Follow instructions from your health care provider about eating and drinking, which may include:  8 hours before the procedure - stop eating heavy meals or foods, such as meat, fried foods, or fatty foods.  6 hours before the procedure - stop eating light meals or foods, such as toast or cereal.  6 hours before the procedure - stop drinking milk or drinks that contain milk.  2 hours before the procedure - stop drinking clear liquids. Medicines Ask your health care provider about:  Changing or stopping your regular medicines. This is especially important if you are taking diabetes medicines or blood thinners.  Taking medicines such as aspirin and ibuprofen. These medicines can thin your blood. Do not take these medicines unless your health care provider tells you to take them.  Taking over-the-counter medicines, vitamins, herbs, and supplements. General instructions  Do not use any products that contain nicotine or tobacco for at least 4 weeks before the procedure. These products include cigarettes, e-cigarettes, and chewing tobacco. If you need help quitting, ask your health care provider.  Ask your health care provider: ? How your surgery site will be marked. ? What steps will be taken to help prevent infection. These may include:  Removing hair at the surgery site.  Washing skin with a germ-killing soap.  Taking antibiotic medicine.  You may be asked to shower with a germ-killing soap.  Plan to have someone take you home from the hospital or clinic.  Plan to have someone help with  household activities for 1-2 weeks after the procedure.  Let your health care provider know if you develop a cold or any infection before your surgery. What happens during the procedure?  An IV will be inserted into one of your veins.  You will be given one or  more of the following: ? A medicine to help you relax (sedative). ? A medicine to make you fall asleep (general anesthetic).  Small monitors will be attached to your body. They will be used to check your heart, blood pressure, and oxygen level.  A breathing tube will be placed into your lungs during the procedure.  Your surgeon will do the surgery using either the laparoscopic technique or the open abdominal technique. Laparoscopic technique   Several small incisions will be made in your abdomen.  Your abdomen will be filled with carbon dioxide gas to make it expand. This will give the surgeon more room to operate. It will also make your organs easier to see.  A thin scope with a camera (laparoscope) will be put through one of the small incisions. The laparoscope will send a picture to a monitor in the operating room to help the surgeon see inside your body.  Hollow tubes will be put through the other small incisions in your abdomen. The tools needed for the procedure will be put through these tubes.  The ovary with the cyst will be identified, and the cyst will be removed.  The tools will then be removed, and the incisions will be closed with stitches or skin glue. Bandages (dressings) may be applied. Open abdominal technique  A single, large incision will be made along your bikini line or in the middle of your lower abdomen.  The ovary with the cyst will be identified, and the cyst will be removed.  The incision will then be closed with stitches or staples.  Bandages (dressings) may be applied. The procedure may vary among health care providers and hospitals. What happens after the procedure?  Your blood pressure, heart rate, breathing rate, and blood oxygen level will be monitored until you leave the hospital or clinic.  Your IV will be removed after you are able to eat and drink well.  You may be given medicine for pain or to help you sleep.  You may be given an  antibiotic medicine.  Do not drive for 24 hours if you were given a sedative during the procedure.  The cyst that was removed will be sent to the lab for testing. If the cyst has cancer cells, both ovaries may need to be removed during a different surgery.  It is up to you to get the results of your procedure. Ask your health care provider, or the department that is doing the procedure, when your results will be ready. Summary  Ovarian cystectomy is a procedure that is done to remove a cyst on an ovary.  This procedure may be done for cysts that are large, cause symptoms, or do not go away on their own. It may also be done for a cyst that is cancerous or might be cancerous.  Follow instructions from your health care provider about eating and drinking before the procedure.  After the cyst is removed, it will be sent to the lab for testing. This information is not intended to replace advice given to you by your health care provider. Make sure you discuss any questions you have with your health care provider. Document Revised: 10/22/2018 Document Reviewed: 10/22/2018 Elsevier Patient Education  2020 Elsevier Inc.  

## 2019-08-22 NOTE — Telephone Encounter (Signed)
-----   Message from Gae Dry, MD sent at 08/22/2019  9:08 AM EDT ----- Regarding: SURGERY MAY 25 add on for pain/cyst Surgery Booking Request Patient Full Name:  Caitlyn Harris  MRN: [id number]  DOB: 14-Jul-1980  Surgeon: Hoyt Koch, MD  Requested Surgery Date and Time: MAY 25, 21 Primary Diagnosis AND Code:   ICD-10-CM  1. Pelvic pain  R10.2  2. Left ovarian cyst  N83.202  Secondary Diagnosis and Code:  Surgical Procedure: Laparoscopy with Ovarian Cystectomy L&D Notification: No Admission Status: same day surgery Length of Surgery: 30 min Special Case Needs: No H&P: No - DONE HERE TODAY Phone Interview???:  Yes Interpreter: No Language:  Medical Clearance:  No Special Scheduling Instructions: no Any known health/anesthesia issues, diabetes, sleep apnea, latex allergy, defibrillator/pacemaker?: No Acuity: P2   (P1 highest, P2 delay may cause harm, P3 low, elective gyn, P4 lowest)

## 2019-08-22 NOTE — H&P (View-Only) (Signed)
PRE-OPERATIVE HISTORY AND PHYSICAL EXAM  HPI:  Caitlyn Harris is a 39 y.o. [id number] No LMP recorded. Patient has had a hysterectomy.; she is being admitted for surgery related to adnexal mass.  She has left sided pain from a growing left ovarian cyst as followed with exams and ultrasounds.  PMHx: Past Medical History:  Diagnosis Date  . Anxiety   . Depression   . GERD (gastroesophageal reflux disease)    OCC  . Hyperlipidemia   . Migraine   . Seizures ([place])    FEBRILE AS A BABY   Past Surgical History:  Procedure Laterality Date  . ABDOMINAL HYSTERECTOMY    . CERVICAL BIOPSY  W/ LOOP ELECTRODE EXCISION    . ECTOPIC PREGNANCY SURGERY    . endometirosis    . HAND SURGERY     THUMB SURGERY  . LAPAROSCOPIC OVARIAN CYSTECTOMY Left 03/02/2018   Procedure: LAPAROSCOPIC RIGHT OVARIAN CYSTECTOMY;  Surgeon: Gae Dry, MD;  Location: [facility] ORS;  Service: Gynecology;  Laterality: Left;  . LEEP    . LEEP    . PERINEOPLASTY N/A 03/02/2018   Procedure: PERINEORRHAPHY;  Surgeon: Gae Dry, MD;  Location: [facility] ORS;  Service: Gynecology;  Laterality: N/A;  . RECTOCELE REPAIR N/A 03/02/2018   Procedure: POSTERIOR REPAIR (RECTOCELE);  Surgeon: Gae Dry, MD;  Location: [facility] ORS;  Service: Gynecology;  Laterality: N/A;  . VENTRAL HERNIA REPAIR N/A 03/19/2017   Procedure: HERNIA REPAIR VENTRAL ADULT;  Surgeon: Florene Glen, MD;  Location: [facility] ORS;  Service: General;  Laterality: N/A;   Family History  Problem Relation Age of Onset  . Hypertension Mother   . Diabetes Mother   . Renal cancer Father   . Hypertension Father    Social History   Tobacco Use  . Smoking status: Former Smoker    Years: 7.00    Types: Cigarettes    Quit date: 03/12/2009    Years since quitting: 10.4  . Smokeless tobacco: Never Used  . Tobacco comment: 1 PACK EVERY 2 WEEKS  Substance Use Topics  . Alcohol use: Yes    Comment: RARE  . Drug use: No    Current Outpatient  Medications:  .  acetaminophen (TYLENOL) 500 MG tablet, Take 1,000 mg by mouth every 6 (six) hours as needed for moderate pain or headache., Disp: , Rfl:  .  citalopram (CELEXA) 40 MG tablet, TAKE 1 TABLET BY MOUTH DAILY AT BEDTIME, Disp: 90 tablet, Rfl: 3 .  cyclobenzaprine (FLEXERIL) 10 MG tablet, Take 1 tablet (10 mg total) by mouth at bedtime., Disp: 15 tablet, Rfl: 0 .  ibuprofen (ADVIL) 800 MG tablet, Take 1 tablet (800 mg total) by mouth every 8 (eight) hours as needed., Disp: 30 tablet, Rfl: 1 .  ibuprofen (ADVIL) 800 MG tablet, Take 1 tablet (800 mg total) by mouth every 8 (eight) hours as needed., Disp: 21 tablet, Rfl: 0 .  norethindrone (MICRONOR) 0.35 MG tablet, Take 1 tablet (0.35 mg total) by mouth daily., Disp: 1 Package, Rfl: 11 .  omeprazole (PRILOSEC OTC) 20 MG tablet, Take 20 mg by mouth as needed., Disp: , Rfl:  .  predniSONE (DELTASONE) 10 MG tablet, Take 3 tabs on days 1-3, take 2 tabs on days 4-6, take 1 tab on days 7-9, Disp: 18 tablet, Rfl: 0 .  SUMAtriptan (IMITREX) 100 MG tablet, TAKE 1/2 TO 1 TABLET AT MIGRAINE ONSET. MAY REPEAT IN 2 HOURS X 1 IF NEEDED., Disp: 10 tablet, Rfl:  0 .  tiZANidine (ZANAFLEX) 4 MG capsule, Take 1 capsule (4 mg total) by mouth 3 (three) times daily as needed for muscle spasms., Disp: 15 capsule, Rfl: 0 .  topiramate (TOPAMAX) 100 MG tablet, TAKE 1 TABLET BY MOUTH TWICE A DAY, Disp: 180 tablet, Rfl: 1 .  buPROPion (WELLBUTRIN XL) 150 MG 24 hr tablet, TAKE 1 TABLET BY MOUTH EVERY DAY (Patient not taking: Reported on 08/22/2019), Disp: 90 tablet, Rfl: 1 .  HYDROcodone-acetaminophen (NORCO) 5-325 MG tablet, Take 1 tablet by mouth every 4 (four) hours as needed. (Patient not taking: Reported on 08/22/2019), Disp: 6 tablet, Rfl: 0 .  traMADol (ULTRAM) 50 MG tablet, Take 1 tablet (50 mg total) by mouth every 6 (six) hours as needed for severe pain. (Patient not taking: Reported on 08/22/2019), Disp: 20 tablet, Rfl: 0 Allergies: Patient has no known  allergies.  Review of Systems  Constitutional: Negative for chills, fever and malaise/fatigue.  HENT: Negative for congestion, sinus pain and sore throat.   Eyes: Negative for blurred vision and pain.  Respiratory: Negative for cough and wheezing.   Cardiovascular: Negative for chest pain and leg swelling.  Gastrointestinal: Positive for abdominal pain. Negative for constipation, diarrhea, heartburn, nausea and vomiting.  Genitourinary: Negative for dysuria, frequency, hematuria and urgency.  Musculoskeletal: Negative for back pain, joint pain, myalgias and neck pain.  Skin: Negative for itching and rash.  Neurological: Negative for dizziness, tremors and weakness.  Endo/Heme/Allergies: Does not bruise/bleed easily.  Psychiatric/Behavioral: Negative for depression. The patient is not nervous/anxious and does not have insomnia.     Objective: BP 100/70   Ht 6\' 1"  (1.854 m)   Wt 249 lb (112.9 kg)   BMI 32.85 kg/m   Filed Weights   08/22/19 0850  Weight: 249 lb (112.9 kg)   Physical Exam Constitutional:      General: She is not in acute distress.    Appearance: She is well-developed.  HENT:     Head: Normocephalic and atraumatic. No laceration.     Right Ear: Hearing normal.     Left Ear: Hearing normal.     Mouth/Throat:     Pharynx: Uvula midline.  Eyes:     Pupils: Pupils are equal, round, and reactive to light.  Neck:     Thyroid: No thyromegaly.  Cardiovascular:     Rate and Rhythm: Normal rate and regular rhythm.     Heart sounds: No murmur. No friction rub. No gallop.   Pulmonary:     Effort: Pulmonary effort is normal. No respiratory distress.     Breath sounds: Normal breath sounds. No wheezing.  Chest:     Breasts:        Right: No mass, skin change or tenderness.        Left: No mass, skin change or tenderness.  Abdominal:     General: Bowel sounds are normal. There is no distension.     Palpations: Abdomen is soft.     Tenderness: There is no abdominal  tenderness. There is no rebound.  Musculoskeletal:        General: Normal range of motion.     Cervical back: Normal range of motion and neck supple.  Neurological:     Mental Status: She is alert and oriented to person, place, and time.     Cranial Nerves: No cranial nerve deficit.  Skin:    General: Skin is warm and dry.  Psychiatric:        Judgment: Judgment normal.  Vitals reviewed.     Assessment: 1. Pelvic pain   2. Left ovarian cyst   Plan Laparoscopy Left Ovarian Cystectomy. Prior hysterectomy Discussed preservation of ovary She will cont w low dose progesterone for prevention of future cysts, its pros and cons also discussed  I have had a careful discussion with this patient about all the options available and the risk/benefits of each. I have fully informed this patient that surgery may subject her to a variety of discomforts and risks: She understands that most patients have surgery with little difficulty, but problems can happen ranging from minor to fatal. These include nausea, vomiting, pain, bleeding, infection, poor healing, hernia, or formation of adhesions. Unexpected reactions may occur from any drug or anesthetic given. Unintended injury may occur to other pelvic or abdominal structures such as Fallopian tubes, ovaries, bladder, ureter (tube from kidney to bladder), or bowel. Nerves going from the pelvis to the legs may be injured. Any such injury may require immediate or later additional surgery to correct the problem. Excessive blood loss requiring transfusion is very unlikely but possible. Dangerous blood clots may form in the legs or lungs. Physical and sexual activity will be restricted in varying degrees for an indeterminate period of time but most often 2-6 weeks.  Finally, she understands that it is impossible to list every possible undesirable effect and that the condition for which surgery is done is not always cured or significantly improved, and in rare cases  may be even worse.Ample time was given to answer all questions.  Barnett Applebaum, MD, [facility], [facility] 08/22/2019  9:21 AM

## 2019-08-22 NOTE — Telephone Encounter (Signed)
Spoke to pt while in office  Adv that Covid testing is 5/21 between 8-10:30, [address], drive up and wear a mask.  Adv pt to quar until [employer]  Will request pre-admit and call pt with appt  Adv that may receive calls from [place] and [facility]  Adv pt to call me if she has questions

## 2019-08-22 NOTE — Progress Notes (Signed)
PRE-OPERATIVE HISTORY AND PHYSICAL EXAM  HPI:  Caitlyn Harris is a 39 y.o. [id number] No LMP recorded. Patient has had a hysterectomy.; she is being admitted for surgery related to adnexal mass.  She has left sided pain from a growing left ovarian cyst as followed with exams and ultrasounds.  PMHx: Past Medical History:  Diagnosis Date  . Anxiety   . Depression   . GERD (gastroesophageal reflux disease)    OCC  . Hyperlipidemia   . Migraine   . Seizures ([place])    FEBRILE AS A BABY   Past Surgical History:  Procedure Laterality Date  . ABDOMINAL HYSTERECTOMY    . CERVICAL BIOPSY  W/ LOOP ELECTRODE EXCISION    . ECTOPIC PREGNANCY SURGERY    . endometirosis    . HAND SURGERY     THUMB SURGERY  . LAPAROSCOPIC OVARIAN CYSTECTOMY Left 03/02/2018   Procedure: LAPAROSCOPIC RIGHT OVARIAN CYSTECTOMY;  Surgeon: Gae Dry, MD;  Location: [facility] ORS;  Service: Gynecology;  Laterality: Left;  . LEEP    . LEEP    . PERINEOPLASTY N/A 03/02/2018   Procedure: PERINEORRHAPHY;  Surgeon: Gae Dry, MD;  Location: [facility] OR[facility];  Service: Gynecology;  Laterality: N/A;  . RECTOCELE REPAIR N/A 03/02/2018   Procedure: POSTERIOR REPAIR (RECTOCELE);  Surgeon: Gae Dry, MD;  Location: [facility];  Service: Gynecology;  Laterality: N/A;  . VENTRAL HERNIA REPAIR N/A 03/19/2017   Procedure: HERNIA REPAIR VENTRAL ADULT;  Surgeon: Florene Glen, MD;  Location: [facility] ORS;  Service: General;  Laterality: N/A;   Family History  Problem Relation Age of Onset  . Hypertension Mother   . Diabetes Mother   . Renal cancer Father   . Hypertension Father    Social History   Tobacco Use  . Smoking status: Former Smoker    Years: 7.00    Types: Cigarettes    Quit date: 03/12/2009    Years since quitting: 10.4  . Smokeless tobacco: Never Used  . Tobacco comment: 1 PACK EVERY 2 WEEKS  Substance Use Topics  . Alcohol use: Yes    Comment: RARE  . Drug use: No    Current Outpatient  Medications:  .  acetaminophen (TYLENOL) 500 MG tablet, Take 1,000 mg by mouth every 6 (six) hours as needed for moderate pain or headache., Disp: , Rfl:  .  citalopram (CELEXA) 40 MG tablet, TAKE 1 TABLET BY MOUTH DAILY AT BEDTIME, Disp: 90 tablet, Rfl: 3 .  cyclobenzaprine (FLEXERIL) 10 MG tablet, Take 1 tablet (10 mg total) by mouth at bedtime., Disp: 15 tablet, Rfl: 0 .  ibuprofen (ADVIL) 800 MG tablet, Take 1 tablet (800 mg total) by mouth every 8 (eight) hours as needed., Disp: 30 tablet, Rfl: 1 .  ibuprofen (ADVIL) 800 MG tablet, Take 1 tablet (800 mg total) by mouth every 8 (eight) hours as needed., Disp: 21 tablet, Rfl: 0 .  norethindrone (MICRONOR) 0.35 MG tablet, Take 1 tablet (0.35 mg total) by mouth daily., Disp: 1 Package, Rfl: 11 .  omeprazole (PRILOSEC OTC) 20 MG tablet, Take 20 mg by mouth as needed., Disp: , Rfl:  .  predniSONE (DELTASONE) 10 MG tablet, Take 3 tabs on days 1-3, take 2 tabs on days 4-6, take 1 tab on days 7-9, Disp: 18 tablet, Rfl: 0 .  SUMAtriptan (IMITREX) 100 MG tablet, TAKE 1/2 TO 1 TABLET AT MIGRAINE ONSET. MAY REPEAT IN 2 HOURS X 1 IF NEEDED., Disp: 10 tablet, Rfl:  0 .  tiZANidine (ZANAFLEX) 4 MG capsule, Take 1 capsule (4 mg total) by mouth 3 (three) times daily as needed for muscle spasms., Disp: 15 capsule, Rfl: 0 .  topiramate (TOPAMAX) 100 MG tablet, TAKE 1 TABLET BY MOUTH TWICE A DAY, Disp: 180 tablet, Rfl: 1 .  buPROPion (WELLBUTRIN XL) 150 MG 24 hr tablet, TAKE 1 TABLET BY MOUTH EVERY DAY (Patient not taking: Reported on 08/22/2019), Disp: 90 tablet, Rfl: 1 .  HYDROcodone-acetaminophen (NORCO) 5-325 MG tablet, Take 1 tablet by mouth every 4 (four) hours as needed. (Patient not taking: Reported on 08/22/2019), Disp: 6 tablet, Rfl: 0 .  traMADol (ULTRAM) 50 MG tablet, Take 1 tablet (50 mg total) by mouth every 6 (six) hours as needed for severe pain. (Patient not taking: Reported on 08/22/2019), Disp: 20 tablet, Rfl: 0 Allergies: Patient has no known  allergies.  Review of Systems  Constitutional: Negative for chills, fever and malaise/fatigue.  HENT: Negative for congestion, sinus pain and sore throat.   Eyes: Negative for blurred vision and pain.  Respiratory: Negative for cough and wheezing.   Cardiovascular: Negative for chest pain and leg swelling.  Gastrointestinal: Positive for abdominal pain. Negative for constipation, diarrhea, heartburn, nausea and vomiting.  Genitourinary: Negative for dysuria, frequency, hematuria and urgency.  Musculoskeletal: Negative for back pain, joint pain, myalgias and neck pain.  Skin: Negative for itching and rash.  Neurological: Negative for dizziness, tremors and weakness.  Endo/Heme/Allergies: Does not bruise/bleed easily.  Psychiatric/Behavioral: Negative for depression. The patient is not nervous/anxious and does not have insomnia.     Objective: BP 100/70   Ht 6\' 1"  (1.854 m)   Wt 249 lb (112.9 kg)   BMI 32.85 kg/m   Filed Weights   08/22/19 0850  Weight: 249 lb (112.9 kg)   Physical Exam Constitutional:      General: She is not in acute distress.    Appearance: She is well-developed.  HENT:     Head: Normocephalic and atraumatic. No laceration.     Right Ear: Hearing normal.     Left Ear: Hearing normal.     Mouth/Throat:     Pharynx: Uvula midline.  Eyes:     Pupils: Pupils are equal, round, and reactive to light.  Neck:     Thyroid: No thyromegaly.  Cardiovascular:     Rate and Rhythm: Normal rate and regular rhythm.     Heart sounds: No murmur. No friction rub. No gallop.   Pulmonary:     Effort: Pulmonary effort is normal. No respiratory distress.     Breath sounds: Normal breath sounds. No wheezing.  Chest:     Breasts:        Right: No mass, skin change or tenderness.        Left: No mass, skin change or tenderness.  Abdominal:     General: Bowel sounds are normal. There is no distension.     Palpations: Abdomen is soft.     Tenderness: There is no abdominal  tenderness. There is no rebound.  Musculoskeletal:        General: Normal range of motion.     Cervical back: Normal range of motion and neck supple.  Neurological:     Mental Status: She is alert and oriented to person, place, and time.     Cranial Nerves: No cranial nerve deficit.  Skin:    General: Skin is warm and dry.  Psychiatric:        Judgment: Judgment normal.  Vitals reviewed.     Assessment: 1. Pelvic pain   2. Left ovarian cyst   Plan Laparoscopy Left Ovarian Cystectomy. Prior hysterectomy Discussed preservation of ovary She will cont w low dose progesterone for prevention of future cysts, its pros and cons also discussed  I have had a careful discussion with this patient about all the options available and the risk/benefits of each. I have fully informed this patient that surgery may subject her to a variety of discomforts and risks: She understands that most patients have surgery with little difficulty, but problems can happen ranging from minor to fatal. These include nausea, vomiting, pain, bleeding, infection, poor healing, hernia, or formation of adhesions. Unexpected reactions may occur from any drug or anesthetic given. Unintended injury may occur to other pelvic or abdominal structures such as Fallopian tubes, ovaries, bladder, ureter (tube from kidney to bladder), or bowel. Nerves going from the pelvis to the legs may be injured. Any such injury may require immediate or later additional surgery to correct the problem. Excessive blood loss requiring transfusion is very unlikely but possible. Dangerous blood clots may form in the legs or lungs. Physical and sexual activity will be restricted in varying degrees for an indeterminate period of time but most often 2-6 weeks.  Finally, she understands that it is impossible to list every possible undesirable effect and that the condition for which surgery is done is not always cured or significantly improved, and in rare cases  may be even worse.Ample time was given to answer all questions.  Barnett Applebaum, MD, [facility], [facility] 08/22/2019  9:21 AM

## 2019-08-24 ENCOUNTER — Encounter
Admission: RE | Admit: 2019-08-24 | Discharge: 2019-08-24 | Disposition: A | Discharge status: Home / Self Care | Payer: BC Managed Care – PPO | Source: Ambulatory Visit | Attending: Obstetrics & Gynecology | Admitting: Obstetrics & Gynecology

## 2019-08-24 ENCOUNTER — Other Ambulatory Visit: Payer: Self-pay

## 2019-08-24 NOTE — Patient Instructions (Signed)
Your procedure is scheduled on: Tuesday Aug 30, 2019 Report to Day Surgery. To find out your arrival time please call [phone] between 1PM - 3PM on Monday Aug 29, 2019.  Remember: Instructions that are not followed completely may result in serious medical risk,  up to and including death, or upon the discretion of your surgeon and anesthesiologist your  surgery may need to be rescheduled.     _X__ 1. Do not eat food after midnight the night before your procedure.                 No gum chewing or hard candies. You may drink clear liquids up to 2 hours                 before you are scheduled to arrive for your surgery- DO not drink clear                 liquids within 2 hours of the start of your surgery.                 Clear Liquids include:  water, apple juice without pulp, clear Gatorade, G2 or                  Gatorade Zero (avoid Red/Purple/Blue), Black Coffee or Tea (Do not add                 anything to coffee or tea).  __X__2.  On the morning of surgery brush your teeth with toothpaste and water, you                may rinse your mouth with mouthwash if you wish.  Do not swallow any toothpaste of mouthwash.     _X__ 3.  No Alcohol for 24 hours before or after surgery.   _X__ 4.  Do Not Smoke or use e-cigarettes For 24 Hours Prior to Your Surgery.                 Do not use any chewable tobacco products for at least 6 hours prior to                 Surgery.  _X__  5.  Do not use any recreational drugs (marijuana, cocaine, heroin, ecstacy, MDMA or other)                For at least one week prior to your surgery.  Combination of these drugs with anesthesia                May have life threatening results.  __x__ 6.  Notify your doctor if there is any change in your medical condition      (cold, fever, infections).     Do not wear jewelry, make-up, hairpins, clips or nail polish. Do not wear lotions, powders, or perfumes. You may wear  deodorant. Do not shave 48 hours prior to surgery. Men may shave face and neck. Do not bring valuables to the hospital.    [facility] is not responsible for any belongings or valuables.  Contacts, dentures or bridgework may not be worn into surgery. Leave your suitcase in the car. After surgery it may be brought to your room. For patients admitted to the hospital, discharge time is determined by your treatment team.   Patients discharged the day of surgery will not be allowed to drive home.   Make arrangements for someone to be with you for the first  24 hours of your Same Day Discharge.    __x__ Take these medicines the morning of surgery with A SIP OF WATER:    1.omeprazole (PRILOSEC OTC)   2. topiramate (TOPAMAX) 100 MG  __x__ Use CHG Soap as directed  __x__ Stop Anti-inflammatories such as ibuprofen (ADVIL), Aleve, naproxen, aspirin and or BC powders.   __x__ Stop supplements until after surgery.    __x_ Do not start any herbal supplements before your surgery.

## 2019-08-26 ENCOUNTER — Other Ambulatory Visit
Admission: RE | Admit: 2019-08-26 | Discharge: 2019-08-26 | Disposition: A | Discharge status: Home / Self Care | Payer: BC Managed Care – PPO | Source: Ambulatory Visit | Attending: Obstetrics & Gynecology | Admitting: Obstetrics & Gynecology

## 2019-08-26 DIAGNOSIS — Z20822 Contact with and (suspected) exposure to covid-19: Secondary | ICD-10-CM | POA: Diagnosis not present

## 2019-08-26 DIAGNOSIS — Z01812 Encounter for preprocedural laboratory examination: Secondary | ICD-10-CM | POA: Diagnosis not present

## 2019-08-26 LAB — CBC
HCT: 39 % (ref 36.0–46.0)
Hemoglobin: 13.7 g/dL (ref 12.0–15.0)
MCH: 31.1 pg (ref 26.0–34.0)
MCHC: 35.1 g/dL (ref 30.0–36.0)
MCV: 88.6 fL (ref 80.0–100.0)
Platelets: 190 10*3/uL (ref 150–400)
RBC: 4.4 MIL/uL (ref 3.87–5.11)
RDW: 11.9 % (ref 11.5–15.5)
WBC: 5.8 10*3/uL (ref 4.0–10.5)
nRBC: 0 % (ref 0.0–0.2)

## 2019-08-26 LAB — TYPE AND SCREEN
ABO/RH(D): O NEG
Antibody Screen: NEGATIVE

## 2019-08-26 LAB — SARS CORONAVIRUS 2 (TAT 6-24 HRS): SARS Coronavirus 2: NEGATIVE

## 2019-08-29 ENCOUNTER — Encounter: Payer: Self-pay | Admitting: Obstetrics & Gynecology

## 2019-08-30 ENCOUNTER — Encounter
Admission: RE | Disposition: A | Discharge status: Home / Self Care | Payer: Self-pay | Source: Home / Self Care | Attending: Obstetrics & Gynecology

## 2019-08-30 ENCOUNTER — Ambulatory Visit: Payer: BC Managed Care – PPO | Admitting: Certified Registered Nurse Anesthetist

## 2019-08-30 ENCOUNTER — Other Ambulatory Visit: Payer: Self-pay

## 2019-08-30 ENCOUNTER — Ambulatory Visit
Admission: RE | Admit: 2019-08-30 | Discharge: 2019-08-30 | Disposition: A | Discharge status: Home / Self Care | Payer: BC Managed Care – PPO | Attending: Obstetrics & Gynecology | Admitting: Obstetrics & Gynecology

## 2019-08-30 ENCOUNTER — Encounter: Payer: Self-pay | Admitting: Obstetrics & Gynecology

## 2019-08-30 DIAGNOSIS — N83292 Other ovarian cyst, left side: Secondary | ICD-10-CM | POA: Insufficient documentation

## 2019-08-30 DIAGNOSIS — R102 Pelvic and perineal pain: Secondary | ICD-10-CM | POA: Diagnosis present

## 2019-08-30 DIAGNOSIS — G43909 Migraine, unspecified, not intractable, without status migrainosus: Secondary | ICD-10-CM | POA: Diagnosis not present

## 2019-08-30 DIAGNOSIS — K219 Gastro-esophageal reflux disease without esophagitis: Secondary | ICD-10-CM | POA: Diagnosis not present

## 2019-08-30 DIAGNOSIS — Z87891 Personal history of nicotine dependence: Secondary | ICD-10-CM | POA: Diagnosis not present

## 2019-08-30 DIAGNOSIS — Z79899 Other long term (current) drug therapy: Secondary | ICD-10-CM | POA: Insufficient documentation

## 2019-08-30 DIAGNOSIS — N83202 Unspecified ovarian cyst, left side: Secondary | ICD-10-CM | POA: Diagnosis present

## 2019-08-30 DIAGNOSIS — Z793 Long term (current) use of hormonal contraceptives: Secondary | ICD-10-CM | POA: Diagnosis not present

## 2019-08-30 DIAGNOSIS — F329 Major depressive disorder, single episode, unspecified: Secondary | ICD-10-CM | POA: Insufficient documentation

## 2019-08-30 HISTORY — PX: LAPAROSCOPIC OVARIAN CYSTECTOMY: SHX6248

## 2019-08-30 SURGERY — EXCISION, CYST, OVARY, LAPAROSCOPIC
Anesthesia: General | Laterality: Left

## 2019-08-30 MED ORDER — OXYCODONE-ACETAMINOPHEN 5-325 MG PO TABS: 1.0000 | ORAL_TABLET | ORAL | Status: DC | PRN

## 2019-08-30 MED ORDER — PHENYLEPHRINE HCL (PRESSORS) 10 MG/ML IV SOLN
INTRAVENOUS | Status: DC | PRN
  Administered 2019-08-30: 50 ug via INTRAVENOUS

## 2019-08-30 MED ORDER — MORPHINE SULFATE (PF) 2 MG/ML IV SOLN: 1.0000 mg | INTRAVENOUS | Status: DC | PRN

## 2019-08-30 MED ORDER — FENTANYL CITRATE (PF) 100 MCG/2ML IJ SOLN
INTRAMUSCULAR | Status: AC
  Administered 2019-08-30: 25 ug via INTRAVENOUS
  Filled 2019-08-30: qty 2

## 2019-08-30 MED ORDER — MEPERIDINE HCL 50 MG/ML IJ SOLN: 6.2500 mg | INTRAMUSCULAR | Status: DC | PRN

## 2019-08-30 MED ORDER — FENTANYL CITRATE (PF) 100 MCG/2ML IJ SOLN
INTRAMUSCULAR | Status: DC | PRN
  Administered 2019-08-30: 50 ug via INTRAVENOUS
  Administered 2019-08-30 (×2): 25 ug via INTRAVENOUS

## 2019-08-30 MED ORDER — LIDOCAINE HCL (CARDIAC) PF 100 MG/5ML IV SOSY
PREFILLED_SYRINGE | INTRAVENOUS | Status: DC | PRN
  Administered 2019-08-30: 100 mg via INTRAVENOUS

## 2019-08-30 MED ORDER — BUPIVACAINE HCL (PF) 0.5 % IJ SOLN
INTRAMUSCULAR | Status: DC | PRN
  Administered 2019-08-30: 10 mL

## 2019-08-30 MED ORDER — DEXMEDETOMIDINE HCL 200 MCG/2ML IV SOLN
INTRAVENOUS | Status: DC | PRN
  Administered 2019-08-30: 4 ug via INTRAVENOUS

## 2019-08-30 MED ORDER — LACTATED RINGERS IV SOLN: INTRAVENOUS | Status: DC

## 2019-08-30 MED ORDER — DEXAMETHASONE SODIUM PHOSPHATE 10 MG/ML IJ SOLN
INTRAMUSCULAR | Status: AC
  Filled 2019-08-30: qty 1

## 2019-08-30 MED ORDER — PROPOFOL 10 MG/ML IV BOLUS
INTRAVENOUS | Status: DC | PRN
  Administered 2019-08-30: 200 mg via INTRAVENOUS

## 2019-08-30 MED ORDER — DEXAMETHASONE SODIUM PHOSPHATE 10 MG/ML IJ SOLN
INTRAMUSCULAR | Status: DC | PRN
  Administered 2019-08-30: 10 mg via INTRAVENOUS

## 2019-08-30 MED ORDER — SUGAMMADEX SODIUM 500 MG/5ML IV SOLN
INTRAVENOUS | Status: DC | PRN
  Administered 2019-08-30: 230 mg via INTRAVENOUS

## 2019-08-30 MED ORDER — ROCURONIUM BROMIDE 100 MG/10ML IV SOLN
INTRAVENOUS | Status: DC | PRN
  Administered 2019-08-30: 50 mg via INTRAVENOUS

## 2019-08-30 MED ORDER — OXYCODONE HCL 5 MG PO TABS
ORAL_TABLET | ORAL | Status: AC
  Filled 2019-08-30: qty 1

## 2019-08-30 MED ORDER — PROPOFOL 10 MG/ML IV BOLUS
INTRAVENOUS | Status: AC
  Filled 2019-08-30: qty 20

## 2019-08-30 MED ORDER — MIDAZOLAM HCL 2 MG/2ML IJ SOLN
INTRAMUSCULAR | Status: DC | PRN
  Administered 2019-08-30: 2 mg via INTRAVENOUS

## 2019-08-30 MED ORDER — PROMETHAZINE HCL 25 MG/ML IJ SOLN: 6.2500 mg | INTRAMUSCULAR | Status: DC | PRN

## 2019-08-30 MED ORDER — GLYCOPYRROLATE 0.2 MG/ML IJ SOLN
INTRAMUSCULAR | Status: DC | PRN
  Administered 2019-08-30: .2 mg via INTRAVENOUS

## 2019-08-30 MED ORDER — ONDANSETRON HCL 4 MG/2ML IJ SOLN
INTRAMUSCULAR | Status: DC | PRN
  Administered 2019-08-30: 4 mg via INTRAVENOUS

## 2019-08-30 MED ORDER — ACETAMINOPHEN 650 MG RE SUPP
650.0000 mg | RECTAL | Status: DC | PRN
  Filled 2019-08-30: qty 1

## 2019-08-30 MED ORDER — OXYCODONE HCL 5 MG/5ML PO SOLN: 5.0000 mg | Freq: Once | ORAL | Status: AC | PRN

## 2019-08-30 MED ORDER — KETOROLAC TROMETHAMINE 30 MG/ML IJ SOLN
INTRAMUSCULAR | Status: DC | PRN
  Administered 2019-08-30: 30 mg via INTRAVENOUS

## 2019-08-30 MED ORDER — MIDAZOLAM HCL 2 MG/2ML IJ SOLN
INTRAMUSCULAR | Status: AC
  Filled 2019-08-30: qty 2

## 2019-08-30 MED ORDER — ACETAMINOPHEN 325 MG PO TABS: 650.0000 mg | ORAL_TABLET | ORAL | Status: DC | PRN

## 2019-08-30 MED ORDER — FENTANYL CITRATE (PF) 100 MCG/2ML IJ SOLN
25.0000 ug | INTRAMUSCULAR | Status: DC | PRN
  Administered 2019-08-30: 25 ug via INTRAVENOUS

## 2019-08-30 MED ORDER — OXYCODONE HCL 5 MG PO TABS
5.0000 mg | ORAL_TABLET | Freq: Once | ORAL | Status: AC | PRN
  Administered 2019-08-30: 5 mg via ORAL

## 2019-08-30 MED ORDER — OXYCODONE-ACETAMINOPHEN 5-325 MG PO TABS: 1.0000 | ORAL_TABLET | ORAL | 0 refills | Status: DC | PRN

## 2019-08-30 MED ORDER — FENTANYL CITRATE (PF) 100 MCG/2ML IJ SOLN
INTRAMUSCULAR | Status: AC
  Filled 2019-08-30: qty 2

## 2019-08-30 MED ORDER — ONDANSETRON HCL 4 MG/2ML IJ SOLN
INTRAMUSCULAR | Status: AC
  Filled 2019-08-30: qty 2

## 2019-08-30 SURGICAL SUPPLY — 39 items
BLADE SURG SZ11 CARB STEEL (BLADE) ×2 IMPLANT
CANISTER SUCT 1200ML W/VALVE (MISCELLANEOUS) ×2 IMPLANT
CATH ROBINSON RED A/P 16FR (CATHETERS) ×2 IMPLANT
CHLORAPREP W/TINT 26 (MISCELLANEOUS) ×2 IMPLANT
COVER WAND RF STERILE (DRAPES) IMPLANT
DERMABOND ADVANCED (GAUZE/BANDAGES/DRESSINGS) ×1
DERMABOND ADVANCED .7 DNX12 (GAUZE/BANDAGES/DRESSINGS) ×1 IMPLANT
DRSG TELFA 4X3 1S NADH ST (GAUZE/BANDAGES/DRESSINGS) IMPLANT
GLOVE BIO SURGEON STRL SZ8 (GLOVE) ×4 IMPLANT
GLOVE INDICATOR 8.0 STRL GRN (GLOVE) ×2 IMPLANT
GOWN STRL REUS W/ TWL LRG LVL3 (GOWN DISPOSABLE) ×1 IMPLANT
GOWN STRL REUS W/ TWL XL LVL3 (GOWN DISPOSABLE) ×1 IMPLANT
GOWN STRL REUS W/TWL LRG LVL3 (GOWN DISPOSABLE) ×1
GOWN STRL REUS W/TWL XL LVL3 (GOWN DISPOSABLE) ×1
GRASPER SUT TROCAR 14GX15 (MISCELLANEOUS) ×2 IMPLANT
IRRIGATION STRYKERFLOW (MISCELLANEOUS) IMPLANT
IRRIGATOR STRYKERFLOW (MISCELLANEOUS) ×2
IV LACTATED RINGERS 1000ML (IV SOLUTION) IMPLANT
KIT PINK PAD W/HEAD ARE REST (MISCELLANEOUS) ×2
KIT PINK PAD W/HEAD ARM REST (MISCELLANEOUS) ×1 IMPLANT
LABEL OR SOLS (LABEL) ×2 IMPLANT
NEEDLE VERESS 14GA 120MM (NEEDLE) ×2 IMPLANT
NS IRRIG 500ML POUR BTL (IV SOLUTION) ×2 IMPLANT
PACK GYN LAPAROSCOPIC (MISCELLANEOUS) ×2 IMPLANT
PAD PREP 24X41 OB/GYN DISP (PERSONAL CARE ITEMS) ×2 IMPLANT
POUCH SPECIMEN RETRIEVAL 10MM (ENDOMECHANICALS) IMPLANT
SCISSORS METZENBAUM CVD 33 (INSTRUMENTS) ×2 IMPLANT
SET TUBE SMOKE EVAC HIGH FLOW (TUBING) ×2 IMPLANT
SHEARS HARMONIC ACE PLUS 36CM (ENDOMECHANICALS) ×1 IMPLANT
SLEEVE ENDOPATH XCEL 5M (ENDOMECHANICALS) IMPLANT
SPONGE GAUZE 2X2 8PLY STRL LF (GAUZE/BANDAGES/DRESSINGS) IMPLANT
STRAP SAFETY 5IN WIDE (MISCELLANEOUS) ×2 IMPLANT
SUT VIC AB 0 CT1 36 (SUTURE) ×2 IMPLANT
SUT VIC AB 2-0 UR6 27 (SUTURE) IMPLANT
SUT VIC AB 4-0 PS2 18 (SUTURE) IMPLANT
SYR 10ML LL (SYRINGE) ×2 IMPLANT
SYSTEM WECK SHIELD CLOSURE (TROCAR) IMPLANT
TROCAR ENDO BLADELESS 11MM (ENDOMECHANICALS) IMPLANT
TROCAR XCEL NON-BLD 5MMX100MML (ENDOMECHANICALS) ×2 IMPLANT

## 2019-08-30 NOTE — Anesthesia Postprocedure Evaluation (Signed)
Anesthesia Post Note  Patient: Caitlyn Harris  Procedure(s) Performed: LAPAROSCOPIC OVARIAN CYSTECTOMY (Left )  Patient location during evaluation: PACU Anesthesia Type: General Level of consciousness: awake and alert and oriented Pain management: pain level controlled Vital Signs Assessment: post-procedure vital signs reviewed and stable Respiratory status: spontaneous breathing, nonlabored ventilation and respiratory function stable Cardiovascular status: blood pressure returned to baseline and stable Postop Assessment: no signs of nausea or vomiting Anesthetic complications: no     Last Vitals:  Vitals:   08/30/19 1131 08/30/19 1143  BP: 110/69 117/65  Pulse: 76 (!) 59  Resp: 14 16  Temp: (!) 36.3 C 36.4 C  SpO2: 96% 100%    Last Pain:  Vitals:   08/30/19 1143  TempSrc: Temporal  PainSc: 2                  Briah Nary

## 2019-08-30 NOTE — Interval H&P Note (Signed)
History and Physical Interval Note:  08/30/2019 9:12 AM  Caitlyn Harris  has presented today for surgery, with the diagnosis of Pelvic Pain R10.2 Left ovarian cyst N83.202.  The various methods of treatment have been discussed with the patient and family. After consideration of risks, benefits and other options for treatment, the patient has consented to  Procedure(s): LAPAROSCOPIC OVARIAN CYSTECTOMY (Left) as a surgical intervention.  The patient's history has been reviewed, patient examined, no change in status, stable for surgery.  I have reviewed the patient's chart and labs.  Questions were answered to the patient's satisfaction.     Hoyt Koch

## 2019-08-30 NOTE — Op Note (Signed)
  Operative Note   08/30/2019  PRE-OP DIAGNOSIS: Left Ovarian Cyst, Pelvic Pain   POST-OP DIAGNOSIS: same   PROCEDURE: Procedure(s): LAPAROSCOPIC OVARIAN CYSTECTOMY   SURGEON: Barnett Applebaum, MD, FACOG  ANESTHESIA: Choice   ESTIMATED BLOOD LOSS: 5 mL  COMPLICATIONS: None  DISPOSITION: PACU - hemodynamically stable.  CONDITION: stable  FINDINGS: Laparoscopic survey of the abdomen revealed a grossly normal liver edge, gallbladder edge and appendix, Minimal intra-abdominal adhesions were noted. Left Ovarian Cyst noted.  Right ovary normal.  PROCEDURE IN DETAIL: The patient was taken to the OR where anesthesia was administed. The patient was positioned in the supine position w foley catheter placed prior to prep and drape.  Attention was turned to the patient's abdomen where a 5 mm skin incision was made in the umbilical fold, after injection of local anesthesia. The Veress step needle was carefully introduced into the peritoneal cavity with placement confirmed using the hanging drop technique.  Pneumoperitoneum was obtained. The 5 mm port was then placed under direct visualization with the operative laparoscope.  Trendelenburg positioning.  Additional 58mm trocar was then placed in the RLQ lateral to the inferior epigastric blood vessels under direct visualization with the laparoscope.  Instrumentation to visualize complete pelvic anatomy performed.  A 51mm trocar was also then placed in the suprapubic region.  The ovarian cyst is identified and stabilized.  An incision is made with bloody fluid noted.  Fluid is aspirated.  Cyst wall is dissected free from the ovarian cortex and removed.  Hemostasis is visualized and assured.  Contralateral ovary seen as normal.  Pelvic cavity is cleaned with any fluid aspirated.  Instruments and trocars removed, gas expelled, and skin closed with skin adhesive glue.  Instrument, needle, and sponge counts correct x2 at the conclusion of the case.  Pt goes to  recovery room in stable condition.  Barnett Applebaum, MD, [facility], [facility] 08/30/2019  10:35 AM

## 2019-08-30 NOTE — Transfer of Care (Signed)
Immediate Anesthesia Transfer of Care Note  Patient: Caitlyn Harris  Procedure(s) Performed: LAPAROSCOPIC OVARIAN CYSTECTOMY (Left )  Patient Location: PACU  Anesthesia Type:General  Level of Consciousness: drowsy  Airway & Oxygen Therapy: Patient Spontanous Breathing and Patient connected to face mask oxygen  Post-op Assessment: Report given to RN and Post -op Vital signs reviewed and stable  Post vital signs: Reviewed and stable  Last Vitals:  Vitals Value Taken Time  BP 114/88 08/30/19 1038  Temp 36.4 C 08/30/19 1038  Pulse 72 08/30/19 1042  Resp 22 08/30/19 1042  SpO2 100 % 08/30/19 1042  Vitals shown include unvalidated device data.  Last Pain:  Vitals:   08/30/19 1038  TempSrc:   PainSc: Asleep         Complications: No apparent anesthesia complications

## 2019-08-30 NOTE — Discharge Instructions (Signed)
AMBULATORY SURGERY  DISCHARGE INSTRUCTIONS   1) The drugs that you were given will stay in your system until tomorrow so for the next 24 hours you should not:  A) Drive an automobile B) Make any legal decisions C) Drink any alcoholic beverage   2) You may resume regular meals tomorrow.  Today it is better to start with liquids and gradually work up to solid foods.  You may eat anything you prefer, but it is better to start with liquids, then soup and crackers, and gradually work up to solid foods.   3) Please notify your doctor immediately if you have any unusual bleeding, trouble breathing, redness and pain at the surgery site, drainage, fever, or pain not relieved by medication.    4) Additional Instructions:        Please contact your physician with any problems or Same Day Surgery at [phone], Monday through Friday 6 am to 4 pm, or [place] [facility] number at [phone].Ovarian Cystectomy, Care After This sheet gives you information about how to care for yourself after your procedure. Your health care provider may also give you more specific instructions. If you have problems or questions, contact your health care provider. What can I expect after the procedure? After the procedure, it is common to have:  Pain in the abdomen, especially at the incision areas. You will be given pain medicines to control the pain.  Tiredness. This is a normal part of the recovery process. Your energy level will return to normal over the next several weeks. Follow these instructions at home: Medicines  Take over-the-counter and prescription medicines only as told by your health care provider.  If you were prescribed an antibiotic medicine, use it as told by your health care provider. Do not stop using the antibiotic even if you start to feel better.  Do not take aspirin because it can cause bleeding.  Ask your health care provider if the medicine prescribed  to you: ? Requires you to avoid driving or using heavy machinery. ? Can cause constipation. You may need to take these actions to prevent or treat constipation:  Drink enough fluid to keep your urine pale yellow.  Take over-the-counter or prescription medicines.  Eat foods that are high in fiber, such as beans, whole grains, and fresh fruits and vegetables.  Limit foods that are high in fat and processed sugars, such as fried or sweet foods. Incision care   Follow instructions from your health care provider about how to take care of your incisions. Make sure you: ? Wash your hands with soap and water before and after you change your bandage (dressing). If soap and water are not available, use hand sanitizer. ? Change your dressing as told by your health care provider. ? Leave stitches (sutures), skin glue, or adhesive strips in place. These skin closures may need to stay in place for 2 weeks or longer. If adhesive strip edges start to loosen and curl up, you may trim the loose edges. Do not remove adhesive strips completely unless your health care provider tells you to do that.  Check your incision areas every day for signs of infection. Check for: ? Redness, swelling, or pain. ? Fluid or blood. ? Warmth. ? Pus or a bad smell.  Do not take baths, swim, or use a hot tub until your health care provider approves. Ask your health care provider if you may take showers. You may only be allowed  to take sponge baths. Activity  Rest as told by your health care provider.  Avoid sitting for a long time without moving. Get up to take short walks every 1-2 hours. This is important to improve blood flow and breathing. Ask for help if you feel weak or unsteady.  Do not lift anything that is heavier than 10 lb (4.5 kg), or the limit that you are told, until your health care provider says that it is safe.  Return to your normal activities and diet as told by your health care provider. Ask your  health care provider what activities are safe for you. General instructions  Do not douche, use tampons, or have sexual intercourse until your health care provider says it is okay to do so.  Do not use any products that contain nicotine or tobacco, such as cigarettes, e-cigarettes, and chewing tobacco. These can delay incision healing after surgery. If you need help quitting, ask your health care provider.  Keep all follow-up visits as told by your health care provider. This is important. Contact a health care provider if:  You have a fever.  You feel nauseous or you vomit.  You have pain when you urinate or have blood in your urine.  You have a rash on your body.  You have pain or redness where the IV was inserted.  You have pain that is not relieved with medicine.  You have any of these signs of infection: ? Redness, swelling, or pain around your incisions. ? Fluid or blood coming from your incisions. ? Warmth coming from an incision. ? Pus or a bad smell coming from your incisions. Get help right away if:  You have chest pain or shortness of breath.  You feel dizzy or light-headed.  You have heavy bleeding.  You have increasing abdominal pain that is not relieved with medicines.  You have pain, swelling, or redness in your leg.  Your incision is opening (the edges are not staying together). Summary  After the procedure, it is common to have some pain in your abdomen. You will be given pain medicines to control the pain.  Follow instructions from your health care provider about how to take care of your incisions.  Do not douche, use tampons, or have sexual intercourse until your health care provider says it is okay to do so.  Keep all follow-up visits as told by your health care provider. This is important. This information is not intended to replace advice given to you by your health care provider. Make sure you discuss any questions you have with your health care  provider. Document Revised: 10/21/2018 Document Reviewed: 10/21/2018 Elsevier Patient Education  [place].

## 2019-08-30 NOTE — Anesthesia Procedure Notes (Signed)
Procedure Name: Intubation Date/Time: 08/30/2019 9:37 AM Performed by: Jonna Clark, CRNA Pre-anesthesia Checklist: Patient identified, Patient being monitored, Timeout performed, Emergency Drugs available and Suction available Patient Re-evaluated:Patient Re-evaluated prior to induction Oxygen Delivery Method: Circle system utilized Preoxygenation: Pre-oxygenation with 100% oxygen Induction Type: IV induction Ventilation: Mask ventilation without difficulty Laryngoscope Size: 3 and McGraph Grade View: Grade I Tube type: Oral Tube size: 7.0 mm Number of attempts: 1 Airway Equipment and Method: Stylet Placement Confirmation: ETT inserted through vocal cords under direct vision,  positive ETCO2 and breath sounds checked- equal and bilateral Secured at: 21 cm Tube secured with: Tape Dental Injury: Teeth and Oropharynx as per pre-operative assessment

## 2019-08-30 NOTE — Anesthesia Preprocedure Evaluation (Signed)
Anesthesia Evaluation  Patient identified by MRN, date of birth, ID band Patient awake    Reviewed: Allergy & Precautions, NPO status , Patient's Chart, lab work & pertinent test results  History of Anesthesia Complications Negative for: history of anesthetic complications  Airway Mallampati: II  TM Distance: >3 FB Neck ROM: Full    Dental no notable dental hx.    Pulmonary neg sleep apnea, neg COPD, former smoker,    breath sounds clear to auscultation- rhonchi (-) wheezing      Cardiovascular (-) hypertension(-) CAD, (-) Past MI, (-) Cardiac Stents and (-) CABG  Rhythm:Regular Rate:Normal - Systolic murmurs and - Diastolic murmurs    Neuro/Psych  Headaches, neg Seizures PSYCHIATRIC DISORDERS Anxiety Depression    GI/Hepatic Neg liver ROS, GERD  ,  Endo/Other  negative endocrine ROSneg diabetes  Renal/GU negative Renal ROS     Musculoskeletal negative musculoskeletal ROS (+)   Abdominal (+) + obese,   Peds  Hematology negative hematology ROS (+)   Anesthesia Other Findings Past Medical History: No date: Anxiety No date: Depression No date: GERD (gastroesophageal reflux disease)     Comment:  OCC No date: Hyperlipidemia No date: Migraine No date: Seizures (HCC)     Comment:  FEBRILE AS A BABY   Reproductive/Obstetrics                             Anesthesia Physical Anesthesia Plan  ASA: II  Anesthesia Plan: General   Post-op Pain Management:    Induction: Intravenous  PONV Risk Score and Plan: 2 and Ondansetron, Dexamethasone and Midazolam  Airway Management Planned: Oral ETT  Additional Equipment:   Intra-op Plan:   Post-operative Plan: Extubation in OR  Informed Consent: I have reviewed the patients History and Physical, chart, labs and discussed the procedure including the risks, benefits and alternatives for the proposed anesthesia with the patient or authorized  representative who has indicated his/her understanding and acceptance.     Dental advisory given  Plan Discussed with: CRNA and Anesthesiologist  Anesthesia Plan Comments:         Anesthesia Quick Evaluation

## 2019-08-31 LAB — SURGICAL PATHOLOGY

## 2019-09-07 ENCOUNTER — Other Ambulatory Visit: Payer: Self-pay | Admitting: Family Medicine

## 2019-09-07 DIAGNOSIS — G43709 Chronic migraine without aura, not intractable, without status migrainosus: Secondary | ICD-10-CM

## 2019-09-07 NOTE — Telephone Encounter (Signed)
Last refilled 02/09/19  #180 x 1 refill Last OV (acute) 03/30/19  Please advise, thanks.

## 2019-11-29 ENCOUNTER — Telehealth: Payer: Self-pay | Admitting: Family Medicine

## 2019-11-29 DIAGNOSIS — G43709 Chronic migraine without aura, not intractable, without status migrainosus: Secondary | ICD-10-CM

## 2019-11-30 NOTE — Telephone Encounter (Signed)
Please try and schedule CPE with fasting labs prior with Debbie.

## 2019-12-02 NOTE — Telephone Encounter (Signed)
Labs 10/21 cpx 10/25 Pt aware

## 2019-12-09 DIAGNOSIS — Z20822 Contact with and (suspected) exposure to covid-19: Secondary | ICD-10-CM | POA: Diagnosis not present

## 2019-12-09 DIAGNOSIS — Z03818 Encounter for observation for suspected exposure to other biological agents ruled out: Secondary | ICD-10-CM | POA: Diagnosis not present

## 2019-12-09 DIAGNOSIS — R519 Headache, unspecified: Secondary | ICD-10-CM | POA: Diagnosis not present

## 2019-12-19 DIAGNOSIS — Z20822 Contact with and (suspected) exposure to covid-19: Secondary | ICD-10-CM | POA: Diagnosis not present

## 2019-12-19 DIAGNOSIS — Z03818 Encounter for observation for suspected exposure to other biological agents ruled out: Secondary | ICD-10-CM | POA: Diagnosis not present

## 2019-12-19 DIAGNOSIS — R05 Cough: Secondary | ICD-10-CM | POA: Diagnosis not present

## 2019-12-20 ENCOUNTER — Encounter: Payer: Self-pay | Admitting: Family Medicine

## 2019-12-20 ENCOUNTER — Telehealth (INDEPENDENT_AMBULATORY_CARE_PROVIDER_SITE_OTHER): Payer: BC Managed Care – PPO | Admitting: Family Medicine

## 2019-12-20 VITALS — Temp 100.4°F | Ht 73.0 in | Wt 250.0 lb

## 2019-12-20 DIAGNOSIS — Z20822 Contact with and (suspected) exposure to covid-19: Secondary | ICD-10-CM | POA: Insufficient documentation

## 2019-12-20 MED ORDER — GUAIFENESIN-CODEINE 100-10 MG/5ML PO SYRP: 5.0000 mL | ORAL_SOLUTION | Freq: Every evening | ORAL | 0 refills | Status: DC | PRN

## 2019-12-20 NOTE — Patient Instructions (Signed)
Rest, Fluids.  Ibuprofen 800 mg three times daily as need. Mucinex duroing the day for cough. Use prescription cough suppressant at night. Isolate until retested for COVID19 on 12/22/2019.  How to make an appointment for COVID testing.  TEXT: COVID to [address]269  or CALL: [phone] or  ONLINE: [email or web address]        Person Under Monitoring Name: Caitlyn Harris  Location: [place] [address]   Infection Prevention Recommendations for Individuals Confirmed to have, or Being Evaluated for, 2019 Novel Coronavirus (COVID-19) Infection Who Receive Care at Home  Individuals who are confirmed to have, or are being evaluated for, COVID-19 should follow the prevention steps below until a healthcare provider or local or state health department says they can return to normal activities.  Stay home except to get medical care You should restrict activities outside your home, except for getting medical care. Do not go to work, school, or public areas, and do not use public transportation or taxis.  Call ahead before visiting your doctor Before your medical appointment, call the healthcare provider and tell them that you have, or are being evaluated for, COVID-19 infection. This will help the healthcare provider's office take steps to keep other people from getting infected. Ask your healthcare provider to call the local or state health department.  Monitor your symptoms Seek prompt medical attention if your illness is worsening (e.g., difficulty breathing). Before going to your medical appointment, call the healthcare provider and tell them that you have, or are being evaluated for, COVID-19 infection. Ask your healthcare provider to call the local or state health department.  Wear a facemask You should wear a facemask that covers your nose and mouth when you are in the same room with other people and when you visit a healthcare provider. People who live with  or visit you should also wear a facemask while they are in the same room with you.  Separate yourself from other people in your home As much as possible, you should stay in a different room from other people in your home. Also, you should use a separate bathroom, if available.  Avoid sharing household items You should not share dishes, drinking glasses, cups, eating utensils, towels, bedding, or other items with other people in your home. After using these items, you should wash them thoroughly with soap and water.  Cover your coughs and sneezes Cover your mouth and nose with a tissue when you cough or sneeze, or you can cough or sneeze into your sleeve. Throw used tissues in a lined trash can, and immediately wash your hands with soap and water for at least 20 seconds or use an alcohol-based hand rub.  Wash your [facility] your hands often and thoroughly with soap and water for at least 20 seconds. You can use an alcohol-based hand sanitizer if soap and water are not available and if your hands are not visibly dirty. Avoid touching your eyes, nose, and mouth with unwashed hands.   Prevention Steps for Caregivers and Household Members of Individuals Confirmed to have, or Being Evaluated for, COVID-19 Infection Being Cared for in the Home  If you live with, or provide care at home for, a person confirmed to have, or being evaluated for, COVID-19 infection please follow these guidelines to prevent infection:  Follow healthcare provider's instructions Make sure that you understand and can help the patient follow any healthcare provider instructions for all care.  Provide for the patient's basic needs You should  help the patient with basic needs in the home and provide support for getting groceries, prescriptions, and other personal needs.  Monitor the patient's symptoms If they are getting sicker, call his or her medical provider and tell them that the patient has, or is being  evaluated for, COVID-19 infection. This will help the healthcare provider's office take steps to keep other people from getting infected. Ask the healthcare provider to call the local or state health department.  Limit the number of people who have contact with the patient  If possible, have only one caregiver for the patient.  Other household members should stay in another home or place of residence. If this is not possible, they should stay  in another room, or be separated from the patient as much as possible. Use a separate bathroom, if available.  Restrict visitors who do not have an essential need to be in the home.  Keep older adults, very young children, and other sick people away from the patient Keep older adults, very young children, and those who have compromised immune systems or chronic health conditions away from the patient. This includes people with chronic heart, lung, or kidney conditions, diabetes, and cancer.  Ensure good ventilation Make sure that shared spaces in the home have good air flow, such as from an air conditioner or an opened window, weather permitting.  Wash your hands often  Wash your hands often and thoroughly with soap and water for at least 20 seconds. You can use an alcohol based hand sanitizer if soap and water are not available and if your hands are not visibly dirty.  Avoid touching your eyes, nose, and mouth with unwashed hands.  Use disposable paper towels to dry your hands. If not available, use dedicated cloth towels and replace them when they become wet.  Wear a facemask and gloves  Wear a disposable facemask at all times in the room and gloves when you touch or have contact with the patient's blood, body fluids, and/or secretions or excretions, such as sweat, saliva, sputum, nasal mucus, vomit, urine, or feces.  Ensure the mask fits over your nose and mouth tightly, and do not touch it during use.  Throw out disposable facemasks and  gloves after using them. Do not reuse.  Wash your hands immediately after removing your facemask and gloves.  If your personal clothing becomes contaminated, carefully remove clothing and launder. Wash your hands after handling contaminated clothing.  Place all used disposable facemasks, gloves, and other waste in a lined container before disposing them with other household waste.  Remove gloves and wash your hands immediately after handling these items.  Do not share dishes, glasses, or other household items with the patient  Avoid sharing household items. You should not share dishes, drinking glasses, cups, eating utensils, towels, bedding, or other items with a patient who is confirmed to have, or being evaluated for, COVID-19 infection.  After the person uses these items, you should wash them thoroughly with soap and water.  Wash laundry thoroughly  Immediately remove and wash clothes or bedding that have blood, body fluids, and/or secretions or excretions, such as sweat, saliva, sputum, nasal mucus, vomit, urine, or feces, on them.  Wear gloves when handling laundry from the patient.  Read and follow directions on labels of laundry or clothing items and detergent. In general, wash and dry with the warmest temperatures recommended on the label.  Clean all areas the individual has used often  Clean all touchable surfaces,  such as counters, tabletops, doorknobs, bathroom fixtures, toilets, phones, keyboards, tablets, and bedside tables, every day. Also, clean any surfaces that may have blood, body fluids, and/or secretions or excretions on them.  Wear gloves when cleaning surfaces the patient has come in contact with.  Use a diluted bleach solution (e.g., dilute bleach with 1 part bleach and 10 parts water) or a household disinfectant with a label that says EPA-registered for coronaviruses. To make a bleach solution at home, add 1 tablespoon of bleach to 1 quart (4 cups) of water. For  a larger supply, add  cup of bleach to 1 gallon (16 cups) of water.  Read labels of cleaning products and follow recommendations provided on product labels. Labels contain instructions for safe and effective use of the cleaning product including precautions you should take when applying the product, such as wearing gloves or eye protection and making sure you have good ventilation during use of the product.  Remove gloves and wash hands immediately after cleaning.  Monitor yourself for signs and symptoms of illness Caregivers and household members are considered close contacts, should monitor their health, and will be asked to limit movement outside of the home to the extent possible. Follow the monitoring steps for close contacts listed on the symptom monitoring form.   ? If you have additional questions, contact your local health department or call the epidemiologist on call at [phone] (available 24/7). ? This guidance is subject to change. For the most up-to-date guidance from [facility], please refer to their website: [email or web address]

## 2019-12-20 NOTE — Assessment & Plan Note (Signed)
Likely COVID19  infection vs other viral infection. No clear sign of bacterial infection at this time.  Recommend RE-testing 5 days from last possible exposure .. info on how to obtain testing provided through [facility]. Mild SOB.  No red flags/need for ER visit or in-person exam at respiratory clinic at this time..    Pt low risk for COVID complications givenobesity. If SOB begins symptoms worsening.. have low threshold for in-person exam, if severe shortness of breath ER visit recommended.  Can monitor Oxygen saturation at home with home monitor if able to obtain.  Go to ER if O2 sat < 90% on room air.  Reviewed home care and provided information through [facility].  Recommended isolation until test returns. If returns positive 10 days quarantine recommended.  Provided info about prevention of spread of COVID 19.

## 2019-12-20 NOTE — Progress Notes (Signed)
Work note written as instructed by Dr. Bedsole. 

## 2019-12-20 NOTE — Progress Notes (Signed)
VIRTUAL VISIT Due to national recommendations of social distancing due to [place] 19, a virtual visit is felt to be most appropriate for this patient at this time.   I connected with the patient on 12/20/19 at  9:40 AM EDT by virtual telehealth platform and verified that I am speaking with the correct person using two identifiers.   I discussed the limitations, risks, security and privacy concerns of performing an evaluation and management service by  virtual telehealth platform and the availability of in person appointments. I also discussed with the patient that there may be a patient responsible charge related to this service. The patient expressed understanding and agreed to proceed.  Patient location: Home Provider Location: [facility] Participants: Eliezer Lofts and [name]   Chief Complaint  Patient presents with  . Cough  . Generalized Body Aches  . Fever    low grade  . Nasal Congestion  . Fatigue  . Headache    History of Present Illness:  39 year old female patient of Tor Netters presents with new onset fever, low grade, myalgias, congestion, fatigue and headache. Cough This is a new problem. The current episode started yesterday. The problem has been gradually worsening. The cough is non-productive. Associated symptoms include chills, a fever, headaches, myalgias, nasal congestion, a sore throat and shortness of breath. Pertinent negatives include no wheezing. Associated symptoms comments: 100.41F,  fatigued and weak feeling  mild SOB with exertion.. The symptoms are aggravated by lying down. Risk factors: nonsmoker. Treatments tried: ibuprofen. The treatment provided moderate relief. There is no history of asthma, bronchiectasis, bronchitis, COPD, emphysema, environmental allergies or pneumonia.  Fever  Associated symptoms include coughing, headaches and a sore throat. Pertinent negatives include no wheezing.  Headache  Associated symptoms include  coughing, a fever and a sore throat.   HX of ste.. 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 No history if chronic lung disease.   Rapid COVID test negative.  No COVID exposure known... [place]. She works as Warden/ranger.  She is S/P COVID vaccine series.   COVID 19 screen No recent travel or known exposure to [place]  The importance of social distancing was discussed today.   Review of Systems  Constitutional: Positive for chills and fever.  HENT: Positive for sore throat.   Respiratory: Positive for cough and shortness of breath. Negative for wheezing.   Musculoskeletal: Positive for myalgias.  Neurological: Positive for headaches.  Endo/Heme/Allergies: Negative for environmental allergies.      Past Medical History:  Diagnosis Date  . Anxiety   . Depression   . GERD (gastroesophageal reflux disease)    OCC  . Hyperlipidemia   . Migraine   . Seizures ([place])    FEBRILE AS A BABY    reports that she quit smoking about 10 years ago. Her smoking use included cigarettes. She quit after 7.00 years of use. She has never used smokeless tobacco. She reports current alcohol use. She reports that she does not use drugs.   Current Outpatient Medications:  .  acetaminophen (TYLENOL) 500 MG tablet, Take 1,000 mg by mouth every 6 (six) hours as needed for moderate pain or headache., Disp: , Rfl:  .  Cholecalciferol (VITAMIN D3 SUPER STRENGTH) 50 MCG (2000 UT) CAPS, Take 2,000 Units by mouth daily., Disp: , Rfl:  .  citalopram (CELEXA) 40 MG tablet, TAKE 1 TABLET BY MOUTH DAILY AT BEDTIME, Disp: 90 tablet, Rfl: 3 .  ELDERBERRY PO, Take 100  mg by mouth daily. Gummie, Disp: , Rfl:  .  omeprazole (PRILOSEC OTC) 20 MG tablet, Take 20 mg by mouth daily as needed (heartburn). , Disp: , Rfl:  .  SUMAtriptan (IMITREX) 100 MG tablet, TAKE 1/2 TO 1 TABLET AT MIGRAINE ONSET. MAY REPEAT IN 2 HOURS X 1 IF NEEDED., Disp: 10 tablet, Rfl: 0 .  tiZANidine (ZANAFLEX) 4 MG capsule, Take 1  capsule (4 mg total) by mouth 3 (three) times daily as needed for muscle spasms., Disp: 15 capsule, Rfl: 0 .  topiramate (TOPAMAX) 100 MG tablet, TAKE 1 TABLET BY MOUTH TWICE A DAY, Disp: 180 tablet, Rfl: 1   Observations/Objective: Temperature (!) 100.4 F (38 C), temperature source Tympanic, height 6\' 1"  (1.854 m), weight 250 lb (113.4 kg).  Physical Exam   Assessment and Plan   Suspected COVID-19 virus infection  Likely COVID19  infection vs other viral infection. No clear sign of bacterial infection at this time.  Recommend RE-testing 5 days from last possible exposure .. info on how to obtain testing provided through [place] [place]. Mild SOB.  No red flags/need for ER visit or in-person exam at respiratory clinic at this time..    Pt low risk for COVID complications givenobesity. If SOB begins symptoms worsening.. have low threshold for in-person exam, if severe shortness of breath ER visit recommended.  Can monitor Oxygen saturation at home with home monitor if able to obtain.  Go to ER if O2 sat < 90% on room air.  Reviewed home care and provided information through [place].  Recommended isolation until test returns. If returns positive 10 days quarantine recommended.  Provided info about prevention of spread of COVID 19.   I discussed the assessment and treatment plan with the patient. The patient was provided an opportunity to ask questions and all were answered. The patient agreed with the plan and demonstrated an understanding of the instructions.   The patient was advised to call back or seek an in-person evaluation if the symptoms worsen or if the condition fails to improve as anticipated.     Eliezer Lofts, MD

## 2019-12-22 ENCOUNTER — Other Ambulatory Visit: Payer: Self-pay | Admitting: Oncology

## 2019-12-22 ENCOUNTER — Other Ambulatory Visit: Payer: BC Managed Care – PPO

## 2019-12-22 ENCOUNTER — Encounter: Payer: Self-pay | Admitting: Oncology

## 2019-12-22 DIAGNOSIS — U071 COVID-19: Secondary | ICD-10-CM

## 2019-12-22 NOTE — Progress Notes (Signed)
I connected by phone with Caitlyn Harris to discuss the potential use of an new treatment for mild to moderate COVID-19 viral infection in non-hospitalized patients.   This patient is a age/sex that meets the FDA criteria for Emergency Use Authorization of casirivimab\imdevimab.  Has a (+) direct SARS-CoV-2 viral test result 1. Has mild or moderate COVID-19  2. Is ? 37 years of age and weighs ? 40 kg 3. Is NOT hospitalized due to COVID-19 4. Is NOT requiring oxygen therapy or requiring an increase in baseline oxygen flow rate due to COVID-19 5. Is within 10 days of symptom onset 6. Has at least one of the high risk factor(s) for progression to severe COVID-19 and/or hospitalization as defined in EUA. Specific high risk criteria :  Past Medical History:  Diagnosis Date  . Anxiety   . Depression   . GERD (gastroesophageal reflux disease)    OCC  . Hyperlipidemia   . Migraine   . Seizures ([place])    FEBRILE AS A BABY  ?  ?    Symptom onset 12/19/2019   I have spoken and communicated the following to the patient or parent/caregiver:   1. FDA has authorized the emergency use of casirivimab\imdevimab for the treatment of mild to moderate COVID-19 in adults and pediatric patients with positive results of direct SARS-CoV-2 viral testing who are 74 years of age and older weighing at least 40 kg, and who are at high risk for progressing to severe COVID-19 and/or hospitalization.   2. The significant known and potential risks and benefits of casirivimab\imdevimab, and the extent to which such potential risks and benefits are unknown.   3. Information on available alternative treatments and the risks and benefits of those alternatives, including clinical trials.   4. Patients treated with casirivimab\imdevimab should continue to self-isolate and use infection control measures (e.g., wear mask, isolate, social distance, avoid sharing personal items, clean and disinfect "high touch" surfaces, and  frequent handwashing) according to CDC guidelines.    5. The patient or parent/caregiver has the option to accept or refuse casirivimab\imdevimab .   After reviewing this information with the patient, The patient agreed to proceed with receiving casirivimab\imdevimab infusion and will be provided a copy of the Fact sheet prior to receiving the infusion.Caitlyn Harris, AGNP-C [phone] ([place])

## 2019-12-23 ENCOUNTER — Ambulatory Visit (HOSPITAL_COMMUNITY)
Admission: RE | Admit: 2019-12-23 | Discharge: 2019-12-23 | Disposition: A | Discharge status: Home / Self Care | Payer: BC Managed Care – PPO | Source: Ambulatory Visit | Attending: Pulmonary Disease | Admitting: Pulmonary Disease

## 2019-12-23 ENCOUNTER — Other Ambulatory Visit (HOSPITAL_COMMUNITY): Payer: Self-pay

## 2019-12-23 DIAGNOSIS — U071 COVID-19: Secondary | ICD-10-CM | POA: Diagnosis not present

## 2019-12-23 MED ORDER — SODIUM CHLORIDE 0.9 % IV SOLN: INTRAVENOUS | Status: DC | PRN

## 2019-12-23 MED ORDER — FAMOTIDINE IN NACL 20-0.9 MG/50ML-% IV SOLN: 20.0000 mg | Freq: Once | INTRAVENOUS | Status: DC | PRN

## 2019-12-23 MED ORDER — SODIUM CHLORIDE 0.9 % IV SOLN
1200.0000 mg | Freq: Once | INTRAVENOUS | Status: AC
  Administered 2019-12-23: 1200 mg via INTRAVENOUS

## 2019-12-23 MED ORDER — METHYLPREDNISOLONE SODIUM SUCC 125 MG IJ SOLR: 125.0000 mg | Freq: Once | INTRAMUSCULAR | Status: DC | PRN

## 2019-12-23 MED ORDER — DIPHENHYDRAMINE HCL 50 MG/ML IJ SOLN: 50.0000 mg | Freq: Once | INTRAMUSCULAR | Status: DC | PRN

## 2019-12-23 MED ORDER — ALBUTEROL SULFATE HFA 108 (90 BASE) MCG/ACT IN AERS: 2.0000 | INHALATION_SPRAY | Freq: Once | RESPIRATORY_TRACT | Status: DC | PRN

## 2019-12-23 MED ORDER — EPINEPHRINE 0.3 MG/0.3ML IJ SOAJ: 0.3000 mg | Freq: Once | INTRAMUSCULAR | Status: DC | PRN

## 2019-12-23 NOTE — Discharge Instructions (Signed)

## 2019-12-23 NOTE — Progress Notes (Signed)
  Diagnosis: COVID-19  Physician: Dr. Asencion Noble  Procedure: Covid Infusion Clinic Med: casirivimab\imdevimab infusion - Provided patient with casirivimab\imdevimab fact sheet for patients, parents and caregivers prior to infusion.  Complications: No immediate complications noted.  Discharge: Discharged home   Caitlyn Harris 12/23/2019

## 2019-12-29 ENCOUNTER — Encounter: Payer: Self-pay | Admitting: Family Medicine

## 2020-01-07 ENCOUNTER — Other Ambulatory Visit: Payer: Self-pay | Admitting: Family Medicine

## 2020-01-07 DIAGNOSIS — F330 Major depressive disorder, recurrent, mild: Secondary | ICD-10-CM

## 2020-01-22 ENCOUNTER — Other Ambulatory Visit: Payer: Self-pay | Admitting: Family Medicine

## 2020-01-22 DIAGNOSIS — E559 Vitamin D deficiency, unspecified: Secondary | ICD-10-CM

## 2020-01-22 DIAGNOSIS — E781 Pure hyperglyceridemia: Secondary | ICD-10-CM

## 2020-01-22 DIAGNOSIS — E6609 Other obesity due to excess calories: Secondary | ICD-10-CM

## 2020-01-26 ENCOUNTER — Other Ambulatory Visit: Payer: BC Managed Care – PPO

## 2020-01-30 ENCOUNTER — Encounter: Payer: BC Managed Care – PPO | Admitting: Family Medicine

## 2020-02-14 ENCOUNTER — Telehealth (INDEPENDENT_AMBULATORY_CARE_PROVIDER_SITE_OTHER): Payer: BC Managed Care – PPO | Admitting: Family Medicine

## 2020-02-14 ENCOUNTER — Encounter: Payer: Self-pay | Admitting: Family Medicine

## 2020-02-14 DIAGNOSIS — J0100 Acute maxillary sinusitis, unspecified: Secondary | ICD-10-CM | POA: Diagnosis not present

## 2020-02-14 DIAGNOSIS — J019 Acute sinusitis, unspecified: Secondary | ICD-10-CM | POA: Insufficient documentation

## 2020-02-14 MED ORDER — PREDNISONE 10 MG PO TABS: ORAL_TABLET | ORAL | 0 refills | Status: DC

## 2020-02-14 MED ORDER — AZITHROMYCIN 250 MG PO TABS: ORAL_TABLET | ORAL | 0 refills | Status: DC

## 2020-02-14 MED ORDER — ALBUTEROL SULFATE HFA 108 (90 BASE) MCG/ACT IN AERS: 2.0000 | INHALATION_SPRAY | RESPIRATORY_TRACT | 0 refills | Status: DC | PRN

## 2020-02-14 MED ORDER — BENZONATATE 200 MG PO CAPS: 200.0000 mg | ORAL_CAPSULE | Freq: Three times a day (TID) | ORAL | 1 refills | Status: DC | PRN

## 2020-02-14 NOTE — Patient Instructions (Addendum)
Drink fluids and rest  Take prednisone for cough/chest tightness/wheeze and congestion (it may make you feel hyper and hungry)  Take zithromax for sinus infection  Tessalon for cough  Albuterol inhaler for cough   Update if not starting to improve in a week or if worsening   How to Use a Metered Dose Inhaler A metered dose inhaler is a handheld device for taking medicine that must be breathed into the lungs (inhaled). The device can be used to deliver a variety of inhaled medicines, including:  Quick relief or rescue medicines, such as bronchodilators.  Controller medicines, such as corticosteroids. The medicine is delivered by pushing down on a metal canister to release a preset amount of spray and medicine. Each device contains the amount of medicine that is needed for a preset number of uses (inhalations). Your health care provider may recommend that you use a spacer with your inhaler to help you take the medicine more effectively. A spacer is a plastic tube with a mouthpiece on one end and an opening that connects to the inhaler on the other end. A spacer holds the medicine in a tube for a short time, which allows you to inhale more medicine. What are the risks? If you do not use your inhaler correctly, medicine might not reach your lungs to help you breathe. Inhaler medicine can cause side effects, such as:  Mouth or throat infection.  Cough.  Hoarseness.  Headache.  Nausea and vomiting.  Lung infection (pneumonia) in people who have a lung condition called COPD. How to use a metered dose inhaler without a spacer  1. Remove the cap from the inhaler. 2. If you are using the inhaler for the first time, shake it for 5 seconds, turn it away from your face, then release 4 puffs into the air. This is called priming. 3. Shake the inhaler for 5 seconds. 4. Position the inhaler so the top of the canister faces up. 5. Put your index finger on the top of the medicine canister. Support  the bottom of the inhaler with your thumb. 6. Breathe out normally and as completely as possible, away from the inhaler. 7. Either place the inhaler between your teeth and close your lips tightly around the mouthpiece, or hold the inhaler 1-2 inches (2.5-5 cm) away from your open mouth. Keep your tongue down out of the way. If you are unsure which technique to use, ask your health care provider. 8. Press the canister down with your index finger to release the medicine, then inhale deeply and slowly through your mouth (not your nose) until your lungs are completely filled. Inhaling should take 4-6 seconds. 9. Hold the medicine in your lungs for 5-10 seconds (10 seconds is best). This helps the medicine get into the small airways of your lungs. 10. With your lips in a tight circle (pursed), breathe out slowly. 11. Repeat steps 3-10 until you have taken the number of puffs that your health care provider directed. Wait about 1 minute between puffs or as directed. 12. Put the cap on the inhaler. 13. If you are using a steroid inhaler, rinse your mouth with water, gargle, and spit out the water. Do not swallow the water. How to use a metered dose inhaler with a spacer  1. Remove the cap from the inhaler. 2. If you are using the inhaler for the first time, shake it for 5 seconds, turn it away from your face, then release 4 puffs into the air. This is  called priming. 3. Shake the inhaler for 5 seconds. 4. Place the open end of the spacer onto the inhaler mouthpiece. 5. Position the inhaler so the top of the canister faces up and the spacer mouthpiece faces you. 6. Put your index finger on the top of the medicine canister. Support the bottom of the inhaler and the spacer with your thumb. 7. Breathe out normally and as completely as possible, away from the spacer. 8. Place the spacer between your teeth and close your lips tightly around it. Keep your tongue down out of the way. 9. Press the canister down  with your index finger to release the medicine, then inhale deeply and slowly through your mouth (not your nose) until your lungs are completely filled. Inhaling should take 4-6 seconds. 10. Hold the medicine in your lungs for 5-10 seconds (10 seconds is best). This helps the medicine get into the small airways of your lungs. 11. With your lips in a tight circle (pursed), breathe out slowly. 12. Repeat steps 3-11 until you have taken the number of puffs that your health care provider directed. Wait about 1 minute between puffs or as directed. 13. Remove the spacer from the inhaler and put the cap on the inhaler. 14. If you are using a steroid inhaler, rinse your mouth with water, gargle, and spit out the water. Do not swallow the water. Follow these instructions at home:  Take your inhaled medicine only as told by your health care provider. Do not use the inhaler more than directed by your health care provider.  Keep all follow-up visits as told by your health care provider. This is important.  If your inhaler has a counter, you can check it to determine how full your inhaler is. If your inhaler does not have a counter, ask your health care provider when you will need to refill your inhaler and write the refill date on a calendar or on your inhaler canister. Note that you cannot know when an inhaler is empty by shaking it.  Follow directions on the package insert for care and cleaning of your inhaler and spacer. Contact a health care provider if:  Symptoms are only partially relieved with your inhaler.  You are having trouble using your inhaler.  You have an increase in phlegm.  You have headaches. Get help right away if:  You feel little or no relief after using your inhaler.  You have dizziness.  You have a fast heart rate.  You have chills or a fever.  You have night sweats.  There is blood in your phlegm. Summary  A metered dose inhaler is a handheld device for taking  medicine that must be breathed into the lungs (inhaled).  The medicine is delivered by pushing down on a metal canister to release a preset amount of spray and medicine.  Each device contains the amount of medicine that is needed for a preset number of uses (inhalations). This information is not intended to replace advice given to you by your health care provider. Make sure you discuss any questions you have with your health care provider. Document Revised: 03/06/2017 Document Reviewed: 02/12/2016 Elsevier Patient Education  2020 [employer].

## 2020-02-14 NOTE — Assessment & Plan Note (Signed)
With viral uri/bronchitis (covid neg) and no fever  tx with zpack and 30 mg pred taper (disc side eff)  Albuterol mdi for wheeze  Tessalon for cough  Disc use of expectorant dm otc for cough Rest/fluids Update if not starting to improve in a week or if worsening

## 2020-02-14 NOTE — Progress Notes (Signed)
Virtual Visit via Video Note  I connected with [name] on 02/14/20 at 10:00 AM EST by a video enabled telemedicine application and verified that I am speaking with the correct person using two identifiers.  Location: Patient: home Provider: office   I discussed the limitations of evaluation and management by telemedicine and the availability of in person appointments. The patient expressed understanding and agreed to proceed.  Parties involved in encounter  Patient: Caitlyn Harris  Provider:  Loura Pardon MD   History of Present Illness: 39 yo pf of NP Caitlyn Harris presents with uri symptoms   Symptoms started on Saturday  Friday had diarrhea from something she ate Sat am -bad st /hurt to swallow  Then drainage and cough  Then pressure in her sinuses /bad congestion -below eyes  She is very prone to bacterial sinusitis  Ears hurt also   Now back/lungs hurt to cough  Not sleeping well at night- nose is running and she coughs  Prod cough- phlegm is yellow /brown  Nasal mucous - dark yellow   Some chest tightness  Some wheeze at end of insp/exp  No fever that she knows of  Cold natured but no chills  No body aches   Taste/smell are reduced (was back to normal almost from covid before that)   She took a rapid test for covid this am -it was neg    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 Acute sinusitis 02/14/2020  . Suspected COVID-19 virus infection 12/20/2019  . Left ovarian cyst 08/30/2019  . Hypertriglyceridemia 06/11/2018  . Candida vaginitis 04/09/2018  . Endometriosis 02/01/2018  . Bilateral ovarian cysts 02/01/2018  . Chronic  female pelvic pain 02/01/2018  . Pelvic pain 01/18/2018  . History of endometriosis 01/18/2018  . Rectocele 01/18/2018  . Insomnia 08/25/2017  . Cystocele, midline 08/25/2017  . Umbilical hernia without obstruction and without gangrene   . Chronic migraine without aura without status migrainosus, not intractable 02/04/2016  . Depression 02/04/2016  . Generalized anxiety disorder 02/04/2016  . Vitamin D deficiency, unspecified 02/04/2016   Past Medical History:  Diagnosis Date  . Anxiety   . Depression   . GERD (gastroesophageal reflux disease)    OCC  . Hyperlipidemia   . Migraine   . Seizures ([place])    FEBRILE AS A BABY   Past Surgical History:  Procedure Laterality Date  . ABDOMINAL HYSTERECTOMY    . CERVICAL BIOPSY  W/ LOOP ELECTRODE EXCISION    . ECTOPIC PREGNANCY SURGERY    . endometirosis    . HAND SURGERY     THUMB SURGERY  . LAPAROSCOPIC OVARIAN CYSTECTOMY Left 03/02/2018   Procedure: LAPAROSCOPIC RIGHT OVARIAN CYSTECTOMY;  Surgeon: Gae Dry, MD;  Location: [facility];  Service: Gynecology;  Laterality: Left;  . LAPAROSCOPIC OVARIAN CYSTECTOMY Left 08/30/2019   Procedure: LAPAROSCOPIC OVARIAN CYSTECTOMY;  Surgeon: Gae Dry, MD;  Location: [facility];  Service: Gynecology;  Laterality: Left;  . LEEP    . LEEP    . PERINEOPLASTY N/A 03/02/2018   Procedure: PERINEORRHAPHY;  Surgeon: Gae Dry, MD;  Location: [facility];  Service: Gynecology;  Laterality: N/A;  . RECTOCELE REPAIR N/A 03/02/2018   Procedure: POSTERIOR REPAIR (RECTOCELE);  Surgeon: Gae Dry, MD;  Location: [facility];  Service: Gynecology;  Laterality: N/A;  . VENTRAL HERNIA REPAIR N/A 03/19/2017   Procedure: HERNIA REPAIR VENTRAL ADULT;  Surgeon: Florene Glen, MD;  Location: [facility];  Service: General;  Laterality: N/A;   Social History   Tobacco Use  . Smoking status: Former Smoker    Years: 7.00    Types: Cigarettes    Quit date: 03/12/2009    Years since quitting:  10.9  . Smokeless tobacco: Never Used  . Tobacco comment: 1 PACK EVERY 2 WEEKS  Vaping Use  . Vaping Use: Never used  Substance Use Topics  . Alcohol use: Yes    Comment: RARE  . Drug use: No   Family History  Problem Relation Age of Onset  . Hypertension Mother   . Diabetes Mother   . Renal cancer Father   . Hypertension Father    Allergies  Allergen Reactions  . Tape     Low sensitivity to tape, ok with Tegaderm and paper tape    Current Outpatient Medications on File Prior to Visit  Medication Sig Dispense Refill  . acetaminophen (TYLENOL) 500 MG tablet Take 1,000 mg by mouth every 6 (six) hours as needed for moderate pain or headache.    . Cholecalciferol (VITAMIN D3 SUPER STRENGTH) 50 MCG (2000 UT) CAPS Take 2,000 Units by mouth daily.    . citalopram (CELEXA) 40 MG tablet TAKE 1 TABLET BY MOUTH DAILY AT BEDTIME 90 tablet 0  . ELDERBERRY PO Take 100 mg by mouth daily. Gummie    . INCASSIA 0.35 MG tablet Take 1 tablet by mouth at bedtime.    Marland Kitchen omeprazole (PRILOSEC OTC) 20 MG tablet Take 20 mg by mouth daily as needed (heartburn).     . SUMAtriptan (IMITREX) 100 MG tablet TAKE 1/2 TO 1 TABLET AT MIGRAINE ONSET. MAY REPEAT IN 2 HOURS X 1 IF NEEDED. 10 tablet 0  . tiZANidine (ZANAFLEX) 4 MG capsule Take 1 capsule (4 mg total) by mouth 3 (three) times daily as needed for muscle spasms. 15 capsule 0  . topiramate (TOPAMAX) 100 MG tablet TAKE 1 TABLET BY MOUTH TWICE A DAY 180 tablet 1   No current facility-administered medications on file prior to visit.   Review of Systems  Constitutional: Positive for malaise/fatigue. Negative for chills, diaphoresis and fever.  HENT: Positive for congestion, ear pain and sinus pain. Negative for sore throat.   Eyes: Negative for blurred vision, discharge and redness.  Respiratory: Positive for cough, sputum production and wheezing. Negative for hemoptysis, shortness of breath and stridor.   Cardiovascular: Negative for chest pain,  palpitations and leg swelling.  Gastrointestinal: Negative for abdominal pain, diarrhea, nausea and vomiting.  Musculoskeletal: Negative for myalgias.  Skin: Negative for rash.  Neurological: Positive for headaches. Negative for dizziness.     Observations/Objective: Patient appears well, in no distress Weight is baseline  No facial swelling or asymmetry Voice is mildly hoarse and congested sounding  No obvious tremor or mobility impairment Moving neck and UEs normally Able to hear the call well  Wet sounding cough noted   No sob or wheezing audible during visit  Talkative and mentally sharp with no cognitive changes No skin changes on face or neck , no rash or pallor Affect is normal  Assessment and Plan: Problem List Items Addressed This Visit      Respiratory   Acute sinusitis    With viral uri/bronchitis (covid neg) and no fever  tx with zpack and 30 mg pred taper (disc side eff)  Albuterol mdi for wheeze  Tessalon for cough  Disc use of expectorant dm otc for cough Rest/fluids Update if not starting to improve in a week or if worsening        Relevant Medications   predniSONE (DELTASONE) 10 MG tablet   azithromycin (ZITHROMAX Z-PAK) 250 MG tablet   benzonatate (TESSALON) 200 MG capsule       Follow Up Instructions: Drink fluids and rest  Take prednisone for cough/chest tightness/wheeze and congestion (it may make you feel hyper and hungry)  Take zithromax for sinus infection  Tessalon for cough  Albuterol inhaler for cough   Update if not starting to improve in a week or if worsening     I discussed the assessment and treatment plan with the patient. The patient was provided an opportunity to ask questions and all were answered. The patient agreed with the plan and demonstrated an understanding of the instructions.   The patient was advised to call back or seek an in-person evaluation if the symptoms worsen or if the condition fails to improve as  anticipated.     Loura Pardon, MD

## 2020-02-29 ENCOUNTER — Other Ambulatory Visit: Payer: Self-pay | Admitting: Family Medicine

## 2020-02-29 DIAGNOSIS — G43709 Chronic migraine without aura, not intractable, without status migrainosus: Secondary | ICD-10-CM

## 2020-02-29 NOTE — Telephone Encounter (Signed)
Pharmacy requests refill on: Topiramate 100 mg  LAST REFILL: 09/08/2019 LAST OV: 03/30/2019 NEXT OV: Not Scheduled PHARMACY: [facility] [phone]

## 2020-04-11 ENCOUNTER — Encounter: Payer: Self-pay | Admitting: Family Medicine

## 2020-04-11 DIAGNOSIS — F330 Major depressive disorder, recurrent, mild: Secondary | ICD-10-CM

## 2020-04-11 NOTE — Telephone Encounter (Signed)
Pt cancelled CPE with Debbie in Oct, and has no TOC appt with any provider

## 2020-04-12 ENCOUNTER — Telehealth: Payer: Self-pay

## 2020-04-12 DIAGNOSIS — F330 Major depressive disorder, recurrent, mild: Secondary | ICD-10-CM

## 2020-04-12 MED ORDER — CITALOPRAM HYDROBROMIDE 40 MG PO TABS: 40.0000 mg | ORAL_TABLET | Freq: Every day | ORAL | 0 refills | Status: DC

## 2020-04-12 NOTE — Telephone Encounter (Signed)
Pharmacy requests refill on: Citalopram HBR 40 mg   LAST REFILL: 01/10/2020 LAST OV: 12/20/2019 NEXT OV: Not Scheduled  PHARMACY: [id number] [place], [place]

## 2020-04-12 NOTE — Telephone Encounter (Signed)
Please see message from Dr. Milinda Antis and schedule. Thanks

## 2020-04-13 NOTE — Telephone Encounter (Signed)
Called patient to schedule TOC. Stated she is needing her medication sent in asap as she took her last pill this morning and needs another tonight. Scheduled TOC 04/16/20. Please advise.

## 2020-04-16 ENCOUNTER — Encounter: Payer: Self-pay | Admitting: Family Medicine

## 2020-04-16 ENCOUNTER — Ambulatory Visit (INDEPENDENT_AMBULATORY_CARE_PROVIDER_SITE_OTHER): Payer: BC Managed Care – PPO | Admitting: Family Medicine

## 2020-04-16 ENCOUNTER — Other Ambulatory Visit: Payer: Self-pay

## 2020-04-16 VITALS — BP 134/82 | HR 82 | Temp 97.1°F | Ht 72.25 in | Wt 253.4 lb

## 2020-04-16 DIAGNOSIS — M549 Dorsalgia, unspecified: Secondary | ICD-10-CM

## 2020-04-16 DIAGNOSIS — Z23 Encounter for immunization: Secondary | ICD-10-CM | POA: Diagnosis not present

## 2020-04-16 DIAGNOSIS — F330 Major depressive disorder, recurrent, mild: Secondary | ICD-10-CM

## 2020-04-16 DIAGNOSIS — M25511 Pain in right shoulder: Secondary | ICD-10-CM

## 2020-04-16 DIAGNOSIS — F329 Major depressive disorder, single episode, unspecified: Secondary | ICD-10-CM | POA: Diagnosis not present

## 2020-04-16 DIAGNOSIS — E559 Vitamin D deficiency, unspecified: Secondary | ICD-10-CM

## 2020-04-16 DIAGNOSIS — G43709 Chronic migraine without aura, not intractable, without status migrainosus: Secondary | ICD-10-CM

## 2020-04-16 DIAGNOSIS — F411 Generalized anxiety disorder: Secondary | ICD-10-CM | POA: Diagnosis not present

## 2020-04-16 DIAGNOSIS — E669 Obesity, unspecified: Secondary | ICD-10-CM

## 2020-04-16 DIAGNOSIS — E781 Pure hyperglyceridemia: Secondary | ICD-10-CM

## 2020-04-16 DIAGNOSIS — M542 Cervicalgia: Secondary | ICD-10-CM

## 2020-04-16 MED ORDER — TOPIRAMATE 100 MG PO TABS: 100.0000 mg | ORAL_TABLET | Freq: Two times a day (BID) | ORAL | 3 refills | Status: DC

## 2020-04-16 MED ORDER — SUMATRIPTAN SUCCINATE 100 MG PO TABS: ORAL_TABLET | ORAL | 3 refills | Status: DC

## 2020-04-16 MED ORDER — CITALOPRAM HYDROBROMIDE 40 MG PO TABS: 40.0000 mg | ORAL_TABLET | Freq: Every day | ORAL | 3 refills | Status: DC

## 2020-04-16 MED ORDER — TIZANIDINE HCL 4 MG PO CAPS: 4.0000 mg | ORAL_CAPSULE | Freq: Three times a day (TID) | ORAL | 3 refills | Status: DC | PRN

## 2020-04-16 NOTE — Patient Instructions (Addendum)
I placed a referral for counseling for you  You will get a call   Also for neurology You will get a call also   Get back on topamax You can try tizanadine for rescue also   Try to work toward regular exercise  Keep up a good water intake  Try to get most of your carbohydrates from produce (with the exception of white potatoes)  Eat less bread/pasta/rice/snack foods/cereals/sweets and other items from the middle of the grocery store (processed carbs)
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
## 2020-04-16 NOTE — Assessment & Plan Note (Signed)
Reviewed history  Takes topamax 100 mg bid -more breakthrough lately  Variety of triggers incl hormones/weather/ scents -she controls what she can  Also muscle tension-refilled tizanidine which may help (caution of sedation) Refilled this and also imitrex  Placed a ref for neuro (she has not tried botox or any injectable tx yet-may be a candidate)  Enc her to drink water/eat healthy and exercise as tolerated

## 2020-04-16 NOTE — Assessment & Plan Note (Signed)
Discussed how this problem influences overall health and the risks it imposes  Reviewed plan for weight loss with lower calorie diet (via better food choices and also portion control or program like weight watchers) and exercise building up to or more than 30 minutes 5 days per week including some aerobic activity   She and husband are planning some lifestyle change- less processed foods/carbs and getting workout machine

## 2020-04-16 NOTE — Assessment & Plan Note (Signed)
Disc goals for lipids and reasons to control them Rev last labs with pt Rev low sat fat diet in detail Planning on diet change soon and then will disc when to re check this

## 2020-04-16 NOTE — Assessment & Plan Note (Signed)
Has not been taking her D- promises to start back on it  Last level 26.2 in 2020  Will re check in future once she is back on track

## 2020-04-16 NOTE — Assessment & Plan Note (Signed)
Ref to counselor for this and depression  Citalopram 40 mg daily -refilled  Reviewed stressors/ coping techniques/symptoms/ support sources/ tx options and side effects in detail today Grief is still a problem (lost father 2 y ago)   Enc self care/discussed this  She is motivated

## 2020-04-16 NOTE — Assessment & Plan Note (Signed)
With anxiety  Citalopram 40 mg daily  Ref made for counseling  With GAD

## 2020-04-17 ENCOUNTER — Encounter: Payer: Self-pay | Admitting: Neurology

## 2020-06-13 NOTE — Progress Notes (Signed)
NEUROLOGY CONSULTATION NOTE  Caitlyn Harris MRN: [id number] DOB: December 17, 1980  Referring provider: Loura Pardon, MD Primary care provider: Clarene Reamer, FNP  Reason for consult:  migraines  Assessment/Plan:   1.  Chronic migraine without aura 2.  Migraine with aura 3.  Chronic tension type headache  1.  Migraine prevention:  Emgality every 28 days.  Continue topiramate for now with plan to eventually taper off. 2.  Migraine rescue:  She will try Nurtec.  May use tizanidine and sumatriptan if needed. 3.  Stop ibuprofen and acetaminophen.  Limit use of pain relievers to no more than 2 days out of week to prevent risk of rebound or medication-overuse headache. 4.  Keep headache diary 5.  Follow up 4-6 months.    Subjective:  Caitlyn Harris is a 40 year old right-handed female with depression, anxiety and history of infantile febrile seizures who presents for migraines.  History supplemented by primary care notes.  Headaches since middle school.   Migraines are severe stabbing or throbbing, from back of neck and occipital region, either temple, with nausea, photophobia, phonophobia, osmophobia, diarrhea, numbness down the arm of side of headache, sees sparkles in vision.  When severe, she becomes irritable with rage.  Usually lasts 2 hours to 2 days (has lasted 5 days).  Occurs at least 4 to 6 days a month.  Postdrome with fatigue, scalp soreness, photosensitivity.  Triggers include bananas, cilantro, dark chocolate, too much/too little sleep, skipping meals, perfumes, bright lights, cigarette smoke.  Rubbing or applying pressure to area of headache helpful.    Also has daily tension-type headache, moderate nonthrobbing frontal pressure.  Rescue therapy:  First line - usually tizanidine with ibuprofen or acetaminophen with a Coke, sumatriptan 100mg .  Takes a pain reliever day.  Current NSAIDS/analgesics:  acetaminophen Current triptans:  Sumatriptan 100mg  Current ergotamine:   none Current anti-emetic:  none Current muscle relaxants:  Tizanidine 4mg  TID PRN Current Antihypertensive medications:  none Current Antidepressant medications:  Citalopram 40mg  Current Anticonvulsant medications:  topiramate 100mg  BID Current anti-CGRP:  none Current Vitamins/Herbal/Supplements:  none Current Antihistamines/Decongestants:  none Other therapy:  none Hormone/birth control:  none   Past NSAIDS/analgesics:  Toradol Past abortive triptans:  none Past abortive ergotamine:  none Past muscle relaxants:  Flexeril Past anti-emetic:  Zofran Past antihypertensive medications:  Beta blocker Past antidepressant medications: Amitriptyline, Wellbutrin Past anticonvulsant medications:  gabapentin Past anti-CGRP:  none Past vitamins/Herbal/Supplements:  none Past antihistamines/decongestants:  none Other past therapies:  O2 (made headaches worse)  Caffeine:  1 Coke twice a week.  No coffee Diet:  Mostly water.  Tries not to skip meals Exercise:  no Depression:  yes; Anxiety:  yes Other pain:  Mild back pain at times Sleep:  Poor - sleep study - wakes up frequently Family history of migraines:  Maternal grandmother, father, sister, brother, niece    CBC and CMP from April 2021 unremarkable  PAST MEDICAL HISTORY: Past Medical History:  Diagnosis Date  . Anxiety   . Depression   . GERD (gastroesophageal reflux disease)    OCC  . Hyperlipidemia   . Migraine   . 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 ABDOMINAL HYSTERECTOMY    . CERVICAL BIOPSY  W/ LOOP ELECTRODE EXCISION    . ECTOPIC PREGNANCY SURGERY    . endometirosis    . HAND SURGERY     THUMB SURGERY  .  LAPAROSCOPIC OVARIAN CYSTECTOMY Left 03/02/2018   Procedure: LAPAROSCOPIC RIGHT OVARIAN CYSTECTOMY;  Surgeon: Gae Dry, MD;  Location: [facility];  Service: Gynecology;  Laterality: Left;  . LAPAROSCOPIC OVARIAN CYSTECTOMY  Left 08/30/2019   Procedure: LAPAROSCOPIC OVARIAN CYSTECTOMY;  Surgeon: Gae Dry, MD;  Location: [facility];  Service: Gynecology;  Laterality: Left;  . LEEP    . LEEP    . PERINEOPLASTY N/A 03/02/2018   Procedure: PERINEORRHAPHY;  Surgeon: Gae Dry, MD;  Location: [facility];  Service: Gynecology;  Laterality: N/A;  . RECTOCELE REPAIR N/A 03/02/2018   Procedure: POSTERIOR REPAIR (RECTOCELE);  Surgeon: Gae Dry, MD;  Location: [facility];  Service: Gynecology;  Laterality: N/A;  . VENTRAL HERNIA REPAIR N/A 03/19/2017   Procedure: HERNIA REPAIR VENTRAL ADULT;  Surgeon: Florene Glen, MD;  Location: [facility];  Service: General;  Laterality: N/A;    MEDICATIONS: Current Outpatient Medications on File Prior to Visit  Medication Sig Dispense Refill  . acetaminophen (TYLENOL) 500 MG tablet Take 1,000 mg by mouth every 6 (six) hours as needed for moderate pain or headache.    . benzonatate (TESSALON) 200 MG capsule Take 1 capsule (200 mg total) by mouth 3 (three) times daily as needed for cough. Swallow whole, do not bite pill (Patient not taking: Reported on 04/16/2020) 30 capsule 1  . citalopram (CELEXA) 40 MG tablet Take 1 tablet (40 mg total) by mouth at bedtime. 90 tablet 3  . ELDERBERRY PO Take 100 mg by mouth daily. Gummie    . omeprazole (PRILOSEC OTC) 20 MG tablet Take 20 mg by mouth daily as needed (heartburn).     . SUMAtriptan (IMITREX) 100 MG tablet May repeat in 2 hours if headache persists or recurs. 30 tablet 3  . tiZANidine (ZANAFLEX) 4 MG capsule Take 1 capsule (4 mg total) by mouth 3 (three) times daily as needed for muscle spasms. 30 capsule 3  . topiramate (TOPAMAX) 100 MG tablet Take 1 tablet (100 mg total) by mouth 2 (two) times daily. 180 tablet 3   No current facility-administered medications on file prior to visit.    ALLERGIES: Allergies  Allergen Reactions  . Tape     Low sensitivity to tape, ok with Tegaderm and paper tape     FAMILY  HISTORY: Family History  Problem Relation Age of Onset  . Hypertension Mother   . Diabetes Mother   . Renal cancer Father   . Hypertension Father     Objective:  Blood pressure 102/60, pulse (!) 109, height 6' (1.829 m), weight 249 lb 3.2 oz (113 kg), SpO2 98 %. General: No acute distress.  Patient appears well-groomed.   Head:  Normocephalic/atraumatic Eyes:  fundi examined but not visualized Neck: supple, no paraspinal tenderness, full range of motion Back: No paraspinal tenderness Heart: regular rate and rhythm Lungs: Clear to auscultation bilaterally. Vascular: No carotid bruits. Neurological Exam: Mental status: alert and oriented to person, place, and time, recent and remote memory intact, fund of knowledge intact, attention and concentration intact, speech fluent and not dysarthric, language intact. Cranial nerves: CN I: not tested CN II: pupils equal, round and reactive to light, visual fields intact CN III, IV, VI:  full range of motion, no nystagmus, no ptosis CN V: facial sensation intact. CN VII: upper and lower face symmetric CN VIII: hearing intact CN IX, X: gag intact, uvula midline CN XI: sternocleidomastoid and trapezius muscles intact CN XII: tongue midline Bulk & Tone: normal, no fasciculations.  Motor:  muscle strength 5/5 throughout Sensation:  Pinprick, temperature and vibratory sensation intact. Deep Tendon Reflexes:  2+ throughout,  toes downgoing.   Finger to nose testing:  Without dysmetria.   Heel to shin:  Without dysmetria.   Gait:  Normal station and stride.  Romberg negative.    Thank you for allowing me to take part in the care of this patient.  Metta Clines, DO  CC:  Clarene Reamer, [place], MD

## 2020-06-14 ENCOUNTER — Ambulatory Visit: Payer: BC Managed Care – PPO | Admitting: Neurology

## 2020-06-14 ENCOUNTER — Encounter: Payer: Self-pay | Admitting: Neurology

## 2020-06-14 ENCOUNTER — Other Ambulatory Visit: Payer: Self-pay

## 2020-06-14 VITALS — BP 102/60 | HR 109 | Ht 72.0 in | Wt 249.2 lb

## 2020-06-14 DIAGNOSIS — G44229 Chronic tension-type headache, not intractable: Secondary | ICD-10-CM

## 2020-06-14 DIAGNOSIS — G43109 Migraine with aura, not intractable, without status migrainosus: Secondary | ICD-10-CM | POA: Diagnosis not present

## 2020-06-14 DIAGNOSIS — G43709 Chronic migraine without aura, not intractable, without status migrainosus: Secondary | ICD-10-CM

## 2020-06-14 MED ORDER — ONDANSETRON 4 MG PO TBDP: 4.0000 mg | ORAL_TABLET | Freq: Three times a day (TID) | ORAL | 5 refills | Status: DC | PRN

## 2020-06-14 MED ORDER — EMGALITY 120 MG/ML ~~LOC~~ SOAJ: 120.0000 mg | SUBCUTANEOUS | 5 refills | Status: DC

## 2020-06-14 NOTE — Patient Instructions (Signed)
  1. Start Emgality every 28 days 2. Take Nurtec  at earliest onset of headache.  Maximum 1 tablet in 24 hours.  May use as first line and sumatriptan as second line, may use as second line to sumatriptan.  If effective, contact me for script. 3. Ondansetron for nausea 4. Limit use of pain relievers to no more than 2 days out of the week.  These medications include acetaminophen, NSAIDs (ibuprofen/Advil/Motrin, naproxen/Aleve, triptans (Imitrex/sumatriptan), Excedrin, and narcotics.  This will help reduce risk of rebound headaches. 5. Be aware of common food triggers:  - Caffeine:  coffee, black tea, cola, Mt. Dew  - Chocolate  - Dairy:  aged cheeses (brie, blue, cheddar, gouda, [place], provolone, [place], Swiss, etc), chocolate milk, buttermilk, sour cream, limit eggs and yogurt  - Nuts, peanut butter  - Alcohol  - Cereals/grains:  FRESH breads (fresh bagels, sourdough, doughnuts), yeast productions  - Processed/canned/aged/cured meats (pre-packaged deli meats, hotdogs)  - MSG/glutamate:  soy sauce, flavor enhancer, pickled/preserved/marinated foods  - Sweeteners:  aspartame (Equal, Nutrasweet).  Sugar and Splenda are okay  - Vegetables:  legumes (lima beans, lentils, snow peas, fava beans, pinto peans, peas, garbanzo beans), sauerkraut, onions, olives, pickles  - Fruit:  avocados, bananas, citrus fruit (orange, lemon, grapefruit), mango  - Other:  Frozen meals, macaroni and cheese 6. Routine exercise 7. Stay adequately hydrated (aim for 64 oz water daily) 8. Keep headache diary 9. Maintain proper stress management 10. Maintain proper sleep hygiene 11. Do not skip meals 12. Consider supplements:  magnesium citrate 400mg  daily, riboflavin 400mg  daily, coenzyme Q10 100mg  three times daily.

## 2020-06-27 ENCOUNTER — Telehealth (INDEPENDENT_AMBULATORY_CARE_PROVIDER_SITE_OTHER): Payer: BC Managed Care – PPO | Admitting: Family Medicine

## 2020-06-27 ENCOUNTER — Other Ambulatory Visit: Payer: Self-pay

## 2020-06-27 ENCOUNTER — Encounter: Payer: Self-pay | Admitting: Family Medicine

## 2020-06-27 DIAGNOSIS — J0100 Acute maxillary sinusitis, unspecified: Secondary | ICD-10-CM

## 2020-06-27 DIAGNOSIS — J019 Acute sinusitis, unspecified: Secondary | ICD-10-CM | POA: Insufficient documentation

## 2020-06-27 MED ORDER — AMOXICILLIN-POT CLAVULANATE 875-125 MG PO TABS: 1.0000 | ORAL_TABLET | Freq: Two times a day (BID) | ORAL | 0 refills | Status: DC

## 2020-06-27 NOTE — Patient Instructions (Signed)
Drink fluids and rest when you can  Take augmentin as directed  Tessalon for cough  Update if not starting to improve in a week or if worsening

## 2020-06-27 NOTE — Progress Notes (Signed)
Virtual Visit via Video Note  I connected with Caitlyn Harris on 06/27/20 at  8:00 AM EDT by a video enabled telemedicine application and verified that I am speaking with the correct person using two identifiers.  Location: Patient: home Provider: office   I discussed the limitations of evaluation and management by telemedicine and the availability of in person appointments. The patient expressed understanding and agreed to proceed.  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 Acute sinusitis 06/27/2020  . Obesity (BMI 30-39.9) 04/16/2020  . Suspected COVID-19 virus infection 12/20/2019  . Hypertriglyceridemia 06/11/2018  . Endometriosis 02/01/2018  . Chronic female pelvic pain 02/01/2018  . Pelvic pain 01/18/2018  . History of endometriosis 01/18/2018  . Rectocele 01/18/2018  . Insomnia 08/25/2017  . Cystocele, midline 08/25/2017  . Umbilical hernia without obstruction and without gangrene   . Chronic migraine without aura without status migrainosus, not intractable 02/04/2016  . Depression 02/04/2016  . Generalized anxiety disorder 02/04/2016  . Vitamin D deficiency, unspecified 02/04/2016   Past Medical History:  Diagnosis Date  . Anxiety   .  Depression   . Endometriosis   . GERD (gastroesophageal reflux disease)    OCC  . History of PCOS   . Hyperlipidemia   . Migraine   . 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 ABDOMINAL HYSTERECTOMY    . CERVICAL BIOPSY  W/ LOOP ELECTRODE EXCISION    . ECTOPIC PREGNANCY SURGERY    . endometirosis    . HAND SURGERY     THUMB SURGERY  . LAPAROSCOPIC OVARIAN CYSTECTOMY Left 03/02/2018   Procedure: LAPAROSCOPIC RIGHT OVARIAN CYSTECTOMY;  Surgeon: Gae Dry, MD;  Location: [facility];  Service: Gynecology;  Laterality: Left;  . LAPAROSCOPIC OVARIAN CYSTECTOMY Left 08/30/2019   Procedure: LAPAROSCOPIC OVARIAN CYSTECTOMY;  Surgeon: Gae Dry, MD;  Location: [facility];  Service: Gynecology;  Laterality: Left;  . LEEP    . LEEP    . PERINEOPLASTY N/A 03/02/2018   Procedure: PERINEORRHAPHY;  Surgeon: Gae Dry, MD;  Location: [facility];  Service: Gynecology;  Laterality: N/A;  . RECTOCELE REPAIR N/A 03/02/2018   Procedure: POSTERIOR REPAIR (RECTOCELE);  Surgeon: Gae Dry, MD;  Location: [facility];  Service: Gynecology;  Laterality: N/A;  . VENTRAL HERNIA REPAIR N/A 03/19/2017   Procedure: HERNIA REPAIR VENTRAL ADULT;  Surgeon: Florene Glen, MD;  Location: [facility];  Service: General;  Laterality: N/A;   Social History   Tobacco Use  . Smoking status: Former Smoker    Years: 7.00    Types: Cigarettes    Quit date: 03/12/2009  Years since quitting: 11.3  . Smokeless tobacco: Never Used  . Tobacco comment: 1 PACK EVERY 2 WEEKS  Vaping Use  . Vaping Use: Never used  Substance Use Topics  . Alcohol use: Yes    Comment: RARE  . Drug use: No   Family History  Problem Relation Age of Onset  . Hypertension Mother   . Diabetes Mother   . Renal cancer Father   . Hypertension Father    Allergies  Allergen Reactions  . Tape     Low sensitivity to tape, ok with Tegaderm and paper tape    Current  Outpatient Medications on File Prior to Visit  Medication Sig Dispense Refill  . acetaminophen (TYLENOL) 500 MG tablet Take 1,000 mg by mouth every 6 (six) hours as needed for moderate pain or headache.    . citalopram (CELEXA) 40 MG tablet Take 1 tablet (40 mg total) by mouth at bedtime. 90 tablet 3  . Galcanezumab-gnlm (EMGALITY) 120 MG/ML SOAJ Inject 120 mg into the skin every 28 (twenty-eight) days. 1.12 mL 5  . omeprazole (PRILOSEC OTC) 20 MG tablet Take 20 mg by mouth daily as needed (heartburn).     . ondansetron (ZOFRAN ODT) 4 MG disintegrating tablet Take 1 tablet (4 mg total) by mouth every 8 (eight) hours as needed for nausea or vomiting. 20 tablet 5  . SUMAtriptan (IMITREX) 100 MG tablet May repeat in 2 hours if headache persists or recurs. 30 tablet 3  . tiZANidine (ZANAFLEX) 4 MG capsule Take 1 capsule (4 mg total) by mouth 3 (three) times daily as needed for muscle spasms. 30 capsule 3  . topiramate (TOPAMAX) 100 MG tablet Take 1 tablet (100 mg total) by mouth 2 (two) times daily. 180 tablet 3   No current facility-administered medications on file prior to visit.   Review of Systems  Constitutional: Positive for malaise/fatigue. Negative for chills and fever.  HENT: Positive for congestion and sinus pain. Negative for ear pain and sore throat.   Eyes: Negative for blurred vision, discharge and redness.  Respiratory: Positive for cough and sputum production. Negative for shortness of breath, wheezing and stridor.   Cardiovascular: Negative for chest pain, palpitations and leg swelling.  Gastrointestinal: Negative for abdominal pain, diarrhea, nausea and vomiting.  Musculoskeletal: Negative for myalgias.  Skin: Negative for rash.  Neurological: Positive for headaches. Negative for dizziness.    Observations/Objective: Pt sounded well, not distressed Congested voice occ clears throat  Cough sounds dry  No wheezing audible Nl mood  Good historian/nl cognition   Assessment  and Plan: Problem List Items Addressed This Visit      Respiratory   Acute sinusitis    1 month s/p viral uri with neg covid testing  Adv fluids/rest  augmentin as directed Watch for worse cough or wheezing Has tessalon at home Update if not starting to improve in a week or if worsening    Meds ordered this encounter  Medications  . amoxicillin-clavulanate (AUGMENTIN) 875-125 MG tablet    Sig: Take 1 tablet by mouth 2 (two) times daily.    Dispense:  14 tablet    Refill:  0         Relevant Medications   amoxicillin-clavulanate (AUGMENTIN) 875-125 MG tablet       Follow Up Instructions: Drink fluids and rest when you can  Take augmentin as directed  Tessalon for cough  Update if not starting to improve in a week or if worsening  I discussed the assessment and treatment plan with the patient. The patient was provided an opportunity to ask questions and all were answered. The patient agreed with the plan and demonstrated an understanding of the instructions.   The patient was advised to call back or seek an in-person evaluation if the symptoms worsen or if the condition fails to improve as anticipated.  I provided 16 minutes of non-face-to-face time during this encounter.   Loura Pardon, MD

## 2020-06-27 NOTE — Assessment & Plan Note (Signed)
1 month s/p viral uri with neg covid testing  Adv fluids/rest  augmentin as directed Watch for worse cough or wheezing Has tessalon at home Update if not starting to improve in a week or if worsening    Meds ordered this encounter  Medications  . amoxicillin-clavulanate (AUGMENTIN) 875-125 MG tablet    Sig: Take 1 tablet by mouth 2 (two) times daily.    Dispense:  14 tablet    Refill:  0

## 2020-08-06 ENCOUNTER — Telehealth: Payer: Self-pay | Admitting: Family Medicine

## 2020-08-06 NOTE — Telephone Encounter (Signed)
Cannot take new folks right now /trying to catch up

## 2020-08-06 NOTE — Telephone Encounter (Signed)
Caitlyn Harris called in wanted to know if Dr. Glori Bickers would take her mom as a patient she is moving down [place]. And she is on Peritoneal dialysis.

## 2020-08-09 NOTE — Telephone Encounter (Signed)
Called and discussed with patient. I gave her the information for the offices that are taking new patients. Pt verbalized understanding and thanked me for calling.

## 2020-09-06 ENCOUNTER — Other Ambulatory Visit: Payer: Self-pay | Admitting: Obstetrics & Gynecology

## 2020-12-19 ENCOUNTER — Other Ambulatory Visit: Payer: Self-pay | Admitting: Neurology

## 2020-12-24 NOTE — Progress Notes (Signed)
NEUROLOGY FOLLOW UP OFFICE NOTE  Caitlyn Harris [id number]  Assessment/Plan:   Chronic migraine without aura Migraine with aura Chronic tension-type headache  Migraine prevention:  To achieve better headache control, will change from topiramate to propranolol ER 80mg  daily.  Continue Emgality Migraine rescue:  sumatriptan 100mg  Limit use of pain relievers to no more than 2 days out of week to prevent risk of rebound or medication-overuse headache. Keep headache diary Follow up 6 months.   Subjective:  Caitlyn Harris is a 40 year old right-handed female with depression, anxiety and history of infantile febrile seizures who follows up for migraine.  UPDATE: Started Emgality in March Intensity: 10/10 Duration:  a couple of hours Frequency:  2 days a month She has a dull headache everyday ("sinus headache" or tension-type headache) - usually 3-5/10  Rescue therapy:  sumatriptan and usually tizanidine with ibuprofen or acetaminophen with a Coke   Medication frequency:  Tylenol once a week.   Current NSAIDS/analgesics:  acetaminophen Current triptans:  Sumatriptan 100mg  Current ergotamine:  none Current anti-emetic:  Zofran ODT Current muscle relaxants:  Tizanidine 4mg  TID PRN Current Antihypertensive medications:  none Current Antidepressant medications:  Citalopram 40mg  Current Anticonvulsant medications:  topiramate 100mg  BID Current anti-CGRP:  Emgality Q28d Current Vitamins/Herbal/Supplements:  none Current Antihistamines/Decongestants:  Sudafed (for sinus headache) Other therapy:  none Hormone/birth control:  none  Caffeine:  1 Coke twice a week.  No coffee Diet:  Mostly water.  Tries not to skip meals Exercise:  no Depression:  yes; Anxiety:  yes Other pain:  Mild back pain at times Sleep:  Poor - sleep study - wakes up frequently  HISTORY:  Headaches since middle school.   Migraines are severe stabbing or throbbing, from back of neck and occipital  region, either temple, with nausea, photophobia, phonophobia, osmophobia, diarrhea, numbness down the arm of side of headache, sees sparkles in vision.  When severe, she becomes irritable with rage.  Usually lasts 2 hours to 2 days (has lasted 5 days).  Occurs at least 4 to 6 days a month.  Postdrome with fatigue, scalp soreness, photosensitivity.  Triggers include bananas, cilantro, dark chocolate, too much/too little sleep, skipping meals, perfumes, bright lights, cigarette smoke.  Rubbing or applying pressure to area of headache helpful.     Also has daily tension-type headache, moderate nonthrobbing frontal pressure.    Past NSAIDS/analgesics:  Toradol Past abortive triptans:  none Past abortive ergotamine:  none Past muscle relaxants:  Flexeril Past anti-emetic:  Zofran Past antihypertensive medications:  Beta blocker Past antidepressant medications: Amitriptyline, Wellbutrin Past anticonvulsant medications:  gabapentin Past anti-CGRP:  Nurtec (rescue - ineffective) Past vitamins/Herbal/Supplements:  none Past antihistamines/decongestants:  none Other past therapies:  O2 (made headaches worse)    Family history of migraines:  Maternal grandmother, father, sister, brother, niece  PAST MEDICAL HISTORY: Past Medical History:  Diagnosis Date   Anxiety    Depression    Endometriosis    GERD (gastroesophageal reflux disease)    OCC   History of PCOS    Hyperlipidemia    Migraine    Seizures ([place])    FEBRILE AS A BABY    MEDICATIONS: Current Outpatient Medications on File Prior to Visit  Medication Sig Dispense Refill   acetaminophen (TYLENOL) 500 MG tablet Take 1,000 mg by mouth every 6 (six) hours as needed for moderate pain or headache.     amoxicillin-clavulanate (AUGMENTIN) 875-125 MG tablet Take 1 tablet by mouth 2 (two) times daily. [place]  tablet 0   citalopram (CELEXA) 40 MG tablet Take 1 tablet (40 mg total) by mouth at bedtime. 90 tablet 3   EMGALITY 120 MG/ML SOAJ  INJECT 120 MG INTO THE SKIN EVERY 28 (TWENTY-EIGHT) DAYS. 1 mL 0   omeprazole (PRILOSEC OTC) 20 MG tablet Take 20 mg by mouth daily as needed (heartburn).      ondansetron (ZOFRAN ODT) 4 MG disintegrating tablet Take 1 tablet (4 mg total) by mouth every 8 (eight) hours as needed for nausea or vomiting. 20 tablet 5   SUMAtriptan (IMITREX) 100 MG tablet May repeat in 2 hours if headache persists or recurs. 30 tablet 3   tiZANidine (ZANAFLEX) 4 MG capsule Take 1 capsule (4 mg total) by mouth 3 (three) times daily as needed for muscle spasms. 30 capsule 3   topiramate (TOPAMAX) 100 MG tablet Take 1 tablet (100 mg total) by mouth 2 (two) times daily. 180 tablet 3   No current facility-administered medications on file prior to visit.    ALLERGIES: Allergies  Allergen Reactions   Tape     Low sensitivity to tape, ok with Tegaderm and paper tape     FAMILY HISTORY: Family History  Problem Relation Age of Onset   Hypertension Mother    Diabetes Mother    Renal cancer Father    Hypertension Father       Objective:  Blood pressure 119/84, pulse 82, height 6\' 1"  (1.854 m), weight 252 lb 12.8 oz (114.7 kg), SpO2 98 %. General: No acute distress.  Patient appears well-groomed.    Metta Clines, DO  CC: Loura Pardon, MD

## 2020-12-25 ENCOUNTER — Other Ambulatory Visit: Payer: Self-pay

## 2020-12-25 ENCOUNTER — Other Ambulatory Visit: Payer: Self-pay | Admitting: Family Medicine

## 2020-12-25 ENCOUNTER — Ambulatory Visit: Payer: BC Managed Care – PPO | Admitting: Neurology

## 2020-12-25 ENCOUNTER — Encounter: Payer: Self-pay | Admitting: Neurology

## 2020-12-25 VITALS — BP 119/84 | HR 82 | Ht 73.0 in | Wt 252.8 lb

## 2020-12-25 DIAGNOSIS — G43709 Chronic migraine without aura, not intractable, without status migrainosus: Secondary | ICD-10-CM

## 2020-12-25 DIAGNOSIS — F330 Major depressive disorder, recurrent, mild: Secondary | ICD-10-CM

## 2020-12-25 MED ORDER — PROPRANOLOL HCL ER 80 MG PO CP24: 80.0000 mg | ORAL_CAPSULE | Freq: Every day | ORAL | 5 refills | Status: DC

## 2020-12-25 NOTE — Patient Instructions (Signed)
Start propranolol ER 80mg  daily.  If no improvement in 2 months, contact me and we can increase dose.  Caution for lightheadedness Continue Emgality every 28 days Taper off topiramate - 1/2 tablet twice daily for one week, then 1/2 tablet at bedtime for a week, then STOP Use sumatriptan as needed.  Limit use of pain relievers to no more than 2 days out of week to prevent risk of rebound or medication-overuse headache. Keep headache diary Follow up 6 months.

## 2021-01-12 ENCOUNTER — Other Ambulatory Visit: Payer: Self-pay | Admitting: Family Medicine

## 2021-01-14 NOTE — Telephone Encounter (Signed)
Looks like pt recently saw Neuro for Migraines, will route refill to them since med not on med list anymore

## 2021-01-31 ENCOUNTER — Other Ambulatory Visit: Payer: Self-pay | Admitting: Neurology

## 2021-02-06 ENCOUNTER — Encounter: Payer: Self-pay | Admitting: Nurse Practitioner

## 2021-02-06 ENCOUNTER — Other Ambulatory Visit: Payer: Self-pay

## 2021-02-06 ENCOUNTER — Telehealth: Payer: BC Managed Care – PPO | Admitting: Nurse Practitioner

## 2021-02-06 VITALS — Ht 73.0 in | Wt 252.0 lb

## 2021-02-06 DIAGNOSIS — R0981 Nasal congestion: Secondary | ICD-10-CM

## 2021-02-06 DIAGNOSIS — R051 Acute cough: Secondary | ICD-10-CM

## 2021-02-06 DIAGNOSIS — J029 Acute pharyngitis, unspecified: Secondary | ICD-10-CM | POA: Diagnosis not present

## 2021-02-06 MED ORDER — GUAIFENESIN-CODEINE 100-10 MG/5ML PO SOLN: 5.0000 mL | Freq: Two times a day (BID) | ORAL | 0 refills | Status: AC | PRN

## 2021-02-06 NOTE — Assessment & Plan Note (Signed)
Patient has been taking over-the-counter medications with minimal relief.  We will send in something for cough in the meantime I will await appropriate screening testing.  Patient pending screening tests.

## 2021-02-06 NOTE — Assessment & Plan Note (Signed)
Pending strep test.  Continue to monitor.  Use over-the-counter regimens to help with symptom management.

## 2021-02-06 NOTE — Progress Notes (Signed)
Patient ID: Caitlyn Harris, female    DOB: 11-Dec-1980, 40 y.o.   MRN: [id number]  Virtual visit completed through [place], a video enabled telemedicine application. Due to national recommendations of social distancing due to COVID-19, a virtual visit is felt to be most appropriate for this patient at this time. Reviewed limitations, risks, security and privacy concerns of performing a virtual visit and the availability of in person appointments. I also reviewed that there may be a patient responsible charge related to this service. The patient agreed to proceed.   Patient location: home Provider location:  at [facility], office Persons participating in this virtual visit: patient, provider   If any vitals were documented, they were collected by patient at home unless specified below.    Ht 6\' 1"  (1.854 m)   Wt 252 lb (114.3 kg)   BMI 33.25 kg/m    CC:  Subjective:   HPI: Caitlyn Harris is a 40 y.o. female presenting on 02/06/2021 for Sore Throat, Cough, and Nasal Congestion (Runny nose/)   Symptoms started 10/30 or 31st Has not test for covid Vaccinated Cough medicine and aleve cold and sinus. Helped minimally    Relevant past medical, surgical, family and social history reviewed and updated as indicated. Interim medical history since our last visit reviewed. Allergies and medications reviewed and updated. Outpatient Medications Prior to Visit  Medication Sig Dispense Refill   acetaminophen (TYLENOL) 500 MG tablet Take 1,000 mg by mouth every 6 (six) hours as needed for moderate pain or headache.     citalopram (CELEXA) 40 MG tablet TAKE 1 TABLET BY MOUTH EVERYDAY AT BEDTIME 90 tablet 1   EMGALITY 120 MG/ML SOAJ INJECT 120 MG INTO THE SKIN EVERY 28 (TWENTY-EIGHT) DAYS. 1 mL 2   omeprazole (PRILOSEC OTC) 20 MG tablet Take 20 mg by mouth daily as needed (heartburn).      ondansetron (ZOFRAN ODT) 4 MG disintegrating tablet Take 1 tablet (4 mg total) by mouth  every 8 (eight) hours as needed for nausea or vomiting. 20 tablet 5   propranolol ER (INDERAL LA) 80 MG 24 hr capsule Take 1 capsule (80 mg total) by mouth daily. 30 capsule 5   SUMAtriptan (IMITREX) 100 MG tablet May repeat in 2 hours if headache persists or recurs. 30 tablet 3   tiZANidine (ZANAFLEX) 4 MG capsule Take 1 capsule (4 mg total) by mouth 3 (three) times daily as needed for muscle spasms. 30 capsule 3   topiramate (TOPAMAX) 100 MG tablet Take 100 mg by mouth 2 (two) times daily.     No facility-administered medications prior to visit.     Per HPI unless specifically indicated in ROS section below Review of Systems  Constitutional:  Positive for chills and fatigue. Negative for fever.  HENT:  Positive for congestion, ear pain (full feeling), rhinorrhea, sinus pressure and sore throat.   Respiratory:  Positive for cough (thick brown, yellow) and shortness of breath (Tightness and with coughing).   Cardiovascular:  Positive for chest pain (tender to touch).  Gastrointestinal:  Positive for nausea. Negative for abdominal pain, diarrhea and vomiting.  Musculoskeletal:  Positive for arthralgias.  Neurological:  Positive for headaches. Negative for dizziness and light-headedness.  Objective:  Ht 6\' 1"  (1.854 m)   Wt 252 lb (114.3 kg)   BMI 33.25 kg/m   Wt Readings from Last 3 Encounters:  02/06/21 252 lb (114.3 kg)  12/25/20 252 lb 12.8 oz (114.7 kg)  06/14/20 249 lb 3.2  oz (113 kg)       Physical exam: Gen: alert, NAD, not ill appearing Pulm: speaks in complete sentences without increased work of breathing Psych: normal mood, normal thought content      Results for orders placed or performed during the hospital encounter of 08/30/19  Surgical pathology  Result Value Ref Range   SURGICAL PATHOLOGY      SURGICAL PATHOLOGY CASE: [id number] PATIENT: Sheilah Mins Surgical Pathology Report     Specimen Submitted: A. Ovarian cyst, left  Clinical History:  Pelvic pain R10.2, left ovarian cyst N83.202      DIAGNOSIS: A. OVARIAN CYST, LEFT; CYSTECTOMY: - BENIGN SIMPLE CYST. - NEGATIVE FOR MALIGNANCY.   GROSS DESCRIPTION: A. Labeled: Left ovarian cyst Received: In formalin Tissue fragment(s): 3 Size: 2.7 x 1.6 x 0.4 cm Description: Aggregate of pink-red tissue fragments, serially sectioned Entirely submitted in 1 cassette.   Final Diagnosis performed by Betsy Pries, MD.   Electronically signed 08/31/2019 10:12:45AM The electronic signature indicates that the named Attending Pathologist has evaluated the specimen Technical component performed at [facility], [address] [place], [place] [address] Lab: [phone] Dir: Rush Farmer, MD, MMM  Professional component performed at [facility], [facility], [place], [place] , [place] [address] Lab: [phone] Dir: Dellia Nims. Reuel Derby, MD    Assessment & Plan:   Problem List Items Addressed This Visit       Other   Acute cough - Primary    Patient has been taking over-the-counter medications with minimal relief.  We will send in something for cough in the meantime I will await appropriate screening testing.  Patient pending screening tests.      Relevant Medications   guaiFENesin-codeine 100-10 MG/5ML syrup   Sore throat    Pending strep test.  Continue to monitor.  Use over-the-counter regimens to help with symptom management.      Relevant Orders   Rapid Strep A   Nasal congestion     No orders of the defined types were placed in this encounter.  No orders of the defined types were placed in this encounter.   I discussed the assessment and treatment plan with the patient. The patient was provided an opportunity to ask questions and all were answered. The patient agreed with the plan and demonstrated an understanding of the instructions. The patient was advised to call back or seek an in-person evaluation if the symptoms worsen or if the condition  fails to improve as anticipated.  Follow up plan: No follow-ups on file.  Romilda Garret, NP

## 2021-02-07 ENCOUNTER — Other Ambulatory Visit (INDEPENDENT_AMBULATORY_CARE_PROVIDER_SITE_OTHER): Payer: BC Managed Care – PPO

## 2021-02-07 DIAGNOSIS — J029 Acute pharyngitis, unspecified: Secondary | ICD-10-CM | POA: Diagnosis not present

## 2021-02-07 LAB — POCT RAPID STREP A (OFFICE): Rapid Strep A Screen: NEGATIVE

## 2021-02-07 LAB — POCT INFLUENZA A/B
Influenza A, POC: NEGATIVE
Influenza B, POC: NEGATIVE

## 2021-02-15 ENCOUNTER — Encounter: Payer: Self-pay | Admitting: Nurse Practitioner

## 2021-02-15 DIAGNOSIS — J40 Bronchitis, not specified as acute or chronic: Secondary | ICD-10-CM

## 2021-02-15 DIAGNOSIS — R0982 Postnasal drip: Secondary | ICD-10-CM

## 2021-02-18 MED ORDER — FLUTICASONE PROPIONATE 50 MCG/ACT NA SUSP: 2.0000 | Freq: Every day | NASAL | 0 refills | Status: DC

## 2021-02-18 MED ORDER — AZITHROMYCIN 250 MG PO TABS: ORAL_TABLET | ORAL | 0 refills | Status: AC

## 2021-02-18 NOTE — Telephone Encounter (Signed)
Pt returned call . Requesting a call back [phone]

## 2021-03-10 ENCOUNTER — Other Ambulatory Visit: Payer: Self-pay | Admitting: Family Medicine

## 2021-03-10 DIAGNOSIS — G43709 Chronic migraine without aura, not intractable, without status migrainosus: Secondary | ICD-10-CM

## 2021-03-11 NOTE — Telephone Encounter (Signed)
Last filled on 04/16/20 #180 with 3 refills, TOC appt was on 04/16/20 with PCP

## 2021-06-20 ENCOUNTER — Other Ambulatory Visit: Payer: Self-pay | Admitting: Neurology

## 2021-06-24 ENCOUNTER — Other Ambulatory Visit: Payer: Self-pay | Admitting: Family Medicine

## 2021-06-24 ENCOUNTER — Other Ambulatory Visit: Payer: Self-pay | Admitting: Neurology

## 2021-06-24 DIAGNOSIS — G43709 Chronic migraine without aura, not intractable, without status migrainosus: Secondary | ICD-10-CM

## 2021-06-24 NOTE — Telephone Encounter (Signed)
Appt scheduled 06/26/21 for GI sxs, last filled on 04/16/20, #30 tabs with 2 refills ?

## 2021-06-26 ENCOUNTER — Ambulatory Visit: Payer: BC Managed Care – PPO | Admitting: Family Medicine

## 2021-06-26 ENCOUNTER — Other Ambulatory Visit: Payer: Self-pay

## 2021-06-26 ENCOUNTER — Encounter: Payer: Self-pay | Admitting: Family Medicine

## 2021-06-26 VITALS — BP 126/82 | HR 91 | Temp 97.9°F | Ht 72.25 in | Wt 257.0 lb

## 2021-06-26 DIAGNOSIS — R140 Abdominal distension (gaseous): Secondary | ICD-10-CM | POA: Insufficient documentation

## 2021-06-26 DIAGNOSIS — R197 Diarrhea, unspecified: Secondary | ICD-10-CM | POA: Insufficient documentation

## 2021-06-26 DIAGNOSIS — F411 Generalized anxiety disorder: Secondary | ICD-10-CM | POA: Diagnosis not present

## 2021-06-26 DIAGNOSIS — R109 Unspecified abdominal pain: Secondary | ICD-10-CM | POA: Insufficient documentation

## 2021-06-26 DIAGNOSIS — R1011 Right upper quadrant pain: Secondary | ICD-10-CM

## 2021-06-26 DIAGNOSIS — R12 Heartburn: Secondary | ICD-10-CM | POA: Insufficient documentation

## 2021-06-26 DIAGNOSIS — R1084 Generalized abdominal pain: Secondary | ICD-10-CM | POA: Insufficient documentation

## 2021-06-26 MED ORDER — FAMOTIDINE 20 MG PO TABS: 20.0000 mg | ORAL_TABLET | Freq: Two times a day (BID) | ORAL | 3 refills | Status: DC

## 2021-06-26 NOTE — Assessment & Plan Note (Signed)
Intermittent and worst in RUQ (does radiate to R shoulder blade)  ?Bloating and nausea and food intolerance ?Diarrhea on and off, also heartburn ?Reassuring exam  ?Labs ordered  ?Also [place] of abd to r/o gallstones  ?

## 2021-06-26 NOTE — Assessment & Plan Note (Signed)
Per pt -suspect IBS in past  ?Had colonoscopy 2011  ?Disc potential benefit of fiber supplement and probiotic  ?Labs pending  ?

## 2021-06-26 NOTE — Progress Notes (Signed)
? ?Subjective:  ? ? Patient ID: Caitlyn Harris, female    DOB: February 15, 1981, 41 y.o.   MRN: [id number] ? ?This visit occurred during the SARS-CoV-2 public health emergency.  Safety protocols were in place, including screening questions prior to the visit, additional usage of staff PPE, and extensive cleaning of exam room while observing appropriate contact time as indicated for disinfecting solutions.  ? ?HPI ?Pt presents for GI symptoms and R shoulder pain  ?Concerns about concentration  ? ?Wt Readings from Last 3 Encounters:  ?06/26/21 257 lb (116.6 kg)  ?02/06/21 252 lb (114.3 kg)  ?12/25/20 252 lb 12.8 oz (114.7 kg)  ? ?34.61 kg/m? ? ?Putting off the GI stuff for a long time  ? ?Occ heartburn  ?Now more often- has had to take omeprazole daily  ?Fullness in epigastric area /uncomfortable  ?More gas -burping and flatus  ?Horrible gas pains  ? ?Now lots of foods bother her /not just dairy  ?Gets nauseated (no vomiting) and diarrhea  ?Can get diarrhea in the middle of the night  ?Pain in lower abd- both sides  ?Now R shoulder blade area  ?More pressure when she takes a deep breath  ? ?More chills/cold natured lately, this is new  ?No fever however  ?Appetite is on/off  ? ?Very frustrating  ?No fiber supplement  ?Tried probiotic , not helpful  ? ?Has never had a gallbladder problem  ? ?Has rectocele-has to manually help bm if firm  ?No blood in her stool  ?Narrow stools chronically  ? ?Worse pain to eat nuts/corn/popcorn  ? ? ?Colonscopy 2011 in [place]  (negative) with neg bx  ?Thinks she was dx with IBS ?Labeled herself as dairy intol-gives her diarrhea  ? ?Fiber : not  ?Probiotic  ? ? ?Lab Results  ?Component Value Date  ? WBC 5.8 08/26/2019  ? HGB 13.7 08/26/2019  ? HCT 39.0 08/26/2019  ? MCV 88.6 08/26/2019  ? PLT 190 08/26/2019  ? ?Diagnosed with ADHD years ago  ?Was treated for a while ?Stopped med when she got pregnant  ? ?Is more depressed ?Stress is very high  ? ?Takes celexa and it helps ? ?Patient  Active Problem List  ? Diagnosis Date Noted  ? Abdominal pain 06/26/2021  ? Bloating 06/26/2021  ? Diarrhea 06/26/2021  ? Heartburn 06/26/2021  ? Acute cough 02/06/2021  ? Sore throat 02/06/2021  ? Nasal congestion 02/06/2021  ? Obesity (BMI 30-39.9) 04/16/2020  ? Suspected COVID-19 virus infection 12/20/2019  ? Hypertriglyceridemia 06/11/2018  ? Endometriosis 02/01/2018  ? Chronic female pelvic pain 02/01/2018  ? Pelvic pain 01/18/2018  ? History of endometriosis 01/18/2018  ? Rectocele 01/18/2018  ? Insomnia 08/25/2017  ? Cystocele, midline 08/25/2017  ? Umbilical hernia without obstruction and without gangrene   ? Chronic migraine without aura without status migrainosus, not intractable 02/04/2016  ? Depression 02/04/2016  ? Generalized anxiety disorder 02/04/2016  ? Vitamin D deficiency, unspecified 02/04/2016  ? ?Past Medical History:  ?Diagnosis Date  ? Anxiety   ? Depression   ? Endometriosis   ? GERD (gastroesophageal reflux disease)   ? OCC  ? History of PCOS   ? Hyperlipidemia   ? Migraine   ? Seizures ([place])   ? FEBRILE AS A BABY  ? ?Past Surgical History:  ?Procedure Laterality Date  ? ABDOMINAL HYSTERECTOMY    ? CERVICAL BIOPSY  W/ LOOP ELECTRODE EXCISION    ? ECTOPIC PREGNANCY SURGERY    ? endometirosis    ?  HAND SURGERY    ? THUMB SURGERY  ? LAPAROSCOPIC OVARIAN CYSTECTOMY Left 03/02/2018  ? Procedure: LAPAROSCOPIC RIGHT OVARIAN CYSTECTOMY;  Surgeon: Gae Dry, MD;  Location: [facility];  Service: Gynecology;  Laterality: Left;  ? LAPAROSCOPIC OVARIAN CYSTECTOMY Left 08/30/2019  ? Procedure: LAPAROSCOPIC OVARIAN CYSTECTOMY;  Surgeon: Gae Dry, MD;  Location: [facility];  Service: Gynecology;  Laterality: Left;  ? LEEP    ? LEEP    ? PERINEOPLASTY N/A 03/02/2018  ? Procedure: PERINEORRHAPHY;  Surgeon: Gae Dry, MD;  Location: [facility];  Service: Gynecology;  Laterality: N/A;  ? RECTOCELE REPAIR N/A 03/02/2018  ? Procedure: POSTERIOR REPAIR (RECTOCELE);  Surgeon: Gae Dry, MD;   Location: [facility];  Service: Gynecology;  Laterality: N/A;  ? VENTRAL HERNIA REPAIR N/A 03/19/2017  ? Procedure: HERNIA REPAIR VENTRAL ADULT;  Surgeon: Florene Glen, MD;  Location: [facility];  Service: General;  Laterality: N/A;  ? ?Social History  ? ?Tobacco Use  ? Smoking status: Former  ?  Years: 7.00  ?  Types: Cigarettes  ?  Quit date: 03/12/2009  ?  Years since quitting: 12.2  ? Smokeless tobacco: Never  ? Tobacco comments:  ?  1 PACK EVERY 2 WEEKS  ?Vaping Use  ? Vaping Use: Never used  ?Substance Use Topics  ? Alcohol use: Yes  ?  Comment: RARE  ? Drug use: No  ? ?Family History  ?Problem Relation Age of Onset  ? Hypertension Mother   ? Diabetes Mother   ? Renal cancer Father   ? Hypertension Father   ? ?Allergies  ?Allergen Reactions  ? Tape   ?  Low sensitivity to tape, ok with Tegaderm and paper tape   ? ?Current Outpatient Medications on File Prior to Visit  ?Medication Sig Dispense Refill  ? acetaminophen (TYLENOL) 500 MG tablet Take 1,000 mg by mouth every 6 (six) hours as needed for moderate pain or headache.    ? citalopram (CELEXA) 40 MG tablet TAKE 1 TABLET BY MOUTH EVERYDAY AT BEDTIME 90 tablet 1  ? EMGALITY 120 MG/ML SOAJ INJECT 120 MG INTO THE SKIN EVERY 28 (TWENTY-EIGHT) DAYS. 1 mL 0  ? fluticasone (FLONASE) 50 MCG/ACT nasal spray Place 2 sprays into both nostrils daily. 16 g 0  ? ondansetron (ZOFRAN-ODT) 4 MG disintegrating tablet TAKE 1 TABLET BY MOUTH EVERY 8 HOURS AS NEEDED FOR NAUSEA AND VOMITING 18 tablet 0  ? propranolol ER (INDERAL LA) 80 MG 24 hr capsule TAKE 1 CAPSULE BY MOUTH EVERY DAY 30 capsule 0  ? SUMAtriptan (IMITREX) 100 MG tablet MAY REPEAT IN 2 HOURS IF HEADACHE PERSISTS OR RECURS. 30 tablet 3  ? tiZANidine (ZANAFLEX) 4 MG capsule Take 1 capsule (4 mg total) by mouth 3 (three) times daily as needed for muscle spasms. 30 capsule 3  ? topiramate (TOPAMAX) 100 MG tablet TAKE 1 TABLET BY MOUTH TWICE A DAY 180 tablet 0  ? ?No current facility-administered medications on file  prior to visit.  ?  ? ?Review of Systems  ?Constitutional:  Negative for activity change, appetite change, fatigue, fever and unexpected weight change.  ?HENT:  Negative for congestion, ear pain, rhinorrhea, sinus pressure and sore throat.   ?Eyes:  Negative for pain, redness and visual disturbance.  ?Respiratory:  Negative for cough, shortness of breath and wheezing.   ?Cardiovascular:  Negative for chest pain and palpitations.  ?Gastrointestinal:  Positive for abdominal distention, abdominal pain, diarrhea and nausea. Negative for anal bleeding, blood  in stool, constipation, rectal pain and vomiting.  ?Endocrine: Negative for polydipsia and polyuria.  ?Genitourinary:  Negative for dysuria, frequency and urgency.  ?Musculoskeletal:  Negative for arthralgias, back pain and myalgias.  ?Skin:  Negative for pallor and rash.  ?Allergic/Immunologic: Negative for environmental allergies.  ?Neurological:  Negative for dizziness, syncope and headaches.  ?Hematological:  Negative for adenopathy. Does not bruise/bleed easily.  ?Psychiatric/Behavioral:  Positive for decreased concentration and dysphoric mood. The patient is nervous/anxious.   ? ?   ?Objective:  ? Physical Exam ?Constitutional:   ?   General: She is not in acute distress. ?   Appearance: She is well-developed. She is obese. She is not ill-appearing or diaphoretic.  ?HENT:  ?   Head: Normocephalic and atraumatic.  ?Eyes:  ?   General: No scleral icterus. ?   Conjunctiva/sclera: Conjunctivae normal.  ?   Pupils: Pupils are equal, round, and reactive to light.  ?Cardiovascular:  ?   Rate and Rhythm: Normal rate and regular rhythm.  ?   Heart sounds: Normal heart sounds.  ?Pulmonary:  ?   Effort: Pulmonary effort is normal. No respiratory distress.  ?   Breath sounds: Normal breath sounds. No wheezing or rales.  ?Abdominal:  ?   General: Abdomen is protuberant. Bowel sounds are normal. There is no distension or abdominal bruit.  ?   Palpations: Abdomen is soft.  There is no hepatomegaly, splenomegaly or mass.  ?   Tenderness: There is abdominal tenderness in the right upper quadrant, epigastric area and periumbilical area. There is no right CVA tenderness, left CVA t

## 2021-06-26 NOTE — Patient Instructions (Addendum)
Citrucel over the counter (fiber) can help diarrhea in IBS ? ?If you want to try a probiotic - align is a good brand  ? ?Lab today  ?I will order an abdominal ultrasound  ? ?Keep a diet journal  ?Avoid fried food  ?Avoid processed food and dairy when you can  ? ?For heartburn change from omeprazole to pepcid 20 mg twice daily  ? ?Get me your ADD testing to review ? ?Here are some locations in [place] for Counseling:  ? ? ?-[facility]  [phone]  ?   *Loss and Grief, Life transitions, College/Graduate Students... all varieties of services in addition*  ? ?-[facility]  [phone]  ?   *Individual Counseling, Family, Marriage... EMDR, Addiction, ADHD, OCD, PTSD*  ? ?-[facility]  [phone]  (Psychiatry)  ?   *Depression, Anxiety, ADHD, Substance Abuse, Bipolar Disorder, Psychotherapy*  ? ?-[facility]  [phone]  ?   *Individual Counseling, Addiction and Alcoholism Counseling*  ? ?-[facility] [phone]  ? ?-[facility] [phone]  ?   *Individual, Adolescent, Family Counseling*  ? ?-[facility] [phone]  ?   *Individual Counseling, Addiction Counseling*  ? ? ? ? ? ? ?

## 2021-06-26 NOTE — Assessment & Plan Note (Addendum)
This may add to problems with concentration  ?Pt will try to get results of ADD testing to review  ? ?Given list of counseling providers in [place] to call  ? ?

## 2021-06-26 NOTE — Assessment & Plan Note (Signed)
Omeprazole helps but may add to bloating and gas  ?Px pepcid 20 mg bid to try  ?Plans to keep a diet journal ?

## 2021-06-27 LAB — HEPATIC FUNCTION PANEL
ALT: 12 U/L (ref 0–35)
AST: 14 U/L (ref 0–37)
Albumin: 4.7 g/dL (ref 3.5–5.2)
Alkaline Phosphatase: 52 U/L (ref 39–117)
Bilirubin, Direct: 0.1 mg/dL (ref 0.0–0.3)
Total Bilirubin: 0.5 mg/dL (ref 0.2–1.2)
Total Protein: 6.8 g/dL (ref 6.0–8.3)

## 2021-06-27 LAB — CBC WITH DIFFERENTIAL/PLATELET
Basophils Absolute: 0.1 10*3/uL (ref 0.0–0.1)
Basophils Relative: 1 % (ref 0.0–3.0)
Eosinophils Absolute: 0.2 10*3/uL (ref 0.0–0.7)
Eosinophils Relative: 2.7 % (ref 0.0–5.0)
HCT: 37.5 % (ref 36.0–46.0)
Hemoglobin: 13.2 g/dL (ref 12.0–15.0)
Lymphocytes Relative: 28 % (ref 12.0–46.0)
Lymphs Abs: 1.9 10*3/uL (ref 0.7–4.0)
MCHC: 35.1 g/dL (ref 30.0–36.0)
MCV: 87.1 fl (ref 78.0–100.0)
Monocytes Absolute: 0.5 10*3/uL (ref 0.1–1.0)
Monocytes Relative: 7.3 % (ref 3.0–12.0)
Neutro Abs: 4.2 10*3/uL (ref 1.4–7.7)
Neutrophils Relative %: 61 % (ref 43.0–77.0)
Platelets: 189 10*3/uL (ref 150.0–400.0)
RBC: 4.3 Mil/uL (ref 3.87–5.11)
RDW: 13.2 % (ref 11.5–15.5)
WBC: 6.9 10*3/uL (ref 4.0–10.5)

## 2021-06-27 LAB — BASIC METABOLIC PANEL
BUN: 12 mg/dL (ref 6–23)
CO2: 25 mEq/L (ref 19–32)
Calcium: 8.9 mg/dL (ref 8.4–10.5)
Chloride: 106 mEq/L (ref 96–112)
Creatinine, Ser: 0.89 mg/dL (ref 0.40–1.20)
GFR: 82.22 mL/min (ref >60.00)
Glucose, Bld: 84 mg/dL (ref 70–99)
Potassium: 3.8 mEq/L (ref 3.5–5.1)
Sodium: 138 mEq/L (ref 135–145)

## 2021-06-27 LAB — AMYLASE: Amylase: 37 U/L (ref 27–131)

## 2021-06-27 LAB — TSH: TSH: 1.57 u[IU]/mL (ref 0.35–5.50)

## 2021-07-03 ENCOUNTER — Other Ambulatory Visit: Payer: BC Managed Care – PPO

## 2021-07-04 ENCOUNTER — Ambulatory Visit
Admission: RE | Admit: 2021-07-04 | Discharge: 2021-07-04 | Disposition: A | Discharge status: Home / Self Care | Payer: BC Managed Care – PPO | Source: Ambulatory Visit | Attending: Family Medicine | Admitting: Family Medicine

## 2021-07-04 DIAGNOSIS — R1011 Right upper quadrant pain: Secondary | ICD-10-CM

## 2021-07-05 ENCOUNTER — Other Ambulatory Visit: Payer: Self-pay | Admitting: Family Medicine

## 2021-07-05 DIAGNOSIS — G43709 Chronic migraine without aura, not intractable, without status migrainosus: Secondary | ICD-10-CM

## 2021-07-05 NOTE — Telephone Encounter (Signed)
Last OV was GI issues on 06/26/21, last filled on 03/11/21 #180 tabs with 0 refills ? ?

## 2021-07-07 ENCOUNTER — Telehealth: Payer: Self-pay | Admitting: Family Medicine

## 2021-07-07 DIAGNOSIS — R12 Heartburn: Secondary | ICD-10-CM

## 2021-07-07 DIAGNOSIS — R140 Abdominal distension (gaseous): Secondary | ICD-10-CM

## 2021-07-07 DIAGNOSIS — R110 Nausea: Secondary | ICD-10-CM | POA: Insufficient documentation

## 2021-07-07 NOTE — Telephone Encounter (Signed)
I sent the referral  ?Please give her the # for [facility] ?Thanks  ?

## 2021-07-07 NOTE — Telephone Encounter (Signed)
-----   Message from Tammi Sou, [place] sent at 07/05/2021  4:17 PM EDT ----- ?Pt notified of Dr.Charliene Inoue's comments on [place]. Pt said sxs are still there and she is still having nausea, upper stomach pain, discomfort, gas and bad heart Burn, pt does want to proceed with GI referral she would like to see someone in [place] ?

## 2021-07-08 ENCOUNTER — Telehealth: Payer: Self-pay

## 2021-07-08 NOTE — Telephone Encounter (Signed)
Scheduled for in person ?

## 2021-07-08 NOTE — Telephone Encounter (Signed)
Patient notified as instructed by telephone and verbalized understanding.Telephone number given to patient and she wrote it down. ?

## 2021-07-09 ENCOUNTER — Ambulatory Visit (INDEPENDENT_AMBULATORY_CARE_PROVIDER_SITE_OTHER): Payer: BC Managed Care – PPO | Admitting: Family

## 2021-07-09 ENCOUNTER — Other Ambulatory Visit: Payer: Self-pay | Admitting: Family Medicine

## 2021-07-09 ENCOUNTER — Encounter: Payer: Self-pay | Admitting: Family

## 2021-07-09 VITALS — BP 98/70 | HR 78 | Temp 98.3°F | Resp 16 | Ht 72.25 in | Wt 256.3 lb

## 2021-07-09 DIAGNOSIS — J039 Acute tonsillitis, unspecified: Secondary | ICD-10-CM | POA: Diagnosis not present

## 2021-07-09 DIAGNOSIS — R590 Localized enlarged lymph nodes: Secondary | ICD-10-CM

## 2021-07-09 DIAGNOSIS — M549 Dorsalgia, unspecified: Secondary | ICD-10-CM

## 2021-07-09 DIAGNOSIS — J029 Acute pharyngitis, unspecified: Secondary | ICD-10-CM | POA: Diagnosis not present

## 2021-07-09 DIAGNOSIS — M25511 Pain in right shoulder: Secondary | ICD-10-CM

## 2021-07-09 DIAGNOSIS — M542 Cervicalgia: Secondary | ICD-10-CM

## 2021-07-09 LAB — MONONUCLEOSIS SCREEN: Mono Screen: NEGATIVE

## 2021-07-09 LAB — POCT RAPID STREP A (OFFICE): Rapid Strep A Screen: NEGATIVE

## 2021-07-09 MED ORDER — AMOXICILLIN 875 MG PO TABS: 875.0000 mg | ORAL_TABLET | Freq: Two times a day (BID) | ORAL | 0 refills | Status: AC

## 2021-07-09 MED ORDER — METHYLPREDNISOLONE 4 MG PO TBPK: ORAL_TABLET | ORAL | 0 refills | Status: DC

## 2021-07-09 NOTE — Assessment & Plan Note (Signed)
Ordering mono screen as well as pt with h/o mono .  ?

## 2021-07-09 NOTE — Telephone Encounter (Signed)
Last filled on 04/16/20 #30 caps with 3 refill, last OV was PCP was on 06/26/21 for GI problems ?

## 2021-07-09 NOTE — Assessment & Plan Note (Signed)
Choosing to treat as son with positive strep ?rx amox 500 mg bid x 10 days ?Medrol dose pack 4 mg rx sent as well for throat swelling/tenderness  ?Ibuprofen/tyelnol prn sore throat/fever ?Pt told to F/u if no improvement in the next 2-3 days. ? ?

## 2021-07-09 NOTE — Patient Instructions (Addendum)
Stop by the lab prior to leaving today. I will notify you of your results once received.  ? ?Start antibiotic.  ? ?Pt to change toothbrush after 24 hours on antbx ?Warm salt water gargles/ ?Ibuprofen/tylenol prn ? ?It was a pleasure seeing you today! Please do not hesitate to reach out with any questions and or concerns. ? ?Regards,  ? ?Gaius Ishaq ?FNP-C ? ?

## 2021-07-09 NOTE — Progress Notes (Signed)
? ?Established Patient Office Visit ? ?Subjective:  ?Patient ID: Caitlyn Harris, female    DOB: 1980-08-05  Age: 41 y.o. MRN: [id number] ? ?CC:  ?Chief Complaint  ?Patient presents with  ? Sore Throat  ?  X 3 days  ? Cough  ? ? ?HPI ?ERMALEE MEALY is here today with concerns.  ? ?Three days ago started with sore throat. Even turning her neck is irritation. Bli ears with fullness/discomfort. She is coughing productive, with nasal drainage pnd and nasal congestion. Throat sore and swollen feeling on lymph nodes.  ? ?Son recently diagnosed with strep as well, treated currently.  ? ?Pt does state has had mono in the past. ? ?Past Medical History:  ?Diagnosis Date  ? Anxiety   ? Depression   ? Endometriosis   ? GERD (gastroesophageal reflux disease)   ? OCC  ? History of PCOS   ? Hyperlipidemia   ? Migraine   ? Seizures ([place])   ? FEBRILE AS A BABY  ? ? ?Past Surgical History:  ?Procedure Laterality Date  ? ABDOMINAL HYSTERECTOMY    ? CERVICAL BIOPSY  W/ LOOP ELECTRODE EXCISION    ? ECTOPIC PREGNANCY SURGERY    ? endometirosis    ? HAND SURGERY    ? THUMB SURGERY  ? LAPAROSCOPIC OVARIAN CYSTECTOMY Left 03/02/2018  ? Procedure: LAPAROSCOPIC RIGHT OVARIAN CYSTECTOMY;  Surgeon: Gae Dry, MD;  Location: [facility];  Service: Gynecology;  Laterality: Left;  ? LAPAROSCOPIC OVARIAN CYSTECTOMY Left 08/30/2019  ? Procedure: LAPAROSCOPIC OVARIAN CYSTECTOMY;  Surgeon: Gae Dry, MD;  Location: [facility];  Service: Gynecology;  Laterality: Left;  ? LEEP    ? LEEP    ? PERINEOPLASTY N/A 03/02/2018  ? Procedure: PERINEORRHAPHY;  Surgeon: Gae Dry, MD;  Location: [facility];  Service: Gynecology;  Laterality: N/A;  ? RECTOCELE REPAIR N/A 03/02/2018  ? Procedure: POSTERIOR REPAIR (RECTOCELE);  Surgeon: Gae Dry, MD;  Location: [facility];  Service: Gynecology;  Laterality: N/A;  ? VENTRAL HERNIA REPAIR N/A 03/19/2017  ? Procedure: HERNIA REPAIR VENTRAL ADULT;  Surgeon: Florene Glen, MD;  Location: [facility]  [facility];  Service: General;  Laterality: N/A;  ? ? ?Family History  ?Problem Relation Age of Onset  ? Hypertension Mother   ? Diabetes Mother   ? Renal cancer Father   ? Hypertension Father   ? ? ?Social History  ? ?Socioeconomic History  ? Marital status: Married  ?  Spouse name: Not on file  ? Number of children: Not on file  ? Years of education: Not on file  ? Highest education level: Not on file  ?Occupational History  ? Not on file  ?Tobacco Use  ? Smoking status: Former  ?  Years: 7.00  ?  Types: Cigarettes  ?  Quit date: 03/12/2009  ?  Years since quitting: 12.3  ? Smokeless tobacco: Never  ? Tobacco comments:  ?  1 PACK EVERY 2 WEEKS  ?Vaping Use  ? Vaping Use: Never used  ?Substance and Sexual Activity  ? Alcohol use: Yes  ?  Comment: RARE  ? Drug use: No  ? Sexual activity: Yes  ?Other Topics Concern  ? Not on file  ?Social History Narrative  ? Right handed  ? ?Social Determinants of Health  ? ?Financial Resource Strain: Not on file  ?Food Insecurity: Not on file  ?Transportation Needs: Not on file  ?Physical Activity: Not on file  ?Stress: Not on file  ?Social Connections:  Not on file  ?Intimate Partner Violence: Not on file  ? ? ?Outpatient Medications Prior to Visit  ?Medication Sig Dispense Refill  ? acetaminophen (TYLENOL) 500 MG tablet Take 1,000 mg by mouth every 6 (six) hours as needed for moderate pain or headache.    ? citalopram (CELEXA) 40 MG tablet TAKE 1 TABLET BY MOUTH EVERYDAY AT BEDTIME 90 tablet 1  ? EMGALITY 120 MG/ML SOAJ INJECT 120 MG INTO THE SKIN EVERY 28 (TWENTY-EIGHT) DAYS. 1 mL 0  ? famotidine (PEPCID) 20 MG tablet Take 1 tablet (20 mg total) by mouth 2 (two) times daily. 60 tablet 3  ? fluticasone (FLONASE) 50 MCG/ACT nasal spray Place 2 sprays into both nostrils daily. 16 g 0  ? ondansetron (ZOFRAN-ODT) 4 MG disintegrating tablet TAKE 1 TABLET BY MOUTH EVERY 8 HOURS AS NEEDED FOR NAUSEA AND VOMITING 18 tablet 0  ? propranolol ER (INDERAL LA) 80 MG 24 hr capsule TAKE 1 CAPSULE BY  MOUTH EVERY DAY 30 capsule 0  ? SUMAtriptan (IMITREX) 100 MG tablet MAY REPEAT IN 2 HOURS IF HEADACHE PERSISTS OR RECURS. 30 tablet 3  ? tiZANidine (ZANAFLEX) 4 MG capsule Take 1 capsule (4 mg total) by mouth 3 (three) times daily as needed for muscle spasms. 30 capsule 3  ? topiramate (TOPAMAX) 100 MG tablet TAKE 1 TABLET BY MOUTH TWICE A DAY 180 tablet 1  ? ?No facility-administered medications prior to visit.  ? ? ?Allergies  ?Allergen Reactions  ? Tape   ?  Low sensitivity to tape, ok with Tegaderm and paper tape   ? ? ?ROS ?Review of Systems  ?Constitutional:  Negative for chills and fever.  ?HENT:  Positive for congestion, ear pain (bil ear discomfort and fullness), postnasal drip, rhinorrhea and sore throat. Negative for sinus pressure.   ?Respiratory:  Negative for cough, shortness of breath and wheezing.   ?Cardiovascular:  Negative for chest pain and palpitations.  ? ?  ?Objective:  ?  ?Physical Exam ?Vitals reviewed.  ?Constitutional:   ?   General: She is not in acute distress. ?   Appearance: Normal appearance. She is obese. She is not ill-appearing, toxic-appearing or diaphoretic.  ?HENT:  ?   Head: Normocephalic.  ?   Right Ear: Tympanic membrane normal.  ?   Left Ear: Tympanic membrane normal.  ?   Nose: Nose normal.  ?   Mouth/Throat:  ?   Mouth: Mucous membranes are moist.  ?   Pharynx: Posterior oropharyngeal erythema present. No pharyngeal swelling or uvula swelling.  ?   Tonsils: Tonsillar exudate present. No tonsillar abscesses. 2+ on the right. 2+ on the left.  ?Eyes:  ?   Pupils: Pupils are equal, round, and reactive to light.  ?Cardiovascular:  ?   Rate and Rhythm: Normal rate and regular rhythm.  ?Pulmonary:  ?   Effort: Pulmonary effort is normal.  ?   Breath sounds: Normal breath sounds.  ?Musculoskeletal:  ?   Cervical back: Normal range of motion.  ?Neurological:  ?   Mental Status: She is alert.  ? ? ?BP 98/70   Pulse 78   Temp 98.3 ?F (36.8 ?C)   Resp 16   Ht 6' 0.25" (1.835 m)    Wt 256 lb 5 oz (116.3 kg)   SpO2 97%   BMI 34.52 kg/m?  ?Wt Readings from Last 3 Encounters:  ?07/09/21 256 lb 5 oz (116.3 kg)  ?06/26/21 257 lb (116.6 kg)  ?02/06/21 252 lb (114.3 kg)  ? ? ? ?  Health Maintenance Due  ?Topic Date Due  ? HIV Screening  Never done  ? Hepatitis C Screening  Never done  ? COVID-19 Vaccine (3 - Booster for Pfizer series) 09/06/2019  ? ? ?There are no preventive care reminders to display for this patient. ? ?Lab Results  ?Component Value Date  ? TSH 1.57 06/26/2021  ? ?Lab Results  ?Component Value Date  ? WBC 6.9 06/26/2021  ? HGB 13.2 06/26/2021  ? HCT 37.5 06/26/2021  ? MCV 87.1 06/26/2021  ? PLT 189.0 06/26/2021  ? ?Lab Results  ?Component Value Date  ? NA 138 06/26/2021  ? K 3.8 06/26/2021  ? CO2 25 06/26/2021  ? GLUCOSE 84 06/26/2021  ? BUN 12 06/26/2021  ? CREATININE 0.89 06/26/2021  ? BILITOT 0.5 06/26/2021  ? ALKPHOS 52 06/26/2021  ? AST 14 06/26/2021  ? ALT 12 06/26/2021  ? PROT 6.8 06/26/2021  ? ALBUMIN 4.7 06/26/2021  ? CALCIUM 8.9 06/26/2021  ? ANIONGAP 8 07/25/2019  ? GFR 82.22 06/26/2021  ? ?Lab Results  ?Component Value Date  ? HGBA1C 4.7 08/17/2017  ? ? ?  ?Assessment & Plan:  ? ?Problem List Items Addressed This Visit   ? ?  ? Respiratory  ? Tonsillitis with exudate  ?  Choosing to treat as son with positive strep ?rx amox 500 mg bid x 10 days ?Medrol dose pack 4 mg rx sent as well for throat swelling/tenderness  ?Ibuprofen/tyelnol prn sore throat/fever ?Pt told to F/u if no improvement in the next 2-3 days. ? ?  ?  ?  ? Immune and Lymphatic  ? Cervical lymphadenopathy  ?  Ordering mono screen as well as pt with h/o mono .  ?  ?  ? Relevant Orders  ? Mononucleosis screen  ?  ? Other  ? Sore throat - Primary  ?  Rapid strep in office negative ?Throat culture ordered pending results ?However son positive for strep and since symptomatic choosing to treat. ?  ?  ? Relevant Medications  ? methylPREDNISolone (MEDROL DOSEPAK) 4 MG TBPK tablet  ? amoxicillin (AMOXIL) 875 MG  tablet  ? Other Relevant Orders  ? POCT rapid strep A (Completed)  ? Culture, Group A Strep  ? Mononucleosis screen  ? ? ?Meds ordered this encounter  ?Medications  ? methylPREDNISolone (MEDROL DOSEPAK) 4 MG TBPK

## 2021-07-09 NOTE — Progress Notes (Signed)
? ?Virtual Visit via Video Note ?The purpose of this virtual visit is to provide medical care while limiting exposure to the novel coronavirus.   ? ?Consent was obtained for video visit:  Yes.   ?Answered questions that patient had about telehealth interaction:  Yes.   ?I discussed the limitations, risks, security and privacy concerns of performing an evaluation and management service by telemedicine. I also discussed with the patient that there may be a patient responsible charge related to this service. The patient expressed understanding and agreed to proceed. ? ?Pt location: Home ?Physician Location: office ?Name of referring provider:  Tower, Wynelle Fanny, MD ?I connected with Caitlyn Harris at patients initiation/request on 07/10/2021 at  2:30 PM EDT by video enabled telemedicine application and verified that I am speaking with the correct person using two identifiers. ?Pt MRN:  [id number] ?Pt DOB:  1980/11/21 ?Video Participants:  Caitlyn Harris ? ?Assessment/Plan:  ? ?Chronic migraine with and without aura - worse over the past 3-4 months.  As she has had over 15 days a month of migraine for at least 3 consecutive months and has failed Emgality, topiramate, propranolol and amitriptyline, she would be an ideal candidate for Botox. ? ?  ?Migraine prevention:  Will try to start Botox.  Continue topiramate '100mg'$  BID and Emgality.  Discontinue propranolol ?Migraine rescue:  sumatriptan '100mg'$ , tizanidine, ibuprofen/acetaminophen ?Limit use of pain relievers to no more than 2 days out of week to prevent risk of rebound or medication-overuse headache. ?Keep headache diary ?Follow up for Botox. ?  ?  ?Subjective:  ?Caitlyn Harris is a 41 year old right-handed female with depression, anxiety and history of infantile febrile seizures who follows up for migraine. ?  ?UPDATE: ?Switched from topiramate to propranolol.  She remained on topiramate because she started experiencing increased headaches as she tried  tapering off.  Unfortunately, her pharmacy isn't getting the Emgality in stock on time.  Reports increased migraine frequency related to stress and weather and possibly hormonal.   ?Intensity: 10/10 ?Duration:  2-4 hours ?Frequency:  15 days a month ?She has a dull headache everyday ("sinus headache" or tension-type headache) - usually 3-5/10  ?Rescue therapy:  sumatriptan and usually tizanidine with ibuprofen or acetaminophen with a Coke ?  ?Medication frequency:  Uses up all sumatriptan each month.  Takes Tylenol or ibuprofen one to two days a week ?Current NSAIDS/analgesics:  acetaminophen ?Current triptans:  Sumatriptan '100mg'$  ?Current ergotamine:  none ?Current anti-emetic:  Zofran ODT ?Current muscle relaxants:  Tizanidine '4mg'$  TID PRN (has not been taking because no refills) ?Current Antihypertensive medications:  propranolol ER '80mg'$  ?Current Antidepressant medications:  Citalopram '40mg'$  ?Current Anticonvulsant medications:  topiramate '100mg'$  twice daily ?Current anti-CGRP:  Emgality Q28d ?Current Vitamins/Herbal/Supplements:  none ?Current Antihistamines/Decongestants:  Sudafed (for sinus headache) ?Other therapy:  none ?Hormone/birth control:  none ?  ?Caffeine:  1 Coke twice a week.  No coffee ?Diet:  Mostly water.  Tries not to skip meals ?Exercise:  no ?Depression:  yes; Anxiety:  yes ?Other pain:  Mild back pain at times ?Sleep:  Poor - sleep study - wakes up frequently ?  ?HISTORY:  ?Headaches since middle school.   ?Migraines are severe stabbing or throbbing, from back of neck and occipital region, either temple, with nausea, photophobia, phonophobia, osmophobia, diarrhea, numbness down the arm of side of headache, sees sparkles in vision.  When severe, she becomes irritable with rage.  Usually lasts 2 hours to 2 days (has lasted 5 days).  Occurs at least 4 to 6 days a month.  Postdrome with fatigue, scalp soreness, photosensitivity.  Triggers include bananas, cilantro, dark chocolate, too much/too little  sleep, skipping meals, perfumes, bright lights, cigarette smoke.  Rubbing or applying pressure to area of headache helpful.   ?  ?Also has daily tension-type headache, moderate nonthrobbing frontal pressure. ?  ?  ?Past NSAIDS/analgesics:  Toradol ?Past abortive triptans:  none ?Past abortive ergotamine:  none ?Past muscle relaxants:  Flexeril ?Past anti-emetic:  Zofran ?Past antihypertensive medications:  Beta blocker ?Past antidepressant medications: Amitriptyline, Wellbutrin ?Past anticonvulsant medications: gabapentin ?Past anti-CGRP:  Nurtec (rescue - ineffective) ?Past vitamins/Herbal/Supplements:  none ?Past antihistamines/decongestants:  none ?Other past therapies:  O2 (made headaches worse) ?  ?  ?Family history of migraines:  Maternal grandmother, father, sister, brother, niece ? ?Past Medical History: ?Past Medical History:  ?Diagnosis Date  ? Anxiety   ? Depression   ? Endometriosis   ? GERD (gastroesophageal reflux disease)   ? OCC  ? History of PCOS   ? Hyperlipidemia   ? Migraine   ? Seizures ([place])   ? FEBRILE AS A BABY  ? ? ?Medications: ?Outpatient Encounter Medications as of 07/10/2021  ?Medication Sig  ? acetaminophen (TYLENOL) 500 MG tablet Take 1,000 mg by mouth every 6 (six) hours as needed for moderate pain or headache.  ? amoxicillin (AMOXIL) 875 MG tablet Take 1 tablet (875 mg total) by mouth 2 (two) times daily for 10 days.  ? citalopram (CELEXA) 40 MG tablet TAKE 1 TABLET BY MOUTH EVERYDAY AT BEDTIME  ? EMGALITY 120 MG/ML SOAJ INJECT 120 MG INTO THE SKIN EVERY 28 (TWENTY-EIGHT) DAYS.  ? famotidine (PEPCID) 20 MG tablet Take 1 tablet (20 mg total) by mouth 2 (two) times daily.  ? fluticasone (FLONASE) 50 MCG/ACT nasal spray Place 2 sprays into both nostrils daily.  ? methylPREDNISolone (MEDROL DOSEPAK) 4 MG TBPK tablet Take per package instructions  ? ondansetron (ZOFRAN-ODT) 4 MG disintegrating tablet TAKE 1 TABLET BY MOUTH EVERY 8 HOURS AS NEEDED FOR NAUSEA AND VOMITING  ? propranolol ER  (INDERAL LA) 80 MG 24 hr capsule TAKE 1 CAPSULE BY MOUTH EVERY DAY  ? SUMAtriptan (IMITREX) 100 MG tablet MAY REPEAT IN 2 HOURS IF HEADACHE PERSISTS OR RECURS.  ? tiZANidine (ZANAFLEX) 4 MG capsule TAKE 1 CAPSULE BY MOUTH 3 TIMES DAILY AS NEEDED FOR MUSCLE SPASMS.  ? topiramate (TOPAMAX) 100 MG tablet TAKE 1 TABLET BY MOUTH TWICE A DAY  ? [DISCONTINUED] tiZANidine (ZANAFLEX) 4 MG capsule Take 1 capsule (4 mg total) by mouth 3 (three) times daily as needed for muscle spasms.  ? ?No facility-administered encounter medications on file as of 07/10/2021.  ? ? ?Allergies: ?Allergies  ?Allergen Reactions  ? Tape   ?  Low sensitivity to tape, ok with Tegaderm and paper tape   ? ? ?Family History: ?Family History  ?Problem Relation Age of Onset  ? Hypertension Mother   ? Diabetes Mother   ? Renal cancer Father   ? Hypertension Father   ? ? ?Observations/Objective:   ? ?No acute distress.  Alert and oriented.  Speech fluent and not dysarthric.  Language intact.   ? ? ?Follow Up Instructions: ?  ? -I discussed the assessment and treatment plan with the patient. The patient was provided an opportunity to ask questions and all were answered. The patient agreed with the plan and demonstrated an understanding of the instructions. ?  ?The patient was advised to call back or seek  an in-person evaluation if the symptoms worsen or if the condition fails to improve as anticipated. ? ? ?Dudley Major, DO ? ?

## 2021-07-09 NOTE — Assessment & Plan Note (Signed)
Rapid strep in office negative ?Throat culture ordered pending results ?However son positive for strep and since symptomatic choosing to treat. ?

## 2021-07-10 ENCOUNTER — Other Ambulatory Visit (HOSPITAL_COMMUNITY): Payer: Self-pay

## 2021-07-10 ENCOUNTER — Encounter: Payer: Self-pay | Admitting: Neurology

## 2021-07-10 ENCOUNTER — Encounter: Payer: Self-pay | Admitting: Family Medicine

## 2021-07-10 ENCOUNTER — Telehealth (INDEPENDENT_AMBULATORY_CARE_PROVIDER_SITE_OTHER): Payer: BC Managed Care – PPO | Admitting: Neurology

## 2021-07-10 DIAGNOSIS — G43709 Chronic migraine without aura, not intractable, without status migrainosus: Secondary | ICD-10-CM

## 2021-07-10 DIAGNOSIS — F419 Anxiety disorder, unspecified: Secondary | ICD-10-CM

## 2021-07-10 DIAGNOSIS — G43109 Migraine with aura, not intractable, without status migrainosus: Secondary | ICD-10-CM

## 2021-07-10 DIAGNOSIS — R41840 Attention and concentration deficit: Secondary | ICD-10-CM

## 2021-07-11 ENCOUNTER — Telehealth: Payer: Self-pay

## 2021-07-11 ENCOUNTER — Other Ambulatory Visit (HOSPITAL_COMMUNITY): Payer: Self-pay

## 2021-07-11 DIAGNOSIS — G43709 Chronic migraine without aura, not intractable, without status migrainosus: Secondary | ICD-10-CM

## 2021-07-11 LAB — CULTURE, GROUP A STREP
MICRO NUMBER:: 13220322
SPECIMEN QUALITY:: ADEQUATE

## 2021-07-11 MED ORDER — BOTOX 200 UNITS IJ SOLR
INTRAMUSCULAR | 4 refills | Status: DC
  Filled 2021-07-11: qty 1, fill #0
  Filled 2021-07-22: qty 1, 90d supply, fill #0
  Filled 2021-11-08: qty 1, 90d supply, fill #1
  Filled 2022-04-30: qty 1, 90d supply, fill #2

## 2021-07-11 NOTE — Telephone Encounter (Signed)
Patient Advocate Encounter ? ?Prior Authorization for Botox 200units has been approved.   ? ?PA# [id number] ? ?Effective dates: 07/11/21 through 01/10/22 ? ?Per Test Claim Patients co-pay is [phone]  ? ?Patient Advocate ?Fax: [phone]  ?

## 2021-07-11 NOTE — Telephone Encounter (Signed)
Script for Botox sent to [facility]. ?Patient aware to try [email or web address] to see if she is eligible.  ?

## 2021-07-11 NOTE — Telephone Encounter (Signed)
Patient Advocate Encounter ?  ?Received notification from patient calls that prior authorization for Botox 200units is required by his/her insurance [employer]. ?  ?PA submitted on 07/11/21 ? ?Key#: [id number] ? ?Status is pending ?   ?[facility] will continue to follow: ? ?Patient Advocate ?Fax: [phone]  ?

## 2021-07-11 NOTE — Addendum Note (Signed)
Addended by: Venetia Night on: 07/11/2021 04:04 PM ? ? Modules accepted: Orders ? ?

## 2021-07-12 ENCOUNTER — Other Ambulatory Visit (HOSPITAL_COMMUNITY): Payer: Self-pay

## 2021-07-16 NOTE — Telephone Encounter (Signed)
Please see their response on the release we sent.  ? ?They stated:   "no records for request, we only keep records for 10 yrs" ? ?Faxed placed in your inbox for review  ?

## 2021-07-17 ENCOUNTER — Other Ambulatory Visit (HOSPITAL_COMMUNITY): Payer: Self-pay

## 2021-07-18 ENCOUNTER — Other Ambulatory Visit: Payer: Self-pay | Admitting: Neurology

## 2021-07-22 ENCOUNTER — Other Ambulatory Visit (HOSPITAL_COMMUNITY): Payer: Self-pay

## 2021-07-22 NOTE — Telephone Encounter (Signed)
Botox account set up. Ready for appt date so they can deliver. ? ?Message sent to Hinton Dyer to call patient to add her to [facility] 08/23/21 Botox day.  ?

## 2021-08-06 ENCOUNTER — Other Ambulatory Visit (HOSPITAL_COMMUNITY): Payer: Self-pay

## 2021-08-13 ENCOUNTER — Other Ambulatory Visit (HOSPITAL_COMMUNITY): Payer: Self-pay

## 2021-08-14 ENCOUNTER — Other Ambulatory Visit (HOSPITAL_COMMUNITY): Payer: Self-pay

## 2021-08-16 ENCOUNTER — Other Ambulatory Visit (HOSPITAL_COMMUNITY): Payer: Self-pay

## 2021-08-21 DIAGNOSIS — F419 Anxiety disorder, unspecified: Secondary | ICD-10-CM | POA: Insufficient documentation

## 2021-08-21 DIAGNOSIS — R41840 Attention and concentration deficit: Secondary | ICD-10-CM | POA: Insufficient documentation

## 2021-08-21 NOTE — Addendum Note (Signed)
Addended by: Loura Pardon A on: 08/21/2021 09:16 PM ? ? Modules accepted: Orders ? ?

## 2021-08-23 ENCOUNTER — Ambulatory Visit: Payer: BC Managed Care – PPO | Admitting: Neurology

## 2021-08-23 DIAGNOSIS — G43709 Chronic migraine without aura, not intractable, without status migrainosus: Secondary | ICD-10-CM

## 2021-08-23 MED ORDER — ONABOTULINUMTOXINA 100 UNITS IJ SOLR
200.0000 [IU] | Freq: Once | INTRAMUSCULAR | Status: AC
  Administered 2021-08-23: 155 [IU] via INTRAMUSCULAR

## 2021-08-23 NOTE — Progress Notes (Signed)
[facility]  ° °Procedure Note Botox ° °Attending: Dr. Neyla Gauntt ° °Preoperative Diagnosis(es): Chronic migraine ° °Consent obtained from: The patient °Benefits discussed included, but were not limited to decreased muscle tightness, increased joint range of motion, and decreased pain.  Risk discussed included, but were not limited pain and discomfort, bleeding, bruising, excessive weakness, venous thrombosis, muscle atrophy and dysphagia.  Anticipated outcomes of the procedure as well as he risks and benefits of the alternatives to the procedure, and the roles and tasks of the personnel to be involved, were discussed with the patient, and the patient consents to the procedure and agrees to proceed. A copy of the patient medication guide was given to the patient which explains the blackbox warning. ° °Patients identity and treatment sites confirmed Yes.  . ° °Details of Procedure: °Skin was cleaned with alcohol. Prior to injection, the needle plunger was aspirated to make sure the needle was not within a blood vessel.  There was no blood retrieved on aspiration.   ° °Following is a summary of the muscles injected  And the amount of Botulinum toxin used: ° °Dilution °200 units of Botox was reconstituted with 4 ml of preservative free normal saline. °Time of reconstitution: At the time of the office visit (<30 minutes prior to injection)  ° °Injections  °155 total units of Botox was injected with a 30 gauge needle. ° °Injection Sites: °L occipitalis: 15 units- 3 sites  °R occiptalis: 15 units- 3 sites ° °L upper trapezius: 15 units- 3 sites °R upper trapezius: 15 units- 3 sits          °L paraspinal: 10 units- 2 sites °R paraspinal: 10 units- 2 sites ° °Face °L frontalis(2 injection sites):10 units   °R frontalis(2 injection sites):10 units         °L corrugator: 5 units   °R corrugator: 5 units           °Procerus: 5 units   °L temporalis: 20 units °R temporalis: 20 units  ° °Agent:  °200 units of botulinum Type  A (Onobotulinum Toxin type A) was reconstituted with 4 ml of preservative free normal saline.  °Time of reconstitution: At the time of the office visit (<30 minutes prior to injection)  ° ° ° Total injected (Units):  155 ° Total wasted (Units):  45 ° °Patient tolerated procedure well without complications.   °Reinjection is anticipated in 3 months. ° ° °

## 2021-09-15 ENCOUNTER — Other Ambulatory Visit: Payer: Self-pay | Admitting: Family Medicine

## 2021-09-15 DIAGNOSIS — F330 Major depressive disorder, recurrent, mild: Secondary | ICD-10-CM

## 2021-09-21 ENCOUNTER — Other Ambulatory Visit: Payer: Self-pay | Admitting: Family Medicine

## 2021-10-29 ENCOUNTER — Ambulatory Visit: Payer: BC Managed Care – PPO | Admitting: Gastroenterology

## 2021-11-06 ENCOUNTER — Other Ambulatory Visit (HOSPITAL_COMMUNITY): Payer: Self-pay

## 2021-11-08 ENCOUNTER — Other Ambulatory Visit (HOSPITAL_COMMUNITY): Payer: Self-pay

## 2021-11-12 ENCOUNTER — Other Ambulatory Visit (HOSPITAL_COMMUNITY): Payer: Self-pay

## 2021-11-15 ENCOUNTER — Encounter: Payer: Self-pay | Admitting: Psychiatry

## 2021-11-15 ENCOUNTER — Ambulatory Visit: Payer: BC Managed Care – PPO | Admitting: Psychiatry

## 2021-11-15 VITALS — BP 131/89 | HR 103 | Temp 97.9°F | Ht 73.0 in | Wt 263.0 lb

## 2021-11-15 DIAGNOSIS — F332 Major depressive disorder, recurrent severe without psychotic features: Secondary | ICD-10-CM | POA: Diagnosis not present

## 2021-11-15 DIAGNOSIS — F411 Generalized anxiety disorder: Secondary | ICD-10-CM | POA: Diagnosis not present

## 2021-11-15 DIAGNOSIS — Z634 Disappearance and death of family member: Secondary | ICD-10-CM | POA: Insufficient documentation

## 2021-11-15 DIAGNOSIS — R41840 Attention and concentration deficit: Secondary | ICD-10-CM | POA: Diagnosis not present

## 2021-11-15 MED ORDER — HYDROXYZINE PAMOATE 25 MG PO CAPS: 25.0000 mg | ORAL_CAPSULE | Freq: Every evening | ORAL | 1 refills | Status: DC | PRN

## 2021-11-15 MED ORDER — VENLAFAXINE HCL ER 37.5 MG PO CP24: 37.5000 mg | ORAL_CAPSULE | Freq: Every day | ORAL | 1 refills | Status: DC

## 2021-11-15 MED ORDER — CITALOPRAM HYDROBROMIDE 10 MG PO TABS: 10.0000 mg | ORAL_TABLET | Freq: Every day | ORAL | 0 refills | Status: DC

## 2021-11-15 NOTE — Patient Instructions (Signed)
[email or web address]  [email or web address]   [facility] - [place] [phone]  [facility] - [phone] [address], [place], [place] [address]  [phone]  Hydroxyzine Capsules or Tablets What is this medication? HYDROXYZINE (hye Cando i zeen) treats the symptoms of allergies and allergic reactions. It may also be used to treat anxiety or cause drowsiness before a procedure. It works by blocking histamine, a substance released by the body during an allergic reaction. It belongs to a group of medications called antihistamines. This medicine may be used for other purposes; ask your health care provider or pharmacist if you have questions. COMMON BRAND NAME(S): ANX, Atarax, Rezine, Vistaril What should I tell my care team before I take this medication? They need to know if you have any of these conditions: Glaucoma Heart disease History of irregular heartbeat Kidney disease Liver disease Lung or breathing disease, like asthma Stomach or intestine problems Thyroid disease Trouble passing urine An unusual or allergic reaction to hydroxyzine, cetirizine, other medications, foods, dyes or preservatives Pregnant or trying to get pregnant Breast-feeding How should I use this medication? Take this medication by mouth with a full glass of water. Follow the directions on the prescription label. You may take this medication with food or on an empty stomach. Take your medication at regular intervals. Do not take your medication more often than directed. Talk to your care team regarding the use of this medication in children. Special care may be needed. While this medication may be prescribed for children as young as 51 years of age for selected conditions, precautions do apply. Patients over 58 years old may have a stronger reaction and need a smaller dose. Overdosage: If you think you have taken too much  of this medicine contact a poison control center or emergency room at once. NOTE: This medicine is only for you. Do not share this medicine with others. What if I miss a dose? If you miss a dose, take it as soon as you can. If it is almost time for your next dose, take only that dose. Do not take double or extra doses. What may interact with this medication? Do not take this medication with any of the following: Cisapride Dronedarone Pimozide Thioridazine This medication may also interact with the following: Alcohol Antihistamines for allergy, cough, and cold Atropine Barbiturate medications for sleep or seizures, like phenobarbital Certain antibiotics like erythromycin or clarithromycin Certain medications for anxiety or sleep Certain medications for bladder problems like oxybutynin, tolterodine Certain medications for depression or psychotic disturbances Certain medications for irregular heart beat Certain medications for Parkinson's disease like benztropine, trihexyphenidyl Certain medications for seizures like phenobarbital, primidone Certain medications for stomach problems like dicyclomine, hyoscyamine Certain medications for travel sickness like scopolamine Ipratropium Narcotic medications for pain Other medications that prolong the QT interval (which can cause an abnormal heart rhythm) like dofetilide This list may not describe all possible interactions. Give your health care provider a list of all the medicines, herbs, non-prescription drugs, or dietary supplements you use. Also tell them if you smoke, drink alcohol, or use illegal drugs. Some items may interact with your medicine. What should I watch for while using this medication? Tell your care team if your symptoms do not improve. You may get drowsy or dizzy. Do not drive, use machinery, or do anything that needs mental alertness until you know how this medication affects you. Do  not stand or sit up quickly, especially if  you are an older patient. This reduces the risk of dizzy or fainting spells. Alcohol may interfere with the effect of this medication. Avoid alcoholic drinks. Your mouth may get dry. Chewing sugarless gum or sucking hard candy, and drinking plenty of water may help. Contact your care team if the problem does not go away or is severe. This medication may cause dry eyes and blurred vision. If you wear contact lenses you may feel some discomfort. Lubricating drops may help. See your eye care specialist if the problem does not go away or is severe. If you are receiving skin tests for allergies, tell your care team you are using this medication. What side effects may I notice from receiving this medication? Side effects that you should report to your care team as soon as possible: Allergic reactions--skin rash, itching, hives, swelling of the face, lips, tongue, or throat Heart rhythm changes--fast or irregular heartbeat, dizziness, feeling faint or lightheaded, chest pain, trouble breathing Side effects that usually do not require medical attention (report to your care team if they continue or are bothersome): Confusion Drowsiness Dry mouth Hallucinations Headache This list may not describe all possible side effects. Call your doctor for medical advice about side effects. You may report side effects to FDA at 1-800-FDA-1088. Where should I keep my medication? Keep out of the reach of children and pets. Store at room temperature between 15 and 30 degrees C (59 and 86 degrees F). Keep container tightly closed. Throw away any unused medication after the expiration date. NOTE: This sheet is a summary. It may not cover all possible information. If you have questions about this medicine, talk to your doctor, pharmacist, or health care provider.  2023 Elsevier/Gold Standard (2020-04-26 00:00:00) Venlafaxine Extended-Release Capsules What is this medication?    VENLAFAXINE (VEN la fax een) treats  depression and anxiety. It increases the amount of serotonin and norepinephrine in the brain, hormones that help regulate mood. It belongs to a group of medications called SNRIs. This medicine may be used for other purposes; ask your health care provider or pharmacist if you have questions. COMMON BRAND NAME(S): Effexor XR What should I tell my care team before I take this medication? They need to know if you have any of these conditions: Bleeding disorders Glaucoma Heart disease High blood pressure High cholesterol Kidney disease Liver disease Low levels of sodium in the blood Mania or bipolar disorder Seizures Suicidal thoughts, plans, or attempt; a previous suicide attempt by you or a family Take medications that treat or prevent blood clots Thyroid disease An unusual or allergic reaction to venlafaxine, desvenlafaxine, other medications, foods, dyes, or preservatives Pregnant or trying to get pregnant Breast-feeding How should I use this medication? Take this medication by mouth with a full glass of water. Follow the directions on the prescription label. Do not cut, crush, or chew this medication. Take it with food. If needed, the capsule may be carefully opened and the entire contents sprinkled on a spoonful of cool applesauce. Swallow the applesauce/pellet mixture right away without chewing and follow with a glass of water to ensure complete swallowing of the pellets. Try to take your medication at about the same time each day. Do not take your medication more often than directed. Do not stop taking this medication suddenly except upon the advice of your care team. Stopping this medication too quickly may cause serious side effects or your condition may worsen. A special MedGuide  will be given to you by the pharmacist with each prescription and refill. Be sure to read this information carefully each time. Talk to your care team regarding the use of this medication in children. Special  care may be needed. Overdosage: If you think you have taken too much of this medicine contact a poison control center or emergency room at once. NOTE: This medicine is only for you. Do not share this medicine with others. What if I miss a dose? If you miss a dose, take it as soon as you can. If it is almost time for your next dose, take only that dose. Do not take double or extra doses. What may interact with this medication? Do not take this medication with any of the following: Certain medications for fungal infections like fluconazole, itraconazole, ketoconazole, posaconazole, voriconazole Cisapride Desvenlafaxine Dronedarone Duloxetine Levomilnacipran Linezolid MAOIs like Carbex, Eldepryl, Marplan, Nardil, and Parnate Methylene blue (injected into a vein) Milnacipran Pimozide Thioridazine This medication may also interact with the following: Amphetamines Aspirin and aspirin-like medications Certain medications for depression, anxiety, or psychotic disturbances Certain medications for migraine headaches like almotriptan, eletriptan, frovatriptan, naratriptan, rizatriptan, sumatriptan, zolmitriptan Certain medications for sleep Certain medications that treat or prevent blood clots like dalteparin, enoxaparin, warfarin Cimetidine Clozapine Diuretics Fentanyl Furazolidone Indinavir Isoniazid Lithium Metoprolol NSAIDS, medications for pain and inflammation, like ibuprofen or naproxen Other medications that prolong the QT interval (cause an abnormal heart rhythm) like dofetilide, ziprasidone Procarbazine Rasagiline Supplements like St. John's wort, kava kava, valerian Tramadol Tryptophan This list may not describe all possible interactions. Give your health care provider a list of all the medicines, herbs, non-prescription drugs, or dietary supplements you use. Also tell them if you smoke, drink alcohol, or use illegal drugs. Some items may interact with your medicine. What  should I watch for while using this medication? Tell your care team if your symptoms do not get better or if they get worse. Visit your care team for regular checks on your progress. Because it may take several weeks to see the full effects of this medication, it is important to continue your treatment as prescribed by your care team. Watch for new or worsening thoughts of suicide or depression. This includes sudden changes in mood, behaviors, or thoughts. These changes can happen at any time but are more common in the beginning of treatment or after a change in dose. Call your care team right away if you experience these thoughts or worsening depression. Manic episodes may happen in patients with bipolar disorder who take this medication. Watch for changes in feelings or behaviors such as feeling anxious, nervous, agitated, panicky, irritable, hostile, aggressive, impulsive, severely restless, overly excited and hyperactive, or trouble sleeping. These changes can happen at any time but are more common in the beginning of treatment or after a change in dose. Call your care team right away if you notice any of these symptoms. This medication can cause an increase in blood pressure. Check with your care team for instructions on monitoring your blood pressure while taking this medication. You may get drowsy or dizzy. Do not drive, use machinery, or do anything that needs mental alertness until you know how this medication affects you. Do not stand or sit up quickly, especially if you are an older patient. This reduces the risk of dizzy or fainting spells. Do not drink alcohol while taking this medication. Drinking alcohol may alter the effects of your medication. Serious side effects may occur. Your mouth may get  dry. Chewing sugarless gum, sucking hard candy and drinking plenty of water will help. Contact your care team if the problem does not go away or is severe. What side effects may I notice from receiving  this medication? Side effects that you should report to your care team as soon as possible: Allergic reactions--skin rash, itching, hives, swelling of the face, lips, tongue, or throat Bleeding--bloody or black, tar-like stools, red or dark brown urine, vomiting blood or brown material that looks like coffee grounds, small, red or purple spots on skin, unusual bleeding or bruising Heart rhythm changes--fast or irregular heartbeat, dizziness, feeling faint or lightheaded, chest pain, trouble breathing Increase in blood pressure Loss of appetite with weight loss Low sodium level--muscle weakness, fatigue, dizziness, headache, confusion Serotonin syndrome--irritability, confusion, fast or irregular heartbeat, muscle stiffness, twitching muscles, sweating, high fever, seizures, chills, vomiting, diarrhea Sudden eye pain or change in vision such as blurry vision, seeing halos around lights, vision loss Thoughts of suicide or self-harm, worsening mood, feelings of depression Side effects that usually do not require medical attention (report to your care team if they continue or are bothersome): Anxiety, nervousness Change in sex drive or performance Dizziness Dry mouth Excessive sweating Nausea Tremors or shaking Trouble sleeping This list may not describe all possible side effects. Call your doctor for medical advice about side effects. You may report side effects to FDA at 1-800-FDA-1088. Where should I keep my medication? Keep out of the reach of children and pets. Store at a controlled temperature between 20 and 25 degrees C (68 degrees and 77 degrees F), in a dry place. Throw away any unused medication after the expiration date. NOTE: This sheet is a summary. It may not cover all possible information. If you have questions about this medicine, talk to your doctor, pharmacist, or health care provider.  2023 Elsevier/Gold Standard (2020-03-14 00:00:00)
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
## 2021-11-18 ENCOUNTER — Other Ambulatory Visit (HOSPITAL_COMMUNITY): Payer: Self-pay

## 2021-11-22 ENCOUNTER — Ambulatory Visit: Payer: BC Managed Care – PPO | Admitting: Neurology

## 2021-11-24 ENCOUNTER — Encounter: Payer: Self-pay | Admitting: Emergency Medicine

## 2021-11-24 ENCOUNTER — Emergency Department: Payer: BC Managed Care – PPO

## 2021-11-24 ENCOUNTER — Other Ambulatory Visit: Payer: Self-pay

## 2021-11-24 ENCOUNTER — Emergency Department
Admission: EM | Admit: 2021-11-24 | Discharge: 2021-11-24 | Disposition: A | Discharge status: Home / Self Care | Payer: BC Managed Care – PPO | Attending: Emergency Medicine | Admitting: Emergency Medicine

## 2021-11-24 DIAGNOSIS — R1084 Generalized abdominal pain: Secondary | ICD-10-CM | POA: Diagnosis not present

## 2021-11-24 DIAGNOSIS — K625 Hemorrhage of anus and rectum: Secondary | ICD-10-CM | POA: Diagnosis not present

## 2021-11-24 DIAGNOSIS — R1032 Left lower quadrant pain: Secondary | ICD-10-CM | POA: Diagnosis present

## 2021-11-24 LAB — COMPREHENSIVE METABOLIC PANEL
ALT: 38 U/L (ref 0–44)
AST: 41 U/L (ref 15–41)
Albumin: 4.3 g/dL (ref 3.5–5.0)
Alkaline Phosphatase: 49 U/L (ref 38–126)
Anion gap: 5 (ref 5–15)
BUN: 7 mg/dL (ref 6–20)
CO2: 20 mmol/L — ABNORMAL LOW (ref 22–32)
Calcium: 9.4 mg/dL (ref 8.9–10.3)
Chloride: 115 mmol/L — ABNORMAL HIGH (ref 98–111)
Creatinine, Ser: 0.67 mg/dL (ref 0.44–1.00)
GFR, Estimated: >60 mL/min (ref >60)
Glucose, Bld: 90 mg/dL (ref 70–99)
Potassium: 3.8 mmol/L (ref 3.5–5.1)
Sodium: 140 mmol/L (ref 135–145)
Total Bilirubin: 0.6 mg/dL (ref 0.3–1.2)
Total Protein: 6.9 g/dL (ref 6.5–8.1)

## 2021-11-24 LAB — URINALYSIS, ROUTINE W REFLEX MICROSCOPIC
Bilirubin Urine: NEGATIVE
Glucose, UA: NEGATIVE mg/dL
Hgb urine dipstick: NEGATIVE
Ketones, ur: NEGATIVE mg/dL
Leukocytes,Ua: NEGATIVE
Nitrite: NEGATIVE
Protein, ur: NEGATIVE mg/dL
Specific Gravity, Urine: 1.008 (ref 1.005–1.030)
pH: 6 (ref 5.0–8.0)

## 2021-11-24 LAB — CBC
HCT: 37.9 % (ref 36.0–46.0)
Hemoglobin: 13.2 g/dL (ref 12.0–15.0)
MCH: 30.8 pg (ref 26.0–34.0)
MCHC: 34.8 g/dL (ref 30.0–36.0)
MCV: 88.3 fL (ref 80.0–100.0)
Platelets: 188 10*3/uL (ref 150–400)
RBC: 4.29 MIL/uL (ref 3.87–5.11)
RDW: 12.2 % (ref 11.5–15.5)
WBC: 5.5 10*3/uL (ref 4.0–10.5)
nRBC: 0 % (ref 0.0–0.2)

## 2021-11-24 LAB — LIPASE, BLOOD: Lipase: 46 U/L (ref 11–51)

## 2021-11-24 LAB — POC URINE PREG, ED: Preg Test, Ur: NEGATIVE

## 2021-11-24 MED ORDER — HYDROCORTISONE ACETATE 25 MG RE SUPP: 25.0000 mg | Freq: Two times a day (BID) | RECTAL | 1 refills | Status: DC

## 2021-11-24 MED ORDER — IOHEXOL 300 MG/ML  SOLN
100.0000 mL | Freq: Once | INTRAMUSCULAR | Status: AC | PRN
  Administered 2021-11-24: 100 mL via INTRAVENOUS

## 2021-11-24 NOTE — ED Triage Notes (Signed)
Pt reports abdominal pain and tenderness that started 2 days ago. Pt also reporting she has been passing blood clots in her stool. Pt with hx of IBS.

## 2021-11-24 NOTE — Discharge Instructions (Addendum)
Please call [phone] tomorrow morning and let Dr. Virgina Jock the gastroenterologists office know that you were in the emergency department with worsening rectal bleeding.  I spoke with him just now.  He will try to get you in much quicker than November.  He wants me to give you some Anusol suppositories to use to see if that helps the bleeding in case it is from the hemorrhoids.  Please return for any lightheadedness or markedly heavier bleeding worse pain or fever or vomiting.

## 2021-11-24 NOTE — ED Provider Notes (Signed)
[facility] Provider Note    Event Date/Time   First MD Initiated Contact with Patient 11/24/21 1508     (approximate)   History   Abdominal Pain   HPI  CHALISE PE is a 41 y.o. female who reports history of irritable bowel syndrome who has had increasing cramping recently with some blood clots coming from her rectum.  In the past she has had blood in the toilet and blood on the toilet tissue.  She has an external hemorrhoid.  She has never had clots come out before.  She reports increasing feelings of fullness in her epigastric area when she eats and increasing crampiness as I mentioned in the left side and lower abdomen most of the time.      Physical Exam   Triage Vital Signs: ED Triage Vitals [11/24/21 1219]  Enc Vitals Group     BP (!) 128/100     Pulse Rate 88     Resp 15     Temp 98.1 F (36.7 C)     Temp Source Oral     SpO2 97 %     Weight      Height      Head Circumference      Peak Flow      Pain Score      Pain Loc      Pain Edu?      Excl. in [place]?     Most recent vital signs: Vitals:   11/24/21 1219 11/24/21 1529  BP: (!) 128/100 127/82  Pulse: 88 77  Resp: 15 17  Temp: 98.1 F (36.7 C) 98.3 F (36.8 C)  SpO2: 97% 100%    General: Awake, no distress.  CV:  Good peripheral perfusion.  Regular rate and rhythm no audible murmurs Resp:  Normal effort.  Lungs are clear Abd:  No distention.  Soft bowel sounds are positive there is some mild diffuse tenderness.  Is worse in the lower abdomen. Rectal exam old hemorrhoid and hemorrhoidal tag is present.  It is not tender is not engorged.  Rectal shows no masses and there is nothing in the rectal vault but when I Hemoccult my finger afterwards it is Hemoccult positive.   ED Results / Procedures / Treatments   Labs (all labs ordered are listed, but only abnormal results are displayed) Labs Reviewed  COMPREHENSIVE METABOLIC PANEL - Abnormal; Notable for the following  components:      Result Value   Chloride 115 (*)    CO2 20 (*)    All other components within normal limits  URINALYSIS, ROUTINE W REFLEX MICROSCOPIC - Abnormal; Notable for the following components:   Color, Urine STRAW (*)    APPearance CLEAR (*)    All other components within normal limits  LIPASE, BLOOD  CBC  POC URINE PREG, ED     EKG     RADIOLOGY  CT read by radiology reviewed and interpreted by me does not show any acute pathology PROCEDURES:  Critical Care performed:   Procedures   MEDICATIONS ORDERED IN ED: Medications  iohexol (OMNIPAQUE) 300 MG/ML solution 100 mL (100 mLs Intravenous Contrast Given 11/24/21 1620)     IMPRESSION / MDM / ASSESSMENT AND PLAN / ED COURSE  I reviewed the triage vital signs and the nursing notes. Patient's H&H is stable her white count stable lab work in general stable CT is negative I discussed the patient in detail with Dr. Virgina Jock on-call for GI.  He wants me to try some Anusol suppositories to see if that helps with the hemorrhoids.  He will await her call Monday morning and arrange earlier follow-up.  Differential diagnosis includes, but is not limited to, hemorrhoidal bleeding, inflammatory bowel disease, familial polyposis with bleeding or other causes of chronic bleeding.  Patient's presentation is most consistent with acute complicated illness / injury requiring diagnostic workup.     FINAL CLINICAL IMPRESSION(S) / ED DIAGNOSES   Final diagnoses:  Generalized abdominal pain  Rectal bleeding     Rx / DC Orders   ED Discharge Orders          Ordered    hydrocortisone (ANUSOL-HC) 25 MG suppository  Every 12 hours        11/24/21 1815             Note:  This document was prepared using Dragon voice recognition software and may include unintentional dictation errors.   Nena Polio, MD 11/24/21 [phone]

## 2021-11-26 ENCOUNTER — Encounter: Payer: Self-pay | Admitting: Family Medicine

## 2021-11-26 ENCOUNTER — Ambulatory Visit (INDEPENDENT_AMBULATORY_CARE_PROVIDER_SITE_OTHER): Payer: BC Managed Care – PPO | Admitting: Family Medicine

## 2021-11-26 VITALS — BP 118/72 | HR 88 | Ht 73.0 in | Wt 264.4 lb

## 2021-11-26 DIAGNOSIS — R102 Pelvic and perineal pain unspecified side: Secondary | ICD-10-CM

## 2021-11-26 DIAGNOSIS — R197 Diarrhea, unspecified: Secondary | ICD-10-CM

## 2021-11-26 DIAGNOSIS — N816 Rectocele: Secondary | ICD-10-CM | POA: Diagnosis not present

## 2021-11-26 DIAGNOSIS — R1900 Intra-abdominal and pelvic swelling, mass and lump, unspecified site: Secondary | ICD-10-CM | POA: Diagnosis not present

## 2021-11-26 NOTE — Assessment & Plan Note (Signed)
Chronic /ongoing with h/o endometriosis  Needs new gyn/hers left practice   Ref done

## 2021-11-26 NOTE — Assessment & Plan Note (Signed)
Small (1 cm) lump in fatty tissue of mons pubis , mobile and slt tender to palp  No skin changes  ? If this could be lipoma or cyst inst to watch for redness/skin change or pain  Ref done to gyn Reassuring recent CT pelvis-no adenopathy noted

## 2021-11-26 NOTE — Progress Notes (Signed)
Subjective:    Patient ID: Caitlyn Harris, female    DOB: April 06, 1981, 41 y.o.   MRN: [id number]  HPI Pt presents for lump in pelvic area   Wt Readings from Last 3 Encounters:  11/26/21 264 lb 6.4 oz (119.9 kg)  07/09/21 256 lb 5 oz (116.3 kg)  06/26/21 257 lb (116.6 kg)   34.88 kg/m  Lump/knot in pelvic area  Feels hard like a bead  It is sore to the touch but does not hurt otherwise  Mons are  No drainage  Not red   No vaginal d/c or pain   Had one on forearm  Has noticed for a while- R foream, size of rice  Gets irritated if she fools with it a lot     Recent CTCT ABDOMEN PELVIS W CONTRAST (Accession [id number]) (Order [id number]) Imaging Date: 11/24/2021 Department: [facility] Released By/Authorizing: Nena Polio, MD (auto-released)   Exam Status  Status  Final [99]   PACS [facility] Image Link   Show images for CT ABDOMEN PELVIS W CONTRAST  Study Result  Narrative & Impression  CLINICAL DATA:  Abdominal pain, acute. Rectal bleeding. History of inflammatory bowel disease.   EXAM: CT ABDOMEN AND PELVIS WITH CONTRAST   TECHNIQUE: Multidetector CT imaging of the abdomen and pelvis was performed using the standard protocol following bolus administration of intravenous contrast.   RADIATION DOSE REDUCTION: This exam was performed according to the departmental dose-optimization program which includes automated exposure control, adjustment of the mA and/or kV according to patient size and/or use of iterative reconstruction technique.   CONTRAST:  154m OMNIPAQUE IOHEXOL 300 MG/ML  SOLN   COMPARISON:  CT examination dated July 25, 2019   FINDINGS: Lower chest: No acute abnormality.   Hepatobiliary: Low attenuation of hepatic parenchyma concerning for hepatic steatosis. No focal liver abnormality is seen. No gallstones, gallbladder wall thickening, or biliary dilatation.   Pancreas: Unremarkable. No  pancreatic ductal dilatation or surrounding inflammatory changes.   Spleen: Normal in size without focal abnormality.   Adrenals/Urinary Tract: Adrenal glands are unremarkable. Kidneys are normal, without renal calculi, focal lesion, or hydronephrosis. Bladder is unremarkable.   Stomach/Bowel: Stomach is within normal limits. Appendix appears normal. No evidence of bowel wall thickening, distention, or inflammatory changes.   Vascular/Lymphatic: No significant vascular findings are present. No enlarged abdominal or pelvic lymph nodes.   Reproductive: Status post hysterectomy. No adnexal masses. Left adnexal cyst measuring approximately 3.4 cm, unchanged.   Other: No abdominal wall hernia or abnormality. No abdominopelvic ascites.   Musculoskeletal: Mild degenerate disc disease of the lumbar spine. No acute osseous abnormality.   IMPRESSION: 1.  Hepatic steatosis.   2. Bowel loops are normal in caliber. No evidence of colitis or diverticulitis.   3.  Stable cyst in the left adnexal region.   4.  No CT evidence of acute abdominal/pelvic process.       for another issue-for bloating and abdominal pain -had rectal bleeding and change in bowel habits  Feels like her prolapse is back  She could not make her GI appt in J07/29/2024-her gma died  Now pushed to N11/29/24 Now heartburn is bad/ pain and discomfort   Saw psychiatrist  On effexor and weaning citalopram  Has put on weight    Patient Active Problem List   Diagnosis Date Noted   Pelvic lump 11/26/2021   MDD (major depressive disorder), recurrent severe, without psychosis ([facility] 11/15/2021   Bereavement  11/15/2021   Anxiety 08/21/2021   Attention and concentration deficit 08/21/2021   Tonsillitis with exudate 07/09/2021   Cervical lymphadenopathy 07/09/2021   Nausea 07/07/2021   Abdominal pain 06/26/2021   Bloating 06/26/2021   Diarrhea 06/26/2021   Heartburn 06/26/2021   Acute cough 02/06/2021   Sore throat  02/06/2021   Nasal congestion 02/06/2021   Obesity (BMI 30-39.9) 04/16/2020   Hypertriglyceridemia 06/11/2018   Endometriosis 02/01/2018   Chronic female pelvic pain 02/01/2018   Pelvic pain 01/18/2018   History of endometriosis 01/18/2018   Rectocele 01/18/2018   Insomnia 08/25/2017   Cystocele, midline 64/40/3474   Umbilical hernia without obstruction and without gangrene    Chronic migraine without aura without status migrainosus, not intractable 02/04/2016   Depression 02/04/2016   GAD (generalized anxiety disorder) 02/04/2016   Vitamin D deficiency, unspecified 02/04/2016   Past Medical History:  Diagnosis Date   Anxiety    Depression    Endometriosis    GERD (gastroesophageal reflux disease)    OCC   History of PCOS    Hyperlipidemia    Migraine    Seizures ([place])    FEBRILE AS A BABY   Past Surgical History:  Procedure Laterality Date   ABDOMINAL HYSTERECTOMY     CERVICAL BIOPSY  W/ LOOP ELECTRODE EXCISION     ECTOPIC PREGNANCY SURGERY     endometirosis     HAND SURGERY     THUMB SURGERY   LAPAROSCOPIC OVARIAN CYSTECTOMY Left 03/02/2018   Procedure: LAPAROSCOPIC RIGHT OVARIAN CYSTECTOMY;  Surgeon: Gae Dry, MD;  Location: [facility];  Service: Gynecology;  Laterality: Left;   LAPAROSCOPIC OVARIAN CYSTECTOMY Left 08/30/2019   Procedure: LAPAROSCOPIC OVARIAN CYSTECTOMY;  Surgeon: Gae Dry, MD;  Location: [facility];  Service: Gynecology;  Laterality: Left;   LEEP     LEEP     PERINEOPLASTY N/A 03/02/2018   Procedure: PERINEORRHAPHY;  Surgeon: Gae Dry, MD;  Location: [facility];  Service: Gynecology;  Laterality: N/A;   RECTOCELE REPAIR N/A 03/02/2018   Procedure: POSTERIOR REPAIR (RECTOCELE);  Surgeon: Gae Dry, MD;  Location: [facility];  Service: Gynecology;  Laterality: N/A;   VENTRAL HERNIA REPAIR N/A 03/19/2017   Procedure: HERNIA REPAIR VENTRAL ADULT;  Surgeon: Florene Glen, MD;  Location: [facility];  Service: General;   Laterality: N/A;   Social History   Tobacco Use   Smoking status: Former    Years: 7.00    Types: Cigarettes    Quit date: 03/12/2009    Years since quitting: 12.7   Smokeless tobacco: Never   Tobacco comments:    1 PACK EVERY 2 WEEKS  Vaping Use   Vaping Use: Never used  Substance Use Topics   Alcohol use: Not Currently    Comment: RARE   Drug use: No   Family History  Problem Relation Age of Onset   Hypertension Mother    Diabetes Mother    Renal cancer Father    Hypertension Father    Post-traumatic stress disorder Brother    Drug abuse Maternal Uncle    Alcohol abuse Maternal Uncle    Bipolar disorder Half-Sister    Bipolar disorder Niece    Suicidality Other    Allergies  Allergen Reactions   Tape     Low sensitivity to tape, ok with Tegaderm and paper tape    Current Outpatient Medications on File Prior to Visit  Medication Sig Dispense Refill   acetaminophen (TYLENOL) 500 MG tablet Take 1,000  mg by mouth every 6 (six) hours as needed for moderate pain or headache.     botulinum toxin Type A (BOTOX) 200 units injection Inject 155 units IM into multiple site in the face,neck and head once every 90 days 1 each 4   citalopram (CELEXA) 10 MG tablet Take 1 tablet (10 mg total) by mouth daily. Start taking for 14 days and stop 14 tablet 0   diphenhydrAMINE (BENADRYL) 25 MG tablet Take 25 mg by mouth every 6 (six) hours as needed.     EMGALITY 120 MG/ML SOAJ INJECT 120 MG INTO THE SKIN EVERY 28 (TWENTY-EIGHT) DAYS. 1 mL 0   fluticasone (FLONASE) 50 MCG/ACT nasal spray Place 2 sprays into both nostrils daily. 16 g 0   hydrocortisone (ANUSOL-HC) 25 MG suppository Place 1 suppository (25 mg total) rectally every 12 (twelve) hours. 12 suppository 1   hydrOXYzine (VISTARIL) 25 MG capsule Take 1-2 capsules (25-50 mg total) by mouth at bedtime as needed. For sleep and anxiety 60 capsule 1   ibuprofen (ADVIL) 400 MG tablet Take 400 mg by mouth every 6 (six) hours as needed.      omeprazole (PRILOSEC) 10 MG capsule Take 10 mg by mouth daily.     ondansetron (ZOFRAN-ODT) 4 MG disintegrating tablet TAKE 1 TABLET BY MOUTH EVERY 8 HOURS AS NEEDED FOR NAUSEA AND VOMITING 18 tablet 0   SUMAtriptan (IMITREX) 100 MG tablet MAY REPEAT IN 2 HOURS IF HEADACHE PERSISTS OR RECURS. 30 tablet 3   tiZANidine (ZANAFLEX) 4 MG capsule TAKE 1 CAPSULE BY MOUTH 3 TIMES DAILY AS NEEDED FOR MUSCLE SPASMS. 30 capsule 3   topiramate (TOPAMAX) 100 MG tablet TAKE 1 TABLET BY MOUTH TWICE A DAY 180 tablet 1   venlafaxine XR (EFFEXOR XR) 37.5 MG 24 hr capsule Take 1 capsule (37.5 mg total) by mouth daily with breakfast. 30 capsule 1   No current facility-administered medications on file prior to visit.    Review of Systems  Constitutional:  Negative for activity change, appetite change, fatigue, fever and unexpected weight change.  HENT:  Negative for congestion, ear pain, rhinorrhea, sinus pressure and sore throat.   Eyes:  Negative for pain, redness and visual disturbance.  Respiratory:  Negative for cough, shortness of breath and wheezing.   Cardiovascular:  Negative for chest pain and palpitations.  Gastrointestinal:  Negative for abdominal pain, blood in stool, constipation and diarrhea.  Endocrine: Negative for polydipsia and polyuria.  Genitourinary:  Positive for pelvic pain. Negative for dysuria, frequency and urgency.  Musculoskeletal:  Negative for arthralgias, back pain and myalgias.  Skin:  Negative for pallor and rash.  Allergic/Immunologic: Negative for environmental allergies.  Neurological:  Negative for dizziness, syncope and headaches.  Hematological:  Negative for adenopathy. Does not bruise/bleed easily.  Psychiatric/Behavioral:  Negative for decreased concentration and dysphoric mood. The patient is not nervous/anxious.        Objective:   Physical Exam Constitutional:      General: She is not in acute distress.    Appearance: Normal appearance. She is well-developed.  She is obese. She is not ill-appearing or diaphoretic.  HENT:     Head: Normocephalic and atraumatic.     Mouth/Throat:     Mouth: Mucous membranes are moist.  Eyes:     General:        Right eye: No discharge.        Left eye: No discharge.     Conjunctiva/sclera: Conjunctivae normal.     Pupils:  Pupils are equal, round, and reactive to light.  Neck:     Thyroid: No thyromegaly.     Vascular: No carotid bruit or JVD.  Cardiovascular:     Rate and Rhythm: Normal rate and regular rhythm.     Heart sounds: Normal heart sounds.     No gallop.  Pulmonary:     Effort: Pulmonary effort is normal. No respiratory distress.     Breath sounds: Normal breath sounds. No wheezing or rales.  Abdominal:     General: Abdomen is protuberant. There is no distension or abdominal bruit.     Palpations: Abdomen is soft. There is no hepatomegaly, splenomegaly or pulsatile mass.     Tenderness: There is abdominal tenderness in the right lower quadrant and left lower quadrant. There is no guarding or rebound. Negative signs include Murphy's sign and McBurney's sign.  Genitourinary:    Comments: 1 cm deep lump in fatty tissue of mons pubis in midline  Tender with palpation No swelling or erythema or skin change  Musculoskeletal:     Cervical back: Normal range of motion and neck supple.     Right lower leg: No edema.     Left lower leg: No edema.     Comments: 0.5 cm mobile lump on R forearm (in soft tissue)  Non tender      Lymphadenopathy:     Cervical: No cervical adenopathy.     Lower Body: No right inguinal adenopathy. No left inguinal adenopathy.  Skin:    General: Skin is warm and dry.     Coloration: Skin is not pale.     Findings: No rash.  Neurological:     Mental Status: She is alert.     Cranial Nerves: No cranial nerve deficit.     Coordination: Coordination normal.     Deep Tendon Reflexes: Reflexes are normal and symmetric. Reflexes normal.  Psychiatric:        Mood and  Affect: Mood normal.           Assessment & Plan:   Problem List Items Addressed This Visit       Digestive   Rectocele    This was fixed in the past -now starting to have symptoms/difficult bm  Will ref to gyn for further eval         Other   Diarrhea    With IBS- and some gas pain  Recent CT is reassuring  Missed her GI appt due to loss of fam member and is re scheduled for nov  Recommend trial of citrucel      Pelvic lump - Primary    Small (1 cm) lump in fatty tissue of mons pubis , mobile and slt tender to palp  No skin changes  ? If this could be lipoma or cyst inst to watch for redness/skin change or pain  Ref done to gyn Reassuring recent CT pelvis-no adenopathy noted       Relevant Orders   Ambulatory referral to Gynecology   Pelvic pain    Chronic /ongoing with h/o endometriosis  Needs new gyn/hers left practice   Ref done       Relevant Orders   Ambulatory referral to Gynecology

## 2021-11-26 NOTE — Patient Instructions (Addendum)
Get some miralax over the counter as needed for constipation or to prevent constipation   Daily fiber helps IBS in some folks as well  Citrucel is less gas producing  Avoid foods that flare you   A probiotic over the counter may help I like align   Get on a cancellation list for GI   I placed a gyn referral If you don't hear in 1-2 weeks let us know   If the pelvic lump hurts then try a gentle warm compress Try not to traumatize it  Watch for redness or increase in size

## 2021-11-26 NOTE — Assessment & Plan Note (Addendum)
With IBS- and some gas pain  Recent CT is reassuring  Missed her GI appt due to loss of fam member and is re scheduled for nov  Recommend trial of citrucel

## 2021-11-26 NOTE — Assessment & Plan Note (Signed)
This was fixed in the past -now starting to have symptoms/difficult bm  Will ref to gyn for further eval

## 2021-12-02 ENCOUNTER — Encounter: Payer: Self-pay | Admitting: Family Medicine

## 2021-12-02 NOTE — Telephone Encounter (Signed)
Per PCP pt will double dose of omeprazole, and appt scheduled tomorrow. No other sxs except abd pain. ER precautions given

## 2021-12-03 ENCOUNTER — Encounter: Payer: Self-pay | Admitting: Family Medicine

## 2021-12-03 ENCOUNTER — Ambulatory Visit: Payer: BC Managed Care – PPO | Admitting: Family Medicine

## 2021-12-03 VITALS — BP 112/78 | HR 117 | Temp 98.1°F | Ht 73.0 in | Wt 264.1 lb

## 2021-12-03 DIAGNOSIS — R12 Heartburn: Secondary | ICD-10-CM

## 2021-12-03 DIAGNOSIS — R1011 Right upper quadrant pain: Secondary | ICD-10-CM | POA: Diagnosis not present

## 2021-12-03 DIAGNOSIS — R102 Pelvic and perineal pain: Secondary | ICD-10-CM

## 2021-12-03 DIAGNOSIS — R1084 Generalized abdominal pain: Secondary | ICD-10-CM

## 2021-12-03 LAB — POC URINALSYSI DIPSTICK (AUTOMATED)
Bilirubin, UA: 1
Blood, UA: NEGATIVE
Glucose, UA: NEGATIVE
Ketones, UA: NEGATIVE
Leukocytes, UA: NEGATIVE
Nitrite, UA: NEGATIVE
Protein, UA: NEGATIVE
Spec Grav, UA: 1.015 (ref 1.010–1.025)
Urobilinogen, UA: 0.2 E.U./dL
pH, UA: 7.5 (ref 5.0–8.0)

## 2021-12-03 NOTE — Assessment & Plan Note (Signed)
Recently L lower pelvic area - did note adnexal cyst on last CT  Will check on the status of her gyn referral

## 2021-12-03 NOTE — Progress Notes (Unsigned)
Subjective:    Patient ID: Caitlyn Harris, female    DOB: 02-Feb-1981, 41 y.o.   MRN: [id number]  HPI Pt presents for abdominal pain   Wt Readings from Last 3 Encounters:  12/03/21 264 lb 2 oz (119.8 kg)  11/26/21 264 lb 6.4 oz (119.9 kg)  07/09/21 256 lb 5 oz (116.3 kg)   34.85 kg/m  Pt presents for acute on chronic abd pain  H/o IBS with gas pain   Reassuring labs and CT  Pt called yesterday  Extreme abdominal pain over the weekend  Brought her to tears  Still hurt yesterday   Eating makes it worse  Cristy Friedlander especially    If she does not eat she gets migraines  She was inst to inc prilosec 20 mg to bid yesterday     On Saturday lower and to the left  Having a BM and it got very bad -felt like something was falling in her abd Then excruciating pain - tearful and hyperventilating in fetal position  Very low  Worse on the left  Worse to walk  It left her tender   Has had a hysterectomy in the past  Ectopic preg in the past  Also endometriosis  Waiting on gyn visit - pending a call soon?   Sunday more stomach area - her stomach felt hard  It hurt to the back also  Hurt more to take a deep breath or bend over  Nausea as well  Took tums  Took omeprazole   Today sore over whole abdomen  Gas pains  Increased belching and heartburn  Internal cramps - like her intestines twist up inside or her on and off - lasting 2 minutes at a time    Also still tender from the day before) She had a pc of lasagne prior   Today still tender in upper abdomen   Last CT scan:  CT ABDOMEN PELVIS W CONTRAST (Accession [id number]) (Order [id number]) Imaging Date: 11/24/2021 Department: [facility] Released By/Authorizing: Nena Polio, MD (auto-released)   Exam Status  Status  Final [99]   PACS Intelerad Image Link   Show images for CT ABDOMEN PELVIS W CONTRAST  Study Result  Narrative & Impression  CLINICAL DATA:   Abdominal pain, acute. Rectal bleeding. History of inflammatory bowel disease.   EXAM: CT ABDOMEN AND PELVIS WITH CONTRAST   TECHNIQUE: Multidetector CT imaging of the abdomen and pelvis was performed using the standard protocol following bolus administration of intravenous contrast.   RADIATION DOSE REDUCTION: This exam was performed according to the departmental dose-optimization program which includes automated exposure control, adjustment of the mA and/or kV according to patient size and/or use of iterative reconstruction technique.   CONTRAST:  135m OMNIPAQUE IOHEXOL 300 MG/ML  SOLN   COMPARISON:  CT examination dated July 25, 2019   FINDINGS: Lower chest: No acute abnormality.   Hepatobiliary: Low attenuation of hepatic parenchyma concerning for hepatic steatosis. No focal liver abnormality is seen. No gallstones, gallbladder wall thickening, or biliary dilatation.   Pancreas: Unremarkable. No pancreatic ductal dilatation or surrounding inflammatory changes.   Spleen: Normal in size without focal abnormality.   Adrenals/Urinary Tract: Adrenal glands are unremarkable. Kidneys are normal, without renal calculi, focal lesion, or hydronephrosis. Bladder is unremarkable.   Stomach/Bowel: Stomach is within normal limits. Appendix appears normal. No evidence of bowel wall thickening, distention, or inflammatory changes.   Vascular/Lymphatic: No significant vascular findings are present. No enlarged abdominal or  pelvic lymph nodes.   Reproductive: Status post hysterectomy. No adnexal masses. Left adnexal cyst measuring approximately 3.4 cm, unchanged.   Other: No abdominal wall hernia or abnormality. No abdominopelvic ascites.   Musculoskeletal: Mild degenerate disc disease of the lumbar spine. No acute osseous abnormality.   IMPRESSION: 1.  Hepatic steatosis.   2. Bowel loops are normal in caliber. No evidence of colitis or diverticulitis.   3.  Stable cyst  in the left adnexal region.   4.  No CT evidence of acute abdominal/pelvic process.     Electronically Signed   By: Keane Police D.O.   On: 11/24/2021 16:45      Has GI appt in nov (had to miss a GI appt due to death in family)  Is on cancellation list   She tried to cut out dairy  Liquid dairy like milk and sour cream and ice cream - gives her cramps /gas/ bloat and diarrhea   Tried to cut out seeds and beans  Does not eat nuts because they bother her  Corn does also  Foods that cause gas are bad   Has not tried going off gluten   Now is starting to avoid ibuprofen   Effexor is new- symptoms pre dated that      Lab Results  Component Value Date   CREATININE 0.67 11/24/2021   BUN 7 11/24/2021   NA 140 11/24/2021   K 3.8 11/24/2021   CL 115 (H) 11/24/2021   CO2 20 (L) 11/24/2021   Lab Results  Component Value Date   ALT 38 11/24/2021   AST 41 11/24/2021   ALKPHOS 49 11/24/2021   BILITOT 0.6 11/24/2021   Lab Results  Component Value Date   WBC 5.5 11/24/2021   HGB 13.2 11/24/2021   HCT 37.9 11/24/2021   MCV 88.3 11/24/2021   PLT 188 11/24/2021   Lab Results  Component Value Date   LIPASE 46 11/24/2021   Ua today is clear except for bili   Results for orders placed or performed in visit on 12/03/21  POCT Urinalysis Dipstick (Automated)  Result Value Ref Range   Color, UA Yellow    Clarity, UA Clear    Glucose, UA Negative Negative   Bilirubin, UA 1 mg/dL    Ketones, UA Negative    Spec Grav, UA 1.015 1.010 - 1.025   Blood, UA Negative    pH, UA 7.5 5.0 - 8.0   Protein, UA Negative Negative   Urobilinogen, UA 0.2 0.2 or 1.0 E.U./dL   Nitrite, UA Negative    Leukocytes, UA Negative Negative     Patient Active Problem List   Diagnosis Date Noted   Pelvic lump 11/26/2021   MDD (major depressive disorder), recurrent severe, without psychosis ([place]) 11/15/2021   Bereavement 11/15/2021   Anxiety 08/21/2021   Attention and concentration deficit  08/21/2021   Tonsillitis with exudate 07/09/2021   Cervical lymphadenopathy 07/09/2021   Nausea 07/07/2021   Abdominal pain 06/26/2021   Bloating 06/26/2021   Diarrhea 06/26/2021   Heartburn 06/26/2021   Nasal congestion 02/06/2021   Obesity (BMI 30-39.9) 04/16/2020   Hypertriglyceridemia 06/11/2018   Endometriosis 02/01/2018   Chronic female pelvic pain 02/01/2018   Pelvic pain 01/18/2018   History of endometriosis 01/18/2018   Rectocele 01/18/2018   Insomnia 08/25/2017   Cystocele, midline 50/93/2671   Umbilical hernia without obstruction and without gangrene    Chronic migraine without aura without status migrainosus, not intractable 02/04/2016   Depression 02/04/2016  GAD (generalized anxiety disorder) 02/04/2016   Vitamin D deficiency, unspecified 02/04/2016   Past Medical History:  Diagnosis Date   Anxiety    Depression    Endometriosis    GERD (gastroesophageal reflux disease)    OCC   History of PCOS    Hyperlipidemia    Migraine    Seizures ([place])    FEBRILE AS A BABY   Past Surgical History:  Procedure Laterality Date   ABDOMINAL HYSTERECTOMY     CERVICAL BIOPSY  W/ LOOP ELECTRODE EXCISION     ECTOPIC PREGNANCY SURGERY     endometirosis     HAND SURGERY     THUMB SURGERY   LAPAROSCOPIC OVARIAN CYSTECTOMY Left 03/02/2018   Procedure: LAPAROSCOPIC RIGHT OVARIAN CYSTECTOMY;  Surgeon: Gae Dry, MD;  Location: [facility];  Service: Gynecology;  Laterality: Left;   LAPAROSCOPIC OVARIAN CYSTECTOMY Left 08/30/2019   Procedure: LAPAROSCOPIC OVARIAN CYSTECTOMY;  Surgeon: Gae Dry, MD;  Location: [facility];  Service: Gynecology;  Laterality: Left;   LEEP     LEEP     PERINEOPLASTY N/A 03/02/2018   Procedure: PERINEORRHAPHY;  Surgeon: Gae Dry, MD;  Location: [facility];  Service: Gynecology;  Laterality: N/A;   RECTOCELE REPAIR N/A 03/02/2018   Procedure: POSTERIOR REPAIR (RECTOCELE);  Surgeon: Gae Dry, MD;  Location: [facility];  Service:  Gynecology;  Laterality: N/A;   VENTRAL HERNIA REPAIR N/A 03/19/2017   Procedure: HERNIA REPAIR VENTRAL ADULT;  Surgeon: Florene Glen, MD;  Location: [facility];  Service: General;  Laterality: N/A;   Social History   Tobacco Use   Smoking status: Former    Years: 7.00    Types: Cigarettes    Quit date: 03/12/2009    Years since quitting: 12.7   Smokeless tobacco: Never   Tobacco comments:    1 PACK EVERY 2 WEEKS  Vaping Use   Vaping Use: Never used  Substance Use Topics   Alcohol use: Not Currently    Comment: RARE   Drug use: No   Family History  Problem Relation Age of Onset   Hypertension Mother    Diabetes Mother    Renal cancer Father    Hypertension Father    Post-traumatic stress disorder Brother    Drug abuse Maternal Uncle    Alcohol abuse Maternal Uncle    Bipolar disorder Half-Sister    Bipolar disorder Niece    Suicidality Other    Allergies  Allergen Reactions   Tape     Low sensitivity to tape, ok with Tegaderm and paper tape    Current Outpatient Medications on File Prior to Visit  Medication Sig Dispense Refill   acetaminophen (TYLENOL) 500 MG tablet Take 1,000 mg by mouth every 6 (six) hours as needed for moderate pain or headache.     botulinum toxin Type A (BOTOX) 200 units injection Inject 155 units IM into multiple site in the face,neck and head once every 90 days 1 each 4   citalopram (CELEXA) 10 MG tablet Take 1 tablet (10 mg total) by mouth daily. Start taking for 14 days and stop 14 tablet 0   diphenhydrAMINE (BENADRYL) 25 MG tablet Take 25 mg by mouth every 6 (six) hours as needed.     EMGALITY 120 MG/ML SOAJ INJECT 120 MG INTO THE SKIN EVERY 28 (TWENTY-EIGHT) DAYS. 1 mL 0   fluticasone (FLONASE) 50 MCG/ACT nasal spray Place 2 sprays into both nostrils daily. 16 g 0   hydrocortisone (ANUSOL-HC) 25  MG suppository Place 1 suppository (25 mg total) rectally every 12 (twelve) hours. 12 suppository 1   hydrOXYzine (VISTARIL) 25 MG capsule Take  1-2 capsules (25-50 mg total) by mouth at bedtime as needed. For sleep and anxiety 60 capsule 1   ibuprofen (ADVIL) 400 MG tablet Take 400 mg by mouth every 6 (six) hours as needed.     omeprazole (PRILOSEC) 10 MG capsule Take 10 mg by mouth daily.     ondansetron (ZOFRAN-ODT) 4 MG disintegrating tablet TAKE 1 TABLET BY MOUTH EVERY 8 HOURS AS NEEDED FOR NAUSEA AND VOMITING 18 tablet 0   SUMAtriptan (IMITREX) 100 MG tablet MAY REPEAT IN 2 HOURS IF HEADACHE PERSISTS OR RECURS. 30 tablet 3   tiZANidine (ZANAFLEX) 4 MG capsule TAKE 1 CAPSULE BY MOUTH 3 TIMES DAILY AS NEEDED FOR MUSCLE SPASMS. 30 capsule 3   topiramate (TOPAMAX) 100 MG tablet TAKE 1 TABLET BY MOUTH TWICE A DAY 180 tablet 1   venlafaxine XR (EFFEXOR XR) 37.5 MG 24 hr capsule Take 1 capsule (37.5 mg total) by mouth daily with breakfast. 30 capsule 1   No current facility-administered medications on file prior to visit.    Review of Systems  Constitutional:  Negative for activity change, appetite change, fatigue, fever and unexpected weight change.  HENT:  Negative for congestion, ear pain, rhinorrhea, sinus pressure and sore throat.   Eyes:  Negative for pain, redness and visual disturbance.  Respiratory:  Negative for cough, shortness of breath and wheezing.   Cardiovascular:  Negative for chest pain and palpitations.  Gastrointestinal:  Positive for abdominal distention, abdominal pain, diarrhea and nausea. Negative for anal bleeding, blood in stool, constipation, rectal pain and vomiting.  Endocrine: Negative for polydipsia and polyuria.  Genitourinary:  Positive for flank pain and pelvic pain. Negative for dysuria, frequency, urgency, vaginal bleeding and vaginal discharge.  Musculoskeletal:  Negative for arthralgias, back pain and myalgias.  Skin:  Negative for pallor and rash.  Allergic/Immunologic: Negative for environmental allergies.  Neurological:  Negative for dizziness, syncope and headaches.  Hematological:  Negative  for adenopathy. Does not bruise/bleed easily.  Psychiatric/Behavioral:  Negative for decreased concentration and dysphoric mood. The patient is not nervous/anxious.        Objective:   Physical Exam Constitutional:      General: She is not in acute distress.    Appearance: She is well-developed. She is obese.  HENT:     Head: Normocephalic and atraumatic.  Eyes:     General: No scleral icterus.    Conjunctiva/sclera: Conjunctivae normal.     Pupils: Pupils are equal, round, and reactive to light.  Cardiovascular:     Rate and Rhythm: Normal rate and regular rhythm.     Heart sounds: Normal heart sounds.  Pulmonary:     Effort: Pulmonary effort is normal. No respiratory distress.     Breath sounds: Normal breath sounds. No wheezing or rales.  Abdominal:     General: Abdomen is protuberant. A surgical scar is present. Bowel sounds are normal. There is no distension or abdominal bruit. There are no signs of injury.     Palpations: Abdomen is soft. There is no fluid wave, hepatomegaly, splenomegaly, mass or pulsatile mass.     Tenderness: There is abdominal tenderness in the epigastric area, suprapubic area, left upper quadrant and left lower quadrant. There is no right CVA tenderness, left CVA tenderness, guarding or rebound. Negative signs include Murphy's sign and McBurney's sign.     Hernia: No  hernia is present.  Musculoskeletal:     Cervical back: Normal range of motion and neck supple.     Right lower leg: No edema.     Left lower leg: No edema.  Lymphadenopathy:     Cervical: No cervical adenopathy.  Skin:    General: Skin is warm and dry.     Coloration: Skin is not jaundiced or pale.     Findings: No bruising or erythema.  Neurological:     Mental Status: She is alert.     Cranial Nerves: No cranial nerve deficit.  Psychiatric:     Comments: Tearful at times when discussing her abd pain            Assessment & Plan:   Problem List Items Addressed This Visit        Other   Abdominal pain, generalized - Primary    Pelvic, worse on L  Also epigastric Gas - with bloating  Severe over weekend-in tears balled up on the floor  She cannot get in to local GI until November Last CT reassuring    Disc IBS and past h/o endometriosis Will inc omeprazole to bid  Will check on status of GI referral and see what other options she has in this medically underserved area  Cbc and bmet today Reassuring ua  ER precautions reviewed  Disc trial of gluten free, dairy free diet  Disc trial of FODMAP diet- handout given       Heartburn    inst to inc omeprazole 10 mg to bid pepcid did not help  Pending GI referral  Disc watching diet       Pelvic pain    Recently L lower pelvic area - did note adnexal cyst on last CT  Will check on the status of her gyn referral      Relevant Orders   POCT UA - Microscopic Only   Basic metabolic panel   CBC with Differential/Platelet   POCT Urinalysis Dipstick (Automated) (Completed)

## 2021-12-03 NOTE — Assessment & Plan Note (Addendum)
Pelvic, worse on L  Also epigastric Gas - with bloating  Severe over weekend-in tears balled up on the floor  She cannot get in to local GI until November Last CT reassuring    Disc IBS and past h/o endometriosis Will inc omeprazole to bid  Will check on status of GI referral and see what other options she has in this medically underserved area  Cbc and bmet today Reassuring ua  ER precautions reviewed  Disc trial of gluten free, dairy free diet  Disc trial of FODMAP diet- handout given

## 2021-12-03 NOTE — Patient Instructions (Addendum)
Stay off dairy (all dairy)   It you want to do a trial off gluten it may be worth it  Take a look at the FODMAP diet   Take the omeprazole twice daily now Avoid ibuprofen   Lab today  Urinalysis today   Check your temp if you get chills   I will touch base with referrals about GI and gyn  and get back to you   Keep sipping fluids

## 2021-12-03 NOTE — Assessment & Plan Note (Signed)
inst to inc omeprazole 10 mg to bid pepcid did not help  Pending GI referral  Disc watching diet

## 2021-12-04 LAB — CBC WITH DIFFERENTIAL/PLATELET
Basophils Absolute: 0.1 10*3/uL (ref 0.0–0.1)
Basophils Relative: 1.5 % (ref 0.0–3.0)
Eosinophils Absolute: 0.2 10*3/uL (ref 0.0–0.7)
Eosinophils Relative: 3 % (ref 0.0–5.0)
HCT: 36.6 % (ref 36.0–46.0)
Hemoglobin: 13.2 g/dL (ref 12.0–15.0)
Lymphocytes Relative: 30.4 % (ref 12.0–46.0)
Lymphs Abs: 1.8 10*3/uL (ref 0.7–4.0)
MCHC: 36 g/dL (ref 30.0–36.0)
MCV: 89.3 fl (ref 78.0–100.0)
Monocytes Absolute: 0.4 10*3/uL (ref 0.1–1.0)
Monocytes Relative: 6.4 % (ref 3.0–12.0)
Neutro Abs: 3.5 10*3/uL (ref 1.4–7.7)
Neutrophils Relative %: 58.7 % (ref 43.0–77.0)
Platelets: 174 10*3/uL (ref 150.0–400.0)
RBC: 4.1 Mil/uL (ref 3.87–5.11)
RDW: 13.1 % (ref 11.5–15.5)
WBC: 5.9 10*3/uL (ref 4.0–10.5)

## 2021-12-04 LAB — BASIC METABOLIC PANEL
BUN: 14 mg/dL (ref 6–23)
CO2: 23 mEq/L (ref 19–32)
Calcium: 9 mg/dL (ref 8.4–10.5)
Chloride: 106 mEq/L (ref 96–112)
Creatinine, Ser: 1.1 mg/dL (ref 0.40–1.20)
GFR: 63.56 mL/min (ref >60.00)
Glucose, Bld: 87 mg/dL (ref 70–99)
Potassium: 3.9 mEq/L (ref 3.5–5.1)
Sodium: 138 mEq/L (ref 135–145)

## 2021-12-06 ENCOUNTER — Telehealth: Payer: Self-pay

## 2021-12-06 NOTE — Telephone Encounter (Signed)
Left message on voicemail letting pt know appt was moved to Oct 9 at 2:15 and this is the soonest

## 2021-12-07 ENCOUNTER — Other Ambulatory Visit: Payer: Self-pay | Admitting: Psychiatry

## 2021-12-07 DIAGNOSIS — F332 Major depressive disorder, recurrent severe without psychotic features: Secondary | ICD-10-CM

## 2021-12-07 DIAGNOSIS — F411 Generalized anxiety disorder: Secondary | ICD-10-CM

## 2021-12-11 ENCOUNTER — Ambulatory Visit: Payer: BC Managed Care – PPO | Admitting: Psychiatry

## 2021-12-11 ENCOUNTER — Encounter: Payer: Self-pay | Admitting: Psychiatry

## 2021-12-11 VITALS — BP 120/86 | HR 93 | Temp 98.1°F | Wt 265.4 lb

## 2021-12-11 DIAGNOSIS — Z634 Disappearance and death of family member: Secondary | ICD-10-CM

## 2021-12-11 DIAGNOSIS — F411 Generalized anxiety disorder: Secondary | ICD-10-CM

## 2021-12-11 DIAGNOSIS — F332 Major depressive disorder, recurrent severe without psychotic features: Secondary | ICD-10-CM | POA: Diagnosis not present

## 2021-12-11 DIAGNOSIS — R41840 Attention and concentration deficit: Secondary | ICD-10-CM

## 2021-12-11 MED ORDER — VENLAFAXINE HCL ER 75 MG PO CP24: 75.0000 mg | ORAL_CAPSULE | Freq: Every day | ORAL | 0 refills | Status: DC

## 2021-12-11 NOTE — Progress Notes (Signed)
[facility] MD OP Progress Note  12/11/2021 1:28 PM Caitlyn Harris  MRN:  [id number]  Chief Complaint:  Chief Complaint  Patient presents with   Follow-up: 41 year old Caucasian female with history of depression, anxiety, attention and focus deficit, presented for medication management.   HPI: Caitlyn Harris is a 41 year old Caucasian female, married, employed, lives in [place], has a history of MDD, GAD, bereavement, IBS, migraine headaches, hysterectomy, attention and focus deficit, presented for medication management.  Patient today reports since being on the venlafaxine she may have noticed some good improvement with regards to her mood.  Patient reports she does not feel as depressed as she used to before.  She however reports she does have irritability, low frustration tolerance, especially with her sister.  That does worry her.  Patient reports sleep is overall okay.  Patient continues to struggle with attention and focus deficit, has upcoming appointment with Dr.Altabet -in October.  Patient reports she continues to have appetite reduction.  She however has been trying to eat enough.  She however reports she also has history of IBS, abdominal pain when she tries to eat and has upcoming appointment with gastroenterologist.  Patient continues to grieve the loss of her grandmother and her father.  Became tearful when she discussed them.  Has been coping better than before.  Has not been able to establish care with a therapist yet.  However is planning to.  Patient works with the school system, reports however [employer] are closed due to toxic mold.  They still do not know when they are going to open.  That has been anxiety provoking.  Patient currently denies any suicidality, homicidality or perceptual disturbances.  Patient denies any other concerns today.  Visit Diagnosis:    ICD-10-CM   1. MDD (major depressive disorder), recurrent severe, without psychosis (HCC)   F33.2 venlafaxine XR (EFFEXOR XR) 75 MG 24 hr capsule    2. GAD (generalized anxiety disorder)  F41.1 venlafaxine XR (EFFEXOR XR) 75 MG 24 hr capsule    3. Bereavement  Z63.4     4. Attention and concentration deficit  R41.840       Past Psychiatric History: Reviewed past psychiatric history from progress note on 11/15/2021.  Past trials of medications like Celexa, Ambien, ADHD medications like stimulants-does not remember names.  Past Medical History:  Past Medical History:  Diagnosis Date   Anxiety    Depression    Endometriosis    GERD (gastroesophageal reflux disease)    OCC   History of PCOS    Hyperlipidemia    Migraine    Seizures ([place])    FEBRILE AS A BABY    Past Surgical History:  Procedure Laterality Date   ABDOMINAL HYSTERECTOMY     CERVICAL BIOPSY  W/ LOOP ELECTRODE EXCISION     ECTOPIC PREGNANCY SURGERY     endometirosis     HAND SURGERY     THUMB SURGERY   LAPAROSCOPIC OVARIAN CYSTECTOMY Left 03/02/2018   Procedure: LAPAROSCOPIC RIGHT OVARIAN CYSTECTOMY;  Surgeon: Gae Dry, MD;  Location: [facility];  Service: Gynecology;  Laterality: Left;   LAPAROSCOPIC OVARIAN CYSTECTOMY Left 08/30/2019   Procedure: LAPAROSCOPIC OVARIAN CYSTECTOMY;  Surgeon: Gae Dry, MD;  Location: [facility];  Service: Gynecology;  Laterality: Left;   LEEP     LEEP     PERINEOPLASTY N/A 03/02/2018   Procedure: PERINEORRHAPHY;  Surgeon: Gae Dry, MD;  Location: [facility];  Service: Gynecology;  Laterality: N/A;  RECTOCELE REPAIR N/A 03/02/2018   Procedure: POSTERIOR REPAIR (RECTOCELE);  Surgeon: Gae Dry, MD;  Location: [facility];  Service: Gynecology;  Laterality: N/A;   VENTRAL HERNIA REPAIR N/A 03/19/2017   Procedure: HERNIA REPAIR VENTRAL ADULT;  Surgeon: Florene Glen, MD;  Location: [facility];  Service: General;  Laterality: N/A;    Family Psychiatric History: Reviewed family psychiatric history from progress note on 11/15/2021.  Family History:   Family History  Problem Relation Age of Onset   Hypertension Mother    Diabetes Mother    Renal cancer Father    Hypertension Father    Post-traumatic stress disorder Brother    Drug abuse Maternal Uncle    Alcohol abuse Maternal Uncle    Bipolar disorder Half-Sister    Bipolar disorder Niece    Suicidality Other     Social History: Reviewed social history from progress note on 11/15/2021. Social History   Socioeconomic History   Marital status: Married    Spouse name: Not on file   Number of children: 2   Years of education: Not on file   Highest education level: Master's degree (e.g., MA, MS, MEng, MEd, MSW, MBA)  Occupational History   Not on file  Tobacco Use   Smoking status: Former    Years: 7.00    Types: Cigarettes    Quit date: 03/12/2009    Years since quitting: 12.7   Smokeless tobacco: Never   Tobacco comments:    1 PACK EVERY 2 WEEKS  Vaping Use   Vaping Use: Never used  Substance and Sexual Activity   Alcohol use: Not Currently    Comment: RARE   Drug use: No   Sexual activity: Yes  Other Topics Concern   Not on file  Social History Narrative   Right handed   Social Determinants of Health   Financial Resource Strain: Not on file  Food Insecurity: Not on file  Transportation Needs: Not on file  Physical Activity: Not on file  Stress: Not on file  Social Connections: Not on file    Allergies:  Allergies  Allergen Reactions   Tape     Low sensitivity to tape, ok with Tegaderm and paper tape     Metabolic Disorder Labs: Lab Results  Component Value Date   HGBA1C 4.7 08/17/2017   Lab Results  Component Value Date   PROLACTIN 13.3 12/21/2017   Lab Results  Component Value Date   CHOL 142 06/07/2018   TRIG 193.0 (H) 06/07/2018   HDL 28.00 (L) 06/07/2018   CHOLHDL 5 06/07/2018   VLDL 38.6 06/07/2018   LDLCALC 75 06/07/2018   Lab Results  Component Value Date   TSH 1.57 06/26/2021   TSH 0.89 08/17/2017    Therapeutic Level  Labs: No results found for: "LITHIUM" No results found for: "VALPROATE" No results found for: "CBMZ"  Current Medications: Current Outpatient Medications  Medication Sig Dispense Refill   acetaminophen (TYLENOL) 500 MG tablet Take 1,000 mg by mouth every 6 (six) hours as needed for moderate pain or headache.     botulinum toxin Type A (BOTOX) 200 units injection Inject 155 units IM into multiple site in the face,neck and head once every 90 days 1 each 4   diphenhydrAMINE (BENADRYL) 25 MG tablet Take 25 mg by mouth every 6 (six) hours as needed.     fluticasone (FLONASE) 50 MCG/ACT nasal spray Place 2 sprays into both nostrils daily. 16 g 0   hydrocortisone (ANUSOL-HC) 25 MG  suppository Place 1 suppository (25 mg total) rectally every 12 (twelve) hours. 12 suppository 1   hydrOXYzine (VISTARIL) 25 MG capsule TAKE 1-2 CAPSULES (25-50 MG TOTAL) BY MOUTH AT BEDTIME AS NEEDED. FOR SLEEP AND ANXIETY 180 capsule 0   ibuprofen (ADVIL) 400 MG tablet Take 400 mg by mouth every 6 (six) hours as needed.     omeprazole (PRILOSEC) 10 MG capsule Take 10 mg by mouth daily.     ondansetron (ZOFRAN-ODT) 4 MG disintegrating tablet TAKE 1 TABLET BY MOUTH EVERY 8 HOURS AS NEEDED FOR NAUSEA AND VOMITING 18 tablet 0   SUMAtriptan (IMITREX) 100 MG tablet MAY REPEAT IN 2 HOURS IF HEADACHE PERSISTS OR RECURS. 30 tablet 3   tiZANidine (ZANAFLEX) 4 MG capsule TAKE 1 CAPSULE BY MOUTH 3 TIMES DAILY AS NEEDED FOR MUSCLE SPASMS. 30 capsule 3   topiramate (TOPAMAX) 100 MG tablet TAKE 1 TABLET BY MOUTH TWICE A DAY 180 tablet 1   venlafaxine XR (EFFEXOR XR) 75 MG 24 hr capsule Take 1 capsule (75 mg total) by mouth daily with breakfast. 90 capsule 0   EMGALITY 120 MG/ML SOAJ INJECT 120 MG INTO THE SKIN EVERY 28 (TWENTY-EIGHT) DAYS. (Patient not taking: Reported on 12/11/2021) 1 mL 0   No current facility-administered medications for this visit.     Musculoskeletal: Strength & Muscle Tone: within normal limits Gait & Station:  normal Patient leans: N/A  Psychiatric Specialty Exam: Review of Systems  Gastrointestinal:  Positive for abdominal pain.  Psychiatric/Behavioral:  Positive for decreased concentration and dysphoric mood. The patient is nervous/anxious.   All other systems reviewed and are negative.   Blood pressure 120/86, pulse 93, temperature 98.1 F (36.7 C), temperature source Temporal, weight 265 lb 6.4 oz (120.4 kg).Body mass index is 35.02 kg/m.  General Appearance: Casual  Eye Contact:  Fair  Speech:  Normal Rate  Volume:  Normal  Mood:  Anxious and Dysphoric  Affect:  Tearful  Thought Process:  Goal Directed and Descriptions of Associations: Intact  Orientation:  Full (Time, Place, and Person)  Thought Content: Logical   Suicidal Thoughts:  No  Homicidal Thoughts:  No  Memory:  Immediate;   Fair Recent;   Fair Remote;   Fair  Judgement:  Fair  Insight:  Fair  Psychomotor Activity:  Normal  Concentration:  Concentration: Fair and Attention Span: Fair  Recall:  [employer] of Knowledge: Fair  Language: Fair  Akathisia:  No  Handed:  Right  AIMS (if indicated): not done  Assets:  Communication Skills Desire for Improvement Housing Intimacy Talents/Skills  ADL's:  Intact  Cognition: WNL  Sleep:  Fair   Screenings: GAD-7    Flowsheet Row Office Visit from 12/11/2021 in [place] Office Visit from 11/15/2021 in [facility] Office Visit from 08/11/2018 in [place] at [facility] Visit from 06/11/2018 in [place] at [facility]  Total GAD-7 Score '7 11 3 8      '$ PHQ2-9    [place] Office Visit from 12/11/2021 in [place] Visit from 11/15/2021 in [place] Video Visit from 02/06/2021 in [place] at [facility] Visit from 08/11/2018 in [place] at [facility] from 06/11/2018 in [place] at [facility] Total Score 2 6 0 3 3  PHQ-9 Total Score '12 20 9 13 21      '$ Short Office Visit from 12/11/2021 in [facility] from 11/24/2021 in [place] Office Visit from 11/15/2021 in [place]  C-SSRS RISK CATEGORY No Risk No Risk No Risk        Assessment and Plan: Caitlyn Harris is a 42 year old Caucasian female, currently employed, married, lives in [place], has a history of depression, anxiety, attention and focus deficit, migraine headaches, IBS, hysterectomy was evaluated in office today.  Patient is currently improving on the venlafaxine, tolerating it well although she continues to have mood symptoms, attention and focus deficit.  Discussed plan as noted below.  Plan  MDD-unstable Increase venlafaxine extended release to 75 mg p.o. daily with breakfast Discontinue Celexa. Patient referred for CBT-encouraged to establish care.  Generalized anxiety disorder-unstable Patient encouraged to establish care with therapist. Increase venlafaxine as noted above. Hydroxyzine 25-50 mg at bedtime as needed for anxiety and sleep.  Advised not to combine it with Benadryl which she takes once a week or so for her headaches.  Bereavement-improving Referred for CBT  Attention and concentration deficit-unstable Patient has upcoming neuropsychological testing scheduled.  Follow-up in clinic in 8 to 9 weeks or sooner if needed.  This note was generated in part or whole with voice recognition software. Voice recognition is usually quite accurate but there are transcription errors that can and very often do occur. I apologize for any typographical errors that were not detected and corrected.    Ursula Alert, MD 12/12/2021, 9:48 AM

## 2021-12-11 NOTE — Patient Instructions (Signed)
[facility] - therapy

## 2021-12-12 ENCOUNTER — Telehealth (HOSPITAL_COMMUNITY): Payer: Self-pay | Admitting: Pharmacy Technician

## 2021-12-12 NOTE — Telephone Encounter (Signed)
Patient Advocate Encounter   Received notification that prior authorization for Botox 200UNIT solution is required.   PA submitted on 12/12/2021 Key [id number] Status is pending       Lyndel Safe, [place] Patient Advocate Specialist [place] Patient Advocate Team Direct Number: [phone]  Fax: [phone]

## 2021-12-13 ENCOUNTER — Other Ambulatory Visit (HOSPITAL_COMMUNITY): Payer: Self-pay

## 2021-12-13 NOTE — Telephone Encounter (Signed)
Patient Advocate Encounter  Prior Authorization for Botox 200UNIT solution has been approved.    PA# [id number] Effective dates: 12/12/2021 through 12/13/2022      Lyndel Safe, [place] Patient Advocate Specialist [place] Patient Advocate Team Direct Number: [phone]  Fax: [phone]

## 2021-12-19 ENCOUNTER — Ambulatory Visit: Payer: BC Managed Care – PPO | Admitting: Family Medicine

## 2021-12-19 ENCOUNTER — Encounter: Payer: Self-pay | Admitting: Family Medicine

## 2021-12-19 ENCOUNTER — Telehealth: Payer: Self-pay | Admitting: *Deleted

## 2021-12-19 VITALS — BP 110/70 | HR 80 | Temp 97.8°F | Ht 73.0 in | Wt 267.2 lb

## 2021-12-19 DIAGNOSIS — M542 Cervicalgia: Secondary | ICD-10-CM | POA: Diagnosis not present

## 2021-12-19 DIAGNOSIS — T50905A Adverse effect of unspecified drugs, medicaments and biological substances, initial encounter: Secondary | ICD-10-CM

## 2021-12-19 DIAGNOSIS — F411 Generalized anxiety disorder: Secondary | ICD-10-CM

## 2021-12-19 DIAGNOSIS — M545 Low back pain, unspecified: Secondary | ICD-10-CM

## 2021-12-19 DIAGNOSIS — M25511 Pain in right shoulder: Secondary | ICD-10-CM

## 2021-12-19 LAB — POC URINALSYSI DIPSTICK (AUTOMATED)
Bilirubin, UA: NEGATIVE
Blood, UA: NEGATIVE
Glucose, UA: NEGATIVE
Ketones, UA: NEGATIVE
Leukocytes, UA: NEGATIVE
Nitrite, UA: NEGATIVE
Protein, UA: NEGATIVE
Spec Grav, UA: 1.02 (ref 1.010–1.025)
Urobilinogen, UA: 0.2 E.U./dL
pH, UA: 6 (ref 5.0–8.0)

## 2021-12-19 MED ORDER — TIZANIDINE HCL 4 MG PO CAPS: ORAL_CAPSULE | ORAL | 3 refills | Status: DC

## 2021-12-19 NOTE — Assessment & Plan Note (Signed)
Anxiety/panic and some internal jitters much worse since inc her effexor xr from 37.5 to 75 She is in process of touching base with her psychiatrist   inst pt and her husband/caregiver to drop back to 37.5 mg daily until she can contact her psychiatrist

## 2021-12-19 NOTE — Patient Instructions (Addendum)
Drop back to the venalfaxine xr to 37.5 mg   Tell your psychiatrist what is going on    Drink fluids   Distract yourself the best you can

## 2021-12-19 NOTE — Progress Notes (Signed)
Subjective:    Patient ID: Caitlyn Harris, female    DOB: 09-Aug-1980, 41 y.o.   MRN: [id number]  HPI Pt presents with aches and chills and low back pain  ? Side eff from medicines   Wt Readings from Last 3 Encounters:  12/19/21 267 lb 4 oz (121.2 kg)  12/03/21 264 lb 2 oz (119.8 kg)  11/26/21 264 lb 6.4 oz (119.9 kg)   35.26 kg/m  Tearful today   Low back pain yesterday both sides  Unwell  More emotional - up and down  Glitchy feeling   Insatiable hunger - at times cries if she cannot eat  Irritable  Hot  Restless and not sleeping  Jittery inside  Not feeling herself   Stools are changing- thinner   Just upped venalfaxine xr- last Wednesday  She tried to get in contact with her psychiatrist      UA today  Results for orders placed or performed in visit on 12/19/21  POCT Urinalysis Dipstick (Automated)  Result Value Ref Range   Color, UA Yellow    Clarity, UA Clear    Glucose, UA Negative Negative   Bilirubin, UA Negative    Ketones, UA Negative    Spec Grav, UA 1.020 1.010 - 1.025   Blood, UA Negative    pH, UA 6.0 5.0 - 8.0   Protein, UA Negative Negative   Urobilinogen, UA 0.2 0.2 or 1.0 E.U./dL   Nitrite, UA Negative    Leukocytes, UA Negative Negative     BP Readings from Last 3 Encounters:  12/19/21 110/70  12/03/21 112/78  11/26/21 118/72   Pulse Readings from Last 3 Encounters:  12/19/21 80  12/03/21 (!) 117  11/26/21 88    Has GI appt early oct  Had CT abd/pel last month and noted some mild deg disc dz LS  Patient Active Problem List   Diagnosis Date Noted   Medication reaction 12/19/2021   Low back pain 12/19/2021   Pelvic lump 11/26/2021   MDD (major depressive disorder), recurrent severe, without psychosis ([place]) 11/15/2021   Bereavement 11/15/2021   Anxiety 08/21/2021   Attention and concentration deficit 08/21/2021   Cervical lymphadenopathy 07/09/2021   Nausea 07/07/2021   Abdominal pain, generalized 06/26/2021    Bloating 06/26/2021   Diarrhea 06/26/2021   Heartburn 06/26/2021   Nasal congestion 02/06/2021   Obesity (BMI 30-39.9) 04/16/2020   Hypertriglyceridemia 06/11/2018   Endometriosis 02/01/2018   Chronic female pelvic pain 02/01/2018   Pelvic pain 01/18/2018   History of endometriosis 01/18/2018   Rectocele 01/18/2018   Insomnia 08/25/2017   Cystocele, midline 41/96/2229   Umbilical hernia without obstruction and without gangrene    Chronic migraine without aura without status migrainosus, not intractable 02/04/2016   Depression 02/04/2016   GAD (generalized anxiety disorder) 02/04/2016   Vitamin D deficiency, unspecified 02/04/2016   Past Medical History:  Diagnosis Date   Anxiety    Depression    Endometriosis    GERD (gastroesophageal reflux disease)    OCC   History of PCOS    Hyperlipidemia    Migraine    Seizures ([place])    FEBRILE AS A BABY   Past Surgical History:  Procedure Laterality Date   ABDOMINAL HYSTERECTOMY     CERVICAL BIOPSY  W/ LOOP ELECTRODE EXCISION     ECTOPIC PREGNANCY SURGERY     endometirosis     HAND SURGERY     THUMB SURGERY   LAPAROSCOPIC OVARIAN CYSTECTOMY Left 03/02/2018  Procedure: LAPAROSCOPIC RIGHT OVARIAN CYSTECTOMY;  Surgeon: Gae Dry, MD;  Location: [facility];  Service: Gynecology;  Laterality: Left;   LAPAROSCOPIC OVARIAN CYSTECTOMY Left 08/30/2019   Procedure: LAPAROSCOPIC OVARIAN CYSTECTOMY;  Surgeon: Gae Dry, MD;  Location: [facility];  Service: Gynecology;  Laterality: Left;   LEEP     LEEP     PERINEOPLASTY N/A 03/02/2018   Procedure: PERINEORRHAPHY;  Surgeon: Gae Dry, MD;  Location: [facility];  Service: Gynecology;  Laterality: N/A;   RECTOCELE REPAIR N/A 03/02/2018   Procedure: POSTERIOR REPAIR (RECTOCELE);  Surgeon: Gae Dry, MD;  Location: [facility];  Service: Gynecology;  Laterality: N/A;   VENTRAL HERNIA REPAIR N/A 03/19/2017   Procedure: HERNIA REPAIR VENTRAL ADULT;  Surgeon: Florene Glen,  MD;  Location: [facility];  Service: General;  Laterality: N/A;   Social History   Tobacco Use   Smoking status: Former    Years: 7.00    Types: Cigarettes    Quit date: 03/12/2009    Years since quitting: 12.7   Smokeless tobacco: Never   Tobacco comments:    1 PACK EVERY 2 WEEKS  Vaping Use   Vaping Use: Never used  Substance Use Topics   Alcohol use: Not Currently    Comment: RARE   Drug use: No   Family History  Problem Relation Age of Onset   Hypertension Mother    Diabetes Mother    Renal cancer Father    Hypertension Father    Post-traumatic stress disorder Brother    Drug abuse Maternal Uncle    Alcohol abuse Maternal Uncle    Bipolar disorder Half-Sister    Bipolar disorder Niece    Suicidality Other    Allergies  Allergen Reactions   Tape     Low sensitivity to tape, ok with Tegaderm and paper tape    Current Outpatient Medications on File Prior to Visit  Medication Sig Dispense Refill   acetaminophen (TYLENOL) 500 MG tablet Take 1,000 mg by mouth every 6 (six) hours as needed for moderate pain or headache.     botulinum toxin Type A (BOTOX) 200 units injection Inject 155 units IM into multiple site in the face,neck and head once every 90 days 1 each 4   diphenhydrAMINE (BENADRYL) 25 MG tablet Take 25 mg by mouth every 6 (six) hours as needed.     EMGALITY 120 MG/ML SOAJ INJECT 120 MG INTO THE SKIN EVERY 28 (TWENTY-EIGHT) DAYS. 1 mL 0   fluticasone (FLONASE) 50 MCG/ACT nasal spray Place 2 sprays into both nostrils daily. 16 g 0   hydrocortisone (ANUSOL-HC) 25 MG suppository Place 1 suppository (25 mg total) rectally every 12 (twelve) hours. 12 suppository 1   hydrOXYzine (VISTARIL) 25 MG capsule TAKE 1-2 CAPSULES (25-50 MG TOTAL) BY MOUTH AT BEDTIME AS NEEDED. FOR SLEEP AND ANXIETY 180 capsule 0   ibuprofen (ADVIL) 400 MG tablet Take 400 mg by mouth every 6 (six) hours as needed.     omeprazole (PRILOSEC) 10 MG capsule Take 10 mg by mouth daily.     ondansetron  (ZOFRAN-ODT) 4 MG disintegrating tablet TAKE 1 TABLET BY MOUTH EVERY 8 HOURS AS NEEDED FOR NAUSEA AND VOMITING 18 tablet 0   SUMAtriptan (IMITREX) 100 MG tablet MAY REPEAT IN 2 HOURS IF HEADACHE PERSISTS OR RECURS. 30 tablet 3   topiramate (TOPAMAX) 100 MG tablet TAKE 1 TABLET BY MOUTH TWICE A DAY 180 tablet 1   venlafaxine XR (EFFEXOR XR) 75 MG 24  hr capsule Take 1 capsule (75 mg total) by mouth daily with breakfast. 90 capsule 0   No current facility-administered medications on file prior to visit.    Review of Systems  Constitutional:  Positive for chills and fatigue.  Gastrointestinal:  Positive for abdominal pain.  Endocrine: Positive for heat intolerance.  Musculoskeletal:  Positive for arthralgias and back pain.  Neurological:  Positive for dizziness, tremors and headaches. Negative for syncope and speech difficulty.  Psychiatric/Behavioral:  Positive for decreased concentration, dysphoric mood and sleep disturbance. Negative for suicidal ideas. The patient is nervous/anxious.        Objective:   Physical Exam Constitutional:      General: She is not in acute distress.    Appearance: Normal appearance. She is well-developed. She is obese. She is not ill-appearing or diaphoretic.  HENT:     Head: Normocephalic and atraumatic.     Mouth/Throat:     Mouth: Mucous membranes are moist.  Eyes:     General: No scleral icterus.    Conjunctiva/sclera: Conjunctivae normal.     Pupils: Pupils are equal, round, and reactive to light.  Neck:     Thyroid: No thyromegaly.     Vascular: No carotid bruit or JVD.  Cardiovascular:     Rate and Rhythm: Normal rate and regular rhythm.     Heart sounds: Normal heart sounds.     No gallop.  Pulmonary:     Effort: Pulmonary effort is normal. No respiratory distress.     Breath sounds: Normal breath sounds. No wheezing or rales.  Abdominal:     General: There is no distension or abdominal bruit.     Palpations: Abdomen is soft.      Tenderness: There is no guarding or rebound.  Musculoskeletal:     Cervical back: Normal range of motion and neck supple.     Right lower leg: No edema.     Left lower leg: No edema.  Lymphadenopathy:     Cervical: No cervical adenopathy.  Skin:    General: Skin is warm and dry.     Coloration: Skin is not pale.     Findings: No rash.  Neurological:     Mental Status: She is alert.     Coordination: Coordination normal.     Deep Tendon Reflexes: Reflexes are normal and symmetric. Reflexes normal.  Psychiatric:        Attention and Perception: Attention normal.        Mood and Affect: Mood is anxious and depressed. Affect is tearful.        Speech: Speech is tangential.        Behavior: Behavior normal. Behavior is not agitated.        Cognition and Memory: Cognition normal.     Comments: Pt is tearful and feels panicked during interview but responds appropriately   Candidly discusses symptoms and stressors              Assessment & Plan:   Problem List Items Addressed This Visit       Other   GAD (generalized anxiety disorder) - Primary    Suspect pt has an atypical rxn to inc in effexor xr  Panic/tearful/ brain zaps/ difficulty concentrating No suicidal ideation but seems panicky and has sense of impending doom Supportive spouse is here   Inst her to drop back on it from 75 to 37.5 mg  Disc expectations for that   inst to call psychiatrist asap upon going  home for further inst  ER parameters noted       Low back pain    Worsened low back pain in midst of medication reaction and anxiety  ua is clear Reassuring exam  Refilled tizanidine for this and headaches as it has been helpful Warned of sedation, she tolerates it well      Relevant Medications   tiZANidine (ZANAFLEX) 4 MG capsule   Other Relevant Orders   POCT Urinalysis Dipstick (Automated) (Completed)   Medication reaction    Anxiety/panic and some internal jitters much worse since inc her  effexor xr from 37.5 to 75 She is in process of touching base with her psychiatrist   inst pt and her husband/caregiver to drop back to 37.5 mg daily until she can contact her psychiatrist       Other Visit Diagnoses     Acute pain of right shoulder       Relevant Medications   tiZANidine (ZANAFLEX) 4 MG capsule   Neck pain       Relevant Medications   tiZANidine (ZANAFLEX) 4 MG capsule

## 2021-12-19 NOTE — Telephone Encounter (Signed)
I have open slots tomorrow, I have already communicated with staff-Tina, to schedule this patient for tomorrow.  Please give her a call and reschedule her for tomorrow for further medication changes.

## 2021-12-19 NOTE — Assessment & Plan Note (Addendum)
Suspect pt has an atypical rxn to inc in effexor xr  Panic/tearful/ brain zaps/ difficulty concentrating No suicidal ideation but seems panicky and has sense of impending doom Supportive spouse is here   Inst her to drop back on it from 75 to 37.5 mg  Disc expectations for that   inst to call psychiatrist asap upon going home for further inst  ER parameters noted

## 2021-12-19 NOTE — Telephone Encounter (Signed)
Patient Caitlyn Harris stated that after medication change on last visit --- Increase venlafaxine extended release to 75 mg p.o. daily with breakfast --- she developed a reaction went to see her PCP today & she was informed that she needs to go back to the lower dose. # [phone]

## 2021-12-19 NOTE — Assessment & Plan Note (Signed)
Worsened low back pain in midst of medication reaction and anxiety  ua is clear Reassuring exam  Refilled tizanidine for this and headaches as it has been helpful Warned of sedation, she tolerates it well

## 2021-12-20 ENCOUNTER — Telehealth (INDEPENDENT_AMBULATORY_CARE_PROVIDER_SITE_OTHER): Payer: BC Managed Care – PPO | Admitting: Psychiatry

## 2021-12-20 ENCOUNTER — Encounter: Payer: Self-pay | Admitting: Psychiatry

## 2021-12-20 DIAGNOSIS — F411 Generalized anxiety disorder: Secondary | ICD-10-CM | POA: Diagnosis not present

## 2021-12-20 DIAGNOSIS — T50905A Adverse effect of unspecified drugs, medicaments and biological substances, initial encounter: Secondary | ICD-10-CM

## 2021-12-20 DIAGNOSIS — R41840 Attention and concentration deficit: Secondary | ICD-10-CM

## 2021-12-20 DIAGNOSIS — Z634 Disappearance and death of family member: Secondary | ICD-10-CM | POA: Diagnosis not present

## 2021-12-20 DIAGNOSIS — F332 Major depressive disorder, recurrent severe without psychotic features: Secondary | ICD-10-CM

## 2021-12-20 MED ORDER — CLONAZEPAM 0.5 MG PO TABS: 0.2500 mg | ORAL_TABLET | Freq: Every day | ORAL | 0 refills | Status: DC | PRN

## 2021-12-20 MED ORDER — VENLAFAXINE HCL 25 MG PO TABS: 25.0000 mg | ORAL_TABLET | Freq: Every day | ORAL | 0 refills | Status: DC

## 2021-12-20 MED ORDER — VENLAFAXINE HCL ER 37.5 MG PO CP24: 37.5000 mg | ORAL_CAPSULE | Freq: Every day | ORAL | 0 refills | Status: DC

## 2021-12-20 NOTE — Progress Notes (Signed)
Virtual Visit via Video Note  I connected with Caitlyn Harris on 12/20/21 at  9:30 AM EDT by a video enabled telemedicine application and verified that I am speaking with the correct person using two identifiers.  Location Provider Location : [facility] Patient Location : Work  Participants: Patient , Provider    I discussed the limitations of evaluation and management by telemedicine and the availability of in person appointments. The patient expressed understanding and agreed to proceed.   I discussed the assessment and treatment plan with the patient. The patient was provided an opportunity to ask questions and all were answered. The patient agreed with the plan and demonstrated an understanding of the instructions.   The patient was advised to call back or seek an in-person evaluation if the symptoms worsen or if the condition fails to improve as anticipated.   [facility] MD OP Progress Note  12/20/2021 3:03 PM Caitlyn Harris  MRN:  [id number]  Chief Complaint:  Chief Complaint  Patient presents with   Follow-up: 41 year old Caucasian female with history of depression, anxiety, presented for medication management with adverse side effects to venlafaxine.   HPI: Caitlyn Harris is a 41 year old Caucasian female, married, employed, lives in [place], has a history of MDD, GAD, IBS, migraine headaches, hysterectomy, presented with worsening side effects to venlafaxine.   Patient was evaluated by Dr.Tower, dated 12/19/2021-also received a message from Dr. Glori Bickers, primary care provider, patient evaluated for atypical reaction to increased dosage of venlafaxine extended release.  She reported panic, tearfulness, brain zaps, hot flashes, muscle cramps, difficulty concentrating, on the higher dosage of venlafaxine.  Patient reports her primary care provider has reduced the dosage of venlafaxine back to 37.5 mg.  She reports she took a 37.5 mg this morning.  She continues to have mild symptoms  as noted above although it is improving.  Patient continues to have depressive symptoms, as well as anxiety, and the medication is currently not helping.  Patient denies any suicidality, homicidality or perceptual disturbances.  Patient reports she is back at work .  Does report appetite reduction especially worsened by current side effect of venlafaxine.  Denies any other concerns today.   Visit Diagnosis:    ICD-10-CM   1. MDD (major depressive disorder), recurrent severe, without psychosis (HCC)  F33.2 venlafaxine XR (EFFEXOR XR) 37.5 MG 24 hr capsule    venlafaxine (EFFEXOR) 25 MG tablet    clonazePAM (KLONOPIN) 0.5 MG tablet    2. GAD (generalized anxiety disorder)  F41.1 venlafaxine XR (EFFEXOR XR) 37.5 MG 24 hr capsule    venlafaxine (EFFEXOR) 25 MG tablet    clonazePAM (KLONOPIN) 0.5 MG tablet    3. Bereavement  Z63.4     4. Attention and concentration deficit  R41.840     5. Adverse effect of drug, initial encounter  T50.905A    effexor       Past Psychiatric History: Reviewed past psychiatric history from progress note on 11/15/2021.  Past trials of medications like Celexa, Ambien, ADHD medications like stimulants-does not remember names.    Past Medical History:  Past Medical History:  Diagnosis Date   Anxiety    Depression    Endometriosis    GERD (gastroesophageal reflux disease)    OCC   History of PCOS    Hyperlipidemia    Migraine    Seizures ([place])    FEBRILE AS A BABY    Past Surgical History:  Procedure Laterality Date   ABDOMINAL HYSTERECTOMY  CERVICAL BIOPSY  W/ LOOP ELECTRODE EXCISION     ECTOPIC PREGNANCY SURGERY     endometirosis     HAND SURGERY     THUMB SURGERY   LAPAROSCOPIC OVARIAN CYSTECTOMY Left 03/02/2018   Procedure: LAPAROSCOPIC RIGHT OVARIAN CYSTECTOMY;  Surgeon: Gae Dry, MD;  Location: [facility];  Service: Gynecology;  Laterality: Left;   LAPAROSCOPIC OVARIAN CYSTECTOMY Left 08/30/2019   Procedure: LAPAROSCOPIC  OVARIAN CYSTECTOMY;  Surgeon: Gae Dry, MD;  Location: [facility];  Service: Gynecology;  Laterality: Left;   LEEP     LEEP     PERINEOPLASTY N/A 03/02/2018   Procedure: PERINEORRHAPHY;  Surgeon: Gae Dry, MD;  Location: [facility];  Service: Gynecology;  Laterality: N/A;   RECTOCELE REPAIR N/A 03/02/2018   Procedure: POSTERIOR REPAIR (RECTOCELE);  Surgeon: Gae Dry, MD;  Location: [facility];  Service: Gynecology;  Laterality: N/A;   VENTRAL HERNIA REPAIR N/A 03/19/2017   Procedure: HERNIA REPAIR VENTRAL ADULT;  Surgeon: Florene Glen, MD;  Location: [facility];  Service: General;  Laterality: N/A;    Family Psychiatric History: Reviewed family psychiatric history from progress note on 11/15/2021.  Family History:  Family History  Problem Relation Age of Onset   Hypertension Mother    Diabetes Mother    Renal cancer Father    Hypertension Father    Post-traumatic stress disorder Brother    Drug abuse Maternal Uncle    Alcohol abuse Maternal Uncle    Bipolar disorder Half-Sister    Bipolar disorder Niece    Suicidality Other     Social History: Reviewed social history from progress note on 11/15/2021. Social History   Socioeconomic History   Marital status: Married    Spouse name: Not on file   Number of children: 2   Years of education: Not on file   Highest education level: Master's degree (e.g., MA, MS, MEng, MEd, MSW, MBA)  Occupational History   Not on file  Tobacco Use   Smoking status: Former    Years: 7.00    Types: Cigarettes    Quit date: 03/12/2009    Years since quitting: 12.7   Smokeless tobacco: Never   Tobacco comments:    1 PACK EVERY 2 WEEKS  Vaping Use   Vaping Use: Never used  Substance and Sexual Activity   Alcohol use: Not Currently    Comment: RARE   Drug use: No   Sexual activity: Yes  Other Topics Concern   Not on file  Social History Narrative   Right handed   Social Determinants of Health   Financial Resource  Strain: Not on file  Food Insecurity: Not on file  Transportation Needs: Not on file  Physical Activity: Not on file  Stress: Not on file  Social Connections: Not on file    Allergies:  Allergies  Allergen Reactions   Tape     Low sensitivity to tape, ok with Tegaderm and paper tape     Metabolic Disorder Labs: Lab Results  Component Value Date   HGBA1C 4.7 08/17/2017   Lab Results  Component Value Date   PROLACTIN 13.3 12/21/2017   Lab Results  Component Value Date   CHOL 142 06/07/2018   TRIG 193.0 (H) 06/07/2018   HDL 28.00 (L) 06/07/2018   CHOLHDL 5 06/07/2018   VLDL 38.6 06/07/2018   LDLCALC 75 06/07/2018   Lab Results  Component Value Date   TSH 1.57 06/26/2021   TSH 0.89 08/17/2017  Therapeutic Level Labs: No results found for: "LITHIUM" No results found for: "VALPROATE" No results found for: "CBMZ"  Current Medications: Current Outpatient Medications  Medication Sig Dispense Refill   acetaminophen (TYLENOL) 500 MG tablet Take 1,000 mg by mouth every 6 (six) hours as needed for moderate pain or headache.     botulinum toxin Type A (BOTOX) 200 units injection Inject 155 units IM into multiple site in the face,neck and head once every 90 days 1 each 4   clonazePAM (KLONOPIN) 0.5 MG tablet Take 0.5-1 tablets (0.25-0.5 mg total) by mouth daily as needed for anxiety. Please limit use 10 tablet 0   diphenhydrAMINE (BENADRYL) 25 MG tablet Take 25 mg by mouth every 6 (six) hours as needed.     EMGALITY 120 MG/ML SOAJ INJECT 120 MG INTO THE SKIN EVERY 28 (TWENTY-EIGHT) DAYS. 1 mL 0   fluticasone (FLONASE) 50 MCG/ACT nasal spray Place 2 sprays into both nostrils daily. 16 g 0   hydrocortisone (ANUSOL-HC) 25 MG suppository Place 1 suppository (25 mg total) rectally every 12 (twelve) hours. 12 suppository 1   hydrOXYzine (VISTARIL) 25 MG capsule TAKE 1-2 CAPSULES (25-50 MG TOTAL) BY MOUTH AT BEDTIME AS NEEDED. FOR SLEEP AND ANXIETY 180 capsule 0   ibuprofen (ADVIL)  400 MG tablet Take 400 mg by mouth every 6 (six) hours as needed.     omeprazole (PRILOSEC) 10 MG capsule Take 10 mg by mouth daily.     ondansetron (ZOFRAN-ODT) 4 MG disintegrating tablet TAKE 1 TABLET BY MOUTH EVERY 8 HOURS AS NEEDED FOR NAUSEA AND VOMITING 18 tablet 0   SUMAtriptan (IMITREX) 100 MG tablet MAY REPEAT IN 2 HOURS IF HEADACHE PERSISTS OR RECURS. 30 tablet 3   tiZANidine (ZANAFLEX) 4 MG capsule TAKE 1 CAPSULE BY MOUTH 3 TIMES DAILY AS NEEDED FOR MUSCLE SPASMS. 30 capsule 3   topiramate (TOPAMAX) 100 MG tablet TAKE 1 TABLET BY MOUTH TWICE A DAY 180 tablet 1   [START ON 12/25/2021] venlafaxine (EFFEXOR) 25 MG tablet Take 1 tablet (25 mg total) by mouth daily with breakfast. 14 tablet 0   venlafaxine XR (EFFEXOR XR) 37.5 MG 24 hr capsule Take 1 capsule (37.5 mg total) by mouth daily with breakfast. 7 capsule 0   No current facility-administered medications for this visit.     Musculoskeletal: Strength & Muscle Tone:  UTA Gait & Station:  Seated Patient leans: N/A  Psychiatric Specialty Exam: Review of Systems  Psychiatric/Behavioral:  Positive for decreased concentration, dysphoric mood and sleep disturbance. The patient is nervous/anxious.   All other systems reviewed and are negative.   There were no vitals taken for this visit.There is no height or weight on file to calculate BMI.  General Appearance: Casual  Eye Contact:  Fair  Speech:  Clear and Coherent  Volume:  Normal  Mood:  Anxious and Depressed  Affect:  Congruent  Thought Process:  Goal Directed and Descriptions of Associations: Intact  Orientation:  Full (Time, Place, and Person)  Thought Content: Logical   Suicidal Thoughts:  No  Homicidal Thoughts:  No  Memory:  Immediate;   Fair Recent;   Fair Remote;   Fair  Judgement:  Fair  Insight:  Fair  Psychomotor Activity:  Normal  Concentration:  Concentration: Fair and Attention Span: Fair  Recall:  [employer] of Knowledge: Fair  Language: Fair   Akathisia:  No  Handed:  Right  AIMS (if indicated): not done  Assets:  Communication Skills Desire for Improvement Housing  Intimacy Social Support Heritage manager  ADL's:  Intact  Cognition: WNL  Sleep:   restless   Screenings: GAD-7    Flowsheet Row Office Visit from 12/11/2021 in [place] Office Visit from 11/15/2021 in [place] Office Visit from 08/11/2018 in [place] at [facility] Visit from 06/11/2018 in [place] at [facility]  Total GAD-7 Score '7 11 3 8      '$ PHQ2-9    [place] Visit from 12/11/2021 in [place] Office Visit from 11/15/2021 in [place] Video Visit from 02/06/2021 in [place] at [facility] Visit from 08/11/2018 in [place] at [facility] Visit from 06/11/2018 in [place] at [place]  PHQ-2 Total Score 2 6 0 3 3  PHQ-9 Total Score '12 20 9 13 21      '$ Flowsheet Row Video Visit from 12/20/2021 in [place] Office Visit from 12/11/2021 in [place] ED from 11/24/2021 in [place]  C-SSRS RISK CATEGORY No Risk No Risk No Risk        Assessment and Plan: Caitlyn Harris is a 41 year old Caucasian female currently employed, married, lives in [place], has a history of depression, anxiety, attention and focus deficit, migraine headaches, IBS, hysterectomy was evaluated by telemedicine today.  Patient with recent adverse reaction to venlafaxine, will benefit from the following plan.  Plan  MDD-unstable Taper off venlafaxine, advised patient to take venlafaxine extended release 37.5 mg for 7 days and then start taking venlafaxine 25 mg for another 7 days and stop taking it.  However if she is having significant side  effects she could just stop taking the venlafaxine and does not have to taper it down if she tolerates it okay. We will consider starting another psychotropic/antidepressant at her next visit.  Generalized anxiety disorder-unstable Hydroxyzine 25-50 mg at bedtime as needed for anxiety sleep Start clonazepam 0.25-0.5 mg daily as needed, advised to limit use. Referred for CBT  Bereavement-improving Referred for CBT  Attention and concentration deficit-unstable Patient has upcoming neuropsychological testing.  Adverse side effects to venlafaxine-unstable Taper off venlafaxine as noted above.   Follow-up in clinic in 3 to 4 weeks or sooner if needed.  Collaboration of Care: Collaboration of Care: Other patient to go to the nearest emergency department if she has any significant adverse side effects or other concerns.  Patient/Guardian was advised Release of Information must be obtained prior to any record release in order to collaborate their care with an outside provider. Patient/Guardian was advised if they have not already done so to contact the registration department to sign all necessary forms in order for [place] to release information regarding their care.   Consent: Patient/Guardian gives verbal consent for treatment and assignment of benefits for services provided during this visit. Patient/Guardian expressed understanding and agreed to proceed.   This note was generated in part or whole with voice recognition software. Voice recognition is usually quite accurate but there are transcription errors that can and very often do occur. I apologize for any typographical errors that were not detected and corrected.      Ursula Alert, MD 12/20/2021, 3:03 PM

## 2021-12-23 NOTE — Progress Notes (Signed)
Thank you , I have evaluated this patient.

## 2021-12-27 ENCOUNTER — Ambulatory Visit
Admission: RE | Admit: 2021-12-27 | Discharge: 2021-12-27 | Disposition: A | Discharge status: Home / Self Care | Payer: BC Managed Care – PPO | Source: Ambulatory Visit | Attending: Urgent Care | Admitting: Urgent Care

## 2021-12-27 ENCOUNTER — Ambulatory Visit: Admit: 2021-12-27 | Payer: BC Managed Care – PPO

## 2021-12-27 ENCOUNTER — Ambulatory Visit: Payer: BC Managed Care – PPO | Admitting: Psychiatry

## 2021-12-27 VITALS — BP 119/87 | HR 110 | Temp 98.2°F | Resp 18 | Ht 73.0 in | Wt 265.0 lb

## 2021-12-27 DIAGNOSIS — R070 Pain in throat: Secondary | ICD-10-CM

## 2021-12-27 DIAGNOSIS — R197 Diarrhea, unspecified: Secondary | ICD-10-CM | POA: Insufficient documentation

## 2021-12-27 DIAGNOSIS — R000 Tachycardia, unspecified: Secondary | ICD-10-CM | POA: Insufficient documentation

## 2021-12-27 DIAGNOSIS — U071 COVID-19: Secondary | ICD-10-CM | POA: Diagnosis not present

## 2021-12-27 DIAGNOSIS — J029 Acute pharyngitis, unspecified: Secondary | ICD-10-CM | POA: Diagnosis not present

## 2021-12-27 DIAGNOSIS — R0602 Shortness of breath: Secondary | ICD-10-CM

## 2021-12-27 DIAGNOSIS — R0789 Other chest pain: Secondary | ICD-10-CM | POA: Diagnosis not present

## 2021-12-27 DIAGNOSIS — R112 Nausea with vomiting, unspecified: Secondary | ICD-10-CM | POA: Diagnosis not present

## 2021-12-27 DIAGNOSIS — B349 Viral infection, unspecified: Secondary | ICD-10-CM

## 2021-12-27 LAB — RESP PANEL BY RT-PCR (FLU A&B, COVID) ARPGX2
Influenza A by PCR: NEGATIVE
Influenza B by PCR: NEGATIVE
SARS Coronavirus 2 by RT PCR: POSITIVE — AB

## 2021-12-27 LAB — POCT RAPID STREP A (OFFICE): Rapid Strep A Screen: NEGATIVE

## 2021-12-27 MED ORDER — ONDANSETRON 8 MG PO TBDP: 8.0000 mg | ORAL_TABLET | Freq: Three times a day (TID) | ORAL | 0 refills | Status: DC | PRN

## 2021-12-27 NOTE — ED Triage Notes (Signed)
Patient to Urgent Care with complaints of sore throat and body aches. Last night diarrhea. Emesis this morning  Progressively feeling worse throughout the day. Some sinus congestion  Reports she is coming off citalopram, also detoxing from effexor.   Negative covid test at home.

## 2021-12-27 NOTE — Discharge Instructions (Signed)
We will notify you of your test results as they arrive and may take between about 24 hours for COVID, flu test and 3 days for strep culture.  I encourage you to sign up for MyChart if you have not already done so as this can be the easiest way for [place] to communicate results to you online or through a phone app.  Generally, we only contact you if it is a positive test result.  In the meantime, if you develop worsening symptoms including fever, chest pain, shortness of breath despite our current treatment plan then please report to the emergency room as this may be a sign of worsening status from possible viral infection.  Otherwise, we will manage this as a viral syndrome. For sore throat or cough try using a honey-based tea. Use 3 teaspoons of honey with juice squeezed from half lemon. Place shaved pieces of ginger into 1/2-1 cup of water and warm over stove top. Then mix the ingredients and repeat every 4 hours as needed. Please take Tylenol '500mg'$ -'650mg'$  and/or ibuprofen at '400mg'$ -'600mg'$  every 6 hours for aches and pains, fevers. Hydrate very well with at least 2 liters of water. Eat light meals such as soups to replenish electrolytes and soft fruits, veggies. Start an antihistamine like Zyrtec for postnasal drainage, sinus congestion.  You can take this together with pseudoephedrine (Sudafed) at a dose of 60 mg 2-3 times a day as needed for the same kind of congestion.  Use cough medications as needed.

## 2021-12-27 NOTE — ED Provider Notes (Addendum)
[facility] [place]  Note:  This document was prepared using Systems analyst and may include unintentional dictation errors.  MRN: [id number] DOB: 1981/03/02  Subjective:   Caitlyn Harris is a 41 y.o. female presenting for 2-day history of acute onset throat pain, body pains, chest tightness, shortness of breath.  Started to have upset stomach and diarrhea, congestion.  Patient did a COVID test at home and was negative but is not opposed to a repeat through our clinic.  Would like to see about strep as well.  She does have an inhaler that she has used in the past, does not need a refill.  Patient is not a smoker.  No current facility-administered medications for this encounter.  Current Outpatient Medications:    acetaminophen (TYLENOL) 500 MG tablet, Take 1,000 mg by mouth every 6 (six) hours as needed for moderate pain or headache., Disp: , Rfl:    botulinum toxin Type A (BOTOX) 200 units injection, Inject 155 units IM into multiple site in the face,neck and head once every 90 days, Disp: 1 each, Rfl: 4   clonazePAM (KLONOPIN) 0.5 MG tablet, Take 0.5-1 tablets (0.25-0.5 mg total) by mouth daily as needed for anxiety. Please limit use, Disp: 10 tablet, Rfl: 0   diphenhydrAMINE (BENADRYL) 25 MG tablet, Take 25 mg by mouth every 6 (six) hours as needed., Disp: , Rfl:    EMGALITY 120 MG/ML SOAJ, INJECT 120 MG INTO THE SKIN EVERY 28 (TWENTY-EIGHT) DAYS., Disp: 1 mL, Rfl: 0   fluticasone (FLONASE) 50 MCG/ACT nasal spray, Place 2 sprays into both nostrils daily., Disp: 16 g, Rfl: 0   hydrocortisone (ANUSOL-HC) 25 MG suppository, Place 1 suppository (25 mg total) rectally every 12 (twelve) hours., Disp: 12 suppository, Rfl: 1   hydrOXYzine (VISTARIL) 25 MG capsule, TAKE 1-2 CAPSULES (25-50 MG TOTAL) BY MOUTH AT BEDTIME AS NEEDED. FOR SLEEP AND ANXIETY, Disp: 180 capsule, Rfl: 0   ibuprofen (ADVIL) 400 MG tablet, Take 400 mg by mouth every 6 (six) hours as needed., Disp: ,  Rfl:    omeprazole (PRILOSEC) 10 MG capsule, Take 10 mg by mouth daily., Disp: , Rfl:    ondansetron (ZOFRAN-ODT) 4 MG disintegrating tablet, TAKE 1 TABLET BY MOUTH EVERY 8 HOURS AS NEEDED FOR NAUSEA AND VOMITING, Disp: 18 tablet, Rfl: 0   SUMAtriptan (IMITREX) 100 MG tablet, MAY REPEAT IN 2 HOURS IF HEADACHE PERSISTS OR RECURS., Disp: 30 tablet, Rfl: 3   tiZANidine (ZANAFLEX) 4 MG capsule, TAKE 1 CAPSULE BY MOUTH 3 TIMES DAILY AS NEEDED FOR MUSCLE SPASMS., Disp: 30 capsule, Rfl: 3   topiramate (TOPAMAX) 100 MG tablet, TAKE 1 TABLET BY MOUTH TWICE A DAY, Disp: 180 tablet, Rfl: 1   venlafaxine (EFFEXOR) 25 MG tablet, Take 1 tablet (25 mg total) by mouth daily with breakfast., Disp: 14 tablet, Rfl: 0   venlafaxine XR (EFFEXOR XR) 37.5 MG 24 hr capsule, Take 1 capsule (37.5 mg total) by mouth daily with breakfast., Disp: 7 capsule, Rfl: 0   Allergies  Allergen Reactions   Tape     Low sensitivity to tape, ok with Tegaderm and paper tape     Past Medical History:  Diagnosis Date   Anxiety    Depression    Endometriosis    GERD (gastroesophageal reflux disease)    OCC   History of PCOS    Hyperlipidemia    Migraine    Seizures (HCC)    FEBRILE AS A BABY     Past Surgical  History:  Procedure Laterality Date   ABDOMINAL HYSTERECTOMY     CERVICAL BIOPSY  W/ LOOP ELECTRODE EXCISION     ECTOPIC PREGNANCY SURGERY     endometirosis     HAND SURGERY     THUMB SURGERY   LAPAROSCOPIC OVARIAN CYSTECTOMY Left 03/02/2018   Procedure: LAPAROSCOPIC RIGHT OVARIAN CYSTECTOMY;  Surgeon: Gae Dry, MD;  Location: [facility];  Service: Gynecology;  Laterality: Left;   LAPAROSCOPIC OVARIAN CYSTECTOMY Left 08/30/2019   Procedure: LAPAROSCOPIC OVARIAN CYSTECTOMY;  Surgeon: Gae Dry, MD;  Location: [facility];  Service: Gynecology;  Laterality: Left;   LEEP     LEEP     PERINEOPLASTY N/A 03/02/2018   Procedure: PERINEORRHAPHY;  Surgeon: Gae Dry, MD;  Location: [facility];  Service:  Gynecology;  Laterality: N/A;   RECTOCELE REPAIR N/A 03/02/2018   Procedure: POSTERIOR REPAIR (RECTOCELE);  Surgeon: Gae Dry, MD;  Location: [facility];  Service: Gynecology;  Laterality: N/A;   VENTRAL HERNIA REPAIR N/A 03/19/2017   Procedure: HERNIA REPAIR VENTRAL ADULT;  Surgeon: Florene Glen, MD;  Location: [facility];  Service: General;  Laterality: N/A;    Family History  Problem Relation Age of Onset   Hypertension Mother    Diabetes Mother    Renal cancer Father    Hypertension Father    Post-traumatic stress disorder Brother    Drug abuse Maternal Uncle    Alcohol abuse Maternal Uncle    Bipolar disorder Half-Sister    Bipolar disorder Niece    Suicidality Other     Social History   Tobacco Use   Smoking status: Former    Years: 7.00    Types: Cigarettes    Quit date: 03/12/2009    Years since quitting: 12.8   Smokeless tobacco: Never   Tobacco comments:    1 PACK EVERY 2 WEEKS  Vaping Use   Vaping Use: Never used  Substance Use Topics   Alcohol use: Not Currently    Comment: RARE   Drug use: No    ROS   Objective:   Vitals: BP 119/87   Pulse (!) 110   Temp 98.2 F (36.8 C)   Resp 18   Ht '6\' 1"'$  (1.854 m)   Wt 265 lb (120.2 kg)   SpO2 98%   BMI 34.96 kg/m   Physical Exam Constitutional:      General: She is not in acute distress.    Appearance: Normal appearance. She is well-developed. She is not ill-appearing, toxic-appearing or diaphoretic.  HENT:     Head: Normocephalic and atraumatic.     Nose: Nose normal.     Mouth/Throat:     Mouth: Mucous membranes are moist.     Pharynx: Posterior oropharyngeal erythema (with associated thick streaks of post-nasal drainage) present. No pharyngeal swelling, oropharyngeal exudate or uvula swelling.     Tonsils: No tonsillar exudate or tonsillar abscesses. 0 on the right. 0 on the left.  Eyes:     General: No scleral icterus.       Right eye: No discharge.        Left eye: No discharge.      Extraocular Movements: Extraocular movements intact.  Cardiovascular:     Rate and Rhythm: Normal rate and regular rhythm.     Heart sounds: Normal heart sounds. No murmur heard.    No friction rub. No gallop.  Pulmonary:     Effort: Pulmonary effort is normal. No respiratory distress.  Breath sounds: No stridor. No wheezing, rhonchi or rales.  Chest:     Chest wall: No tenderness.  Skin:    General: Skin is warm and dry.  Neurological:     General: No focal deficit present.     Mental Status: She is alert and oriented to person, place, and time.  Psychiatric:        Mood and Affect: Mood normal.        Behavior: Behavior normal.     Results for orders placed or performed during the hospital encounter of 12/27/21 (from the past 24 hour(s))  POCT rapid strep A     Status: None   Collection Time: 12/27/21  3:24 PM  Result Value Ref Range   Rapid Strep A Screen Negative Negative    Assessment and Plan :   PDMP not reviewed this encounter.  1. Acute viral syndrome   2. Chest tightness   3. Shortness of breath   4. Throat pain   5. Tachycardia, unspecified   6. Nausea vomiting and diarrhea     Deferred imaging given clear pulmonary exam, hemodynamically stable vital signs. Strep culture, COVID and flu test pending.  We will otherwise manage for viral upper respiratory infection. Physical exam findings reassuring and vital signs stable for discharge. Advised supportive care, offered symptomatic relief. Counseled patient on potential for adverse effects with medications prescribed/recommended today, ER and return-to-clinic precautions discussed, patient verbalized understanding.       Jaynee Eagles, [place] 12/27/21 1535

## 2022-01-01 LAB — CULTURE, GROUP A STREP (THRC)

## 2022-01-05 ENCOUNTER — Other Ambulatory Visit: Payer: Self-pay | Admitting: Family Medicine

## 2022-01-05 DIAGNOSIS — G43709 Chronic migraine without aura, not intractable, without status migrainosus: Secondary | ICD-10-CM

## 2022-01-06 NOTE — Telephone Encounter (Signed)
Last OV was on 12/19/21, last filled on 07/05/21 #180 tab/ 1 refill

## 2022-01-09 ENCOUNTER — Ambulatory Visit: Payer: BC Managed Care – PPO | Admitting: Family Medicine

## 2022-01-09 ENCOUNTER — Encounter: Payer: Self-pay | Admitting: Family Medicine

## 2022-01-09 VITALS — BP 122/78 | HR 114 | Temp 98.9°F | Ht 73.0 in | Wt 268.0 lb

## 2022-01-09 DIAGNOSIS — J069 Acute upper respiratory infection, unspecified: Secondary | ICD-10-CM | POA: Diagnosis not present

## 2022-01-09 DIAGNOSIS — J029 Acute pharyngitis, unspecified: Secondary | ICD-10-CM | POA: Diagnosis not present

## 2022-01-09 LAB — POCT INFLUENZA A/B
Influenza A, POC: NEGATIVE
Influenza B, POC: NEGATIVE

## 2022-01-09 LAB — POCT RAPID STREP A (OFFICE): Rapid Strep A Screen: NEGATIVE

## 2022-01-09 NOTE — Patient Instructions (Signed)
Your sore throat may be from a virus  Strep test is negative  Flu test is negative   Drink lots of fluids Rest  Tylenol and ibuprofen are ok  Salt water gargles are good  Choraseptic throat products are good   You may develop more classic cold symptoms  Treat your symptoms   If throat gets much worse or you cannot swallow please call  If any trouble breathing- please go to the ER

## 2022-01-09 NOTE — Assessment & Plan Note (Signed)
Negative RST and neg flu test  With low grade fever  Had covid about 2 weeks prior and recovered   Reassuring exam  Suspect this is either viral pharyngitis or early viral uri  Will watch for congestion or cough  inst to call if worse st or trouble swallowing Update if not starting to improve in a week or if worsening  ER precautions noted

## 2022-01-09 NOTE — Progress Notes (Signed)
Subjective:    Patient ID: Caitlyn Harris, female    DOB: 1980-05-04, 41 y.o.   MRN: [id number]  HPI Pt presents for sore throat and fever   Wt Readings from Last 3 Encounters:  01/09/22 268 lb (121.6 kg)  12/27/21 265 lb (120.2 kg)  12/19/21 267 lb 4 oz (121.2 kg)   35.36 kg/m  [facility] for sore throat and uri symptoms on 9/22  Throat culture was negative along with rapid strep Covid test was positive  Flu negative   She got better  Returned to work last Friday   Yesterday felt run down  Felt some sore throat  Went to bed early  Woke up in middle of the night  Low grade temp 100.9 Body aches  Throat is sore / neck feels sore also   Some phlegm in throat  No congestion /no runny nose No cough  No wheezing Perhaps a little more sob than usual   Has her usual headaches   Otc: Alternates ibuprofen and tylenol [place] lozenges   Results for orders placed or performed in visit on 01/09/22  Rapid Strep A  Result Value Ref Range   Rapid Strep A Screen Negative Negative  POCT Influenza A/B  Result Value Ref Range   Influenza A, POC Negative Negative   Influenza B, POC Negative Negative     Patient Active Problem List   Diagnosis Date Noted   URI (upper respiratory infection) 01/09/2022   Drug reaction 12/19/2021   Low back pain 12/19/2021   Pelvic lump 11/26/2021   MDD (major depressive disorder), recurrent severe, without psychosis ([place]) 11/15/2021   Bereavement 11/15/2021   Anxiety 08/21/2021   Attention and concentration deficit 08/21/2021   Cervical lymphadenopathy 07/09/2021   Nausea 07/07/2021   Abdominal pain, generalized 06/26/2021   Bloating 06/26/2021   Diarrhea 06/26/2021   Heartburn 06/26/2021   Sore throat 02/06/2021   Nasal congestion 02/06/2021   Obesity (BMI 30-39.9) 04/16/2020   Hypertriglyceridemia 06/11/2018   Endometriosis 02/01/2018   Chronic female pelvic pain 02/01/2018   Pelvic pain 01/18/2018   History of  endometriosis 01/18/2018   Rectocele 01/18/2018   Insomnia 08/25/2017   Cystocele, midline 70/04/7492   Umbilical hernia without obstruction and without gangrene    Chronic migraine without aura without status migrainosus, not intractable 02/04/2016   Depression 02/04/2016   GAD (generalized anxiety disorder) 02/04/2016   Vitamin D deficiency, unspecified 02/04/2016   Past Medical History:  Diagnosis Date   Anxiety    Depression    Endometriosis    GERD (gastroesophageal reflux disease)    OCC   History of PCOS    Hyperlipidemia    Migraine    Seizures ([place])    FEBRILE AS A BABY   Past Surgical History:  Procedure Laterality Date   ABDOMINAL HYSTERECTOMY     CERVICAL BIOPSY  W/ LOOP ELECTRODE EXCISION     ECTOPIC PREGNANCY SURGERY     endometirosis     HAND SURGERY     THUMB SURGERY   LAPAROSCOPIC OVARIAN CYSTECTOMY Left 03/02/2018   Procedure: LAPAROSCOPIC RIGHT OVARIAN CYSTECTOMY;  Surgeon: Gae Dry, MD;  Location: [facility];  Service: Gynecology;  Laterality: Left;   LAPAROSCOPIC OVARIAN CYSTECTOMY Left 08/30/2019   Procedure: LAPAROSCOPIC OVARIAN CYSTECTOMY;  Surgeon: Gae Dry, MD;  Location: [facility];  Service: Gynecology;  Laterality: Left;   LEEP     LEEP     PERINEOPLASTY N/A 03/02/2018  Procedure: PERINEORRHAPHY;  Surgeon: Gae Dry, MD;  Location: [facility];  Service: Gynecology;  Laterality: N/A;   RECTOCELE REPAIR N/A 03/02/2018   Procedure: POSTERIOR REPAIR (RECTOCELE);  Surgeon: Gae Dry, MD;  Location: [facility];  Service: Gynecology;  Laterality: N/A;   VENTRAL HERNIA REPAIR N/A 03/19/2017   Procedure: HERNIA REPAIR VENTRAL ADULT;  Surgeon: Florene Glen, MD;  Location: [facility];  Service: General;  Laterality: N/A;   Social History   Tobacco Use   Smoking status: Former    Years: 7.00    Types: Cigarettes    Quit date: 03/12/2009    Years since quitting: 12.8   Smokeless tobacco: Never   Tobacco comments:    1 PACK  EVERY 2 WEEKS  Vaping Use   Vaping Use: Never used  Substance Use Topics   Alcohol use: Not Currently    Comment: RARE   Drug use: No   Family History  Problem Relation Age of Onset   Hypertension Mother    Diabetes Mother    Renal cancer Father    Hypertension Father    Post-traumatic stress disorder Brother    Drug abuse Maternal Uncle    Alcohol abuse Maternal Uncle    Bipolar disorder Half-Sister    Bipolar disorder Niece    Suicidality Other    Allergies  Allergen Reactions   Tape     Low sensitivity to tape, ok with Tegaderm and paper tape    Current Outpatient Medications on File Prior to Visit  Medication Sig Dispense Refill   acetaminophen (TYLENOL) 500 MG tablet Take 1,000 mg by mouth every 6 (six) hours as needed for moderate pain or headache.     botulinum toxin Type A (BOTOX) 200 units injection Inject 155 units IM into multiple site in the face,neck and head once every 90 days 1 each 4   clonazePAM (KLONOPIN) 0.5 MG tablet Take 0.5-1 tablets (0.25-0.5 mg total) by mouth daily as needed for anxiety. Please limit use 10 tablet 0   diphenhydrAMINE (BENADRYL) 25 MG tablet Take 25 mg by mouth every 6 (six) hours as needed.     fluticasone (FLONASE) 50 MCG/ACT nasal spray Place 2 sprays into both nostrils daily. 16 g 0   hydrOXYzine (VISTARIL) 25 MG capsule TAKE 1-2 CAPSULES (25-50 MG TOTAL) BY MOUTH AT BEDTIME AS NEEDED. FOR SLEEP AND ANXIETY 180 capsule 0   ibuprofen (ADVIL) 400 MG tablet Take 400 mg by mouth every 6 (six) hours as needed.     omeprazole (PRILOSEC) 10 MG capsule Take 10 mg by mouth daily.     ondansetron (ZOFRAN-ODT) 8 MG disintegrating tablet Take 1 tablet (8 mg total) by mouth every 8 (eight) hours as needed for nausea or vomiting. 20 tablet 0   SUMAtriptan (IMITREX) 100 MG tablet MAY REPEAT IN 2 HOURS IF HEADACHE PERSISTS OR RECURS. 30 tablet 3   tiZANidine (ZANAFLEX) 4 MG capsule TAKE 1 CAPSULE BY MOUTH 3 TIMES DAILY AS NEEDED FOR MUSCLE SPASMS. 30  capsule 3   topiramate (TOPAMAX) 100 MG tablet TAKE 1 TABLET BY MOUTH TWICE A DAY 180 tablet 3   No current facility-administered medications on file prior to visit.     Review of Systems  Constitutional:  Positive for fatigue and fever. Negative for activity change, appetite change and unexpected weight change.  HENT:  Positive for sore throat and voice change. Negative for congestion, ear pain, rhinorrhea, sinus pressure and trouble swallowing.   Eyes:  Negative for  pain, redness and visual disturbance.  Respiratory:  Negative for cough, shortness of breath and wheezing.   Cardiovascular:  Negative for chest pain and palpitations.  Gastrointestinal:  Negative for abdominal pain, blood in stool, constipation and diarrhea.  Endocrine: Negative for polydipsia and polyuria.  Genitourinary:  Negative for dysuria, frequency and urgency.  Musculoskeletal:  Negative for arthralgias, back pain and myalgias.  Skin:  Negative for pallor and rash.  Allergic/Immunologic: Negative for environmental allergies.  Neurological:  Positive for headaches. Negative for dizziness and syncope.  Hematological:  Negative for adenopathy. Does not bruise/bleed easily.  Psychiatric/Behavioral:  Negative for decreased concentration and dysphoric mood. The patient is not nervous/anxious.        Objective:   Physical Exam Constitutional:      General: She is not in acute distress.    Appearance: Normal appearance. She is well-developed. She is obese. She is not ill-appearing, toxic-appearing or diaphoretic.  HENT:     Head: Normocephalic and atraumatic.     Comments: Nares are injected and congested      Right Ear: Tympanic membrane, ear canal and external ear normal.     Left Ear: Tympanic membrane, ear canal and external ear normal.     Nose: No congestion or rhinorrhea.     Comments: Nares are boggy    Mouth/Throat:     Mouth: Mucous membranes are moist.     Pharynx: Oropharynx is clear. No oropharyngeal  exudate or posterior oropharyngeal erythema.     Comments: Clear pnd /mild  Slight erythema posteriorly  No swelling  No lesions Eyes:     General:        Right eye: No discharge.        Left eye: No discharge.     Conjunctiva/sclera: Conjunctivae normal.     Pupils: Pupils are equal, round, and reactive to light.  Cardiovascular:     Rate and Rhythm: Normal rate.     Heart sounds: Normal heart sounds.  Pulmonary:     Effort: Pulmonary effort is normal. No respiratory distress.     Breath sounds: Normal breath sounds. No stridor. No wheezing, rhonchi or rales.  Chest:     Chest wall: No tenderness.  Musculoskeletal:     Cervical back: Normal range of motion and neck supple. No tenderness.  Lymphadenopathy:     Cervical: No cervical adenopathy.  Skin:    General: Skin is warm and dry.     Capillary Refill: Capillary refill takes less than 2 seconds.     Findings: No rash.  Neurological:     Mental Status: She is alert.     Cranial Nerves: No cranial nerve deficit.  Psychiatric:        Mood and Affect: Mood normal.           Assessment & Plan:   Problem List Items Addressed This Visit       Respiratory   URI (upper respiratory infection)   Relevant Orders   POCT Influenza A/B (Completed)     Other   Sore throat - Primary    Negative RST and neg flu test  With low grade fever  Had covid about 2 weeks prior and recovered   Reassuring exam  Suspect this is either viral pharyngitis or early viral uri  Will watch for congestion or cough  inst to call if worse st or trouble swallowing Update if not starting to improve in a week or if worsening  ER precautions noted  Relevant Orders   Rapid Strep A (Completed)   POCT Influenza A/B (Completed)

## 2022-01-13 ENCOUNTER — Other Ambulatory Visit: Payer: Self-pay

## 2022-01-13 ENCOUNTER — Encounter: Payer: Self-pay | Admitting: Gastroenterology

## 2022-01-13 ENCOUNTER — Ambulatory Visit: Payer: BC Managed Care – PPO | Admitting: Gastroenterology

## 2022-01-13 VITALS — BP 126/88 | HR 111 | Temp 98.5°F | Ht 73.0 in | Wt 266.6 lb

## 2022-01-13 DIAGNOSIS — R1013 Epigastric pain: Secondary | ICD-10-CM

## 2022-01-13 DIAGNOSIS — K921 Melena: Secondary | ICD-10-CM

## 2022-01-13 DIAGNOSIS — R197 Diarrhea, unspecified: Secondary | ICD-10-CM

## 2022-01-13 DIAGNOSIS — K219 Gastro-esophageal reflux disease without esophagitis: Secondary | ICD-10-CM

## 2022-01-13 MED ORDER — OMEPRAZOLE 20 MG PO CPDR
20.0000 mg | DELAYED_RELEASE_CAPSULE | Freq: Two times a day (BID) | ORAL | 2 refills | Status: DC
Start: 2022-01-13 — End: 2022-03-25

## 2022-01-13 MED ORDER — NA SULFATE-K SULFATE-MG SULF 17.5-3.13-1.6 GM/177ML PO SOLN
354.0000 mL | Freq: Once | ORAL | 0 refills | Status: AC
Start: 2022-01-13 — End: 2022-01-13

## 2022-01-13 NOTE — Patient Instructions (Signed)
Wedge pillow to help with your GERD.   Food Choices for Gastroesophageal Reflux Disease, Adult When you have gastroesophageal reflux disease (GERD), the foods you eat and your eating habits are very important. Choosing the right foods can help ease your discomfort. Think about working with a food expert (dietitian) to help you make good choices. What are tips for following this plan? Reading food labels Look for foods that are low in saturated fat. Foods that may help with your symptoms include: Foods that have less than 5% of daily value (DV) of fat. Foods that have 0 grams of trans fat. Cooking Do not fry your food. Cook your food by baking, steaming, grilling, or broiling. These are all methods that do not need a lot of fat for cooking. To add flavor, try to use herbs that are low in spice and acidity. Meal planning  Choose healthy foods that are low in fat, such as: Fruits and vegetables. Whole grains. Low-fat dairy products. Lean meats, fish, and poultry. Eat small meals often instead of eating 3 large meals each day. Eat your meals slowly in a place where you are relaxed. Avoid bending over or lying down until 2-3 hours after eating. Limit high-fat foods such as fatty meats or fried foods. Limit your intake of fatty foods, such as oils, butter, and shortening. Avoid the following as told by your doctor: Foods that cause symptoms. These may be different for different people. Keep a food diary to keep track of foods that cause symptoms. Alcohol. Drinking a lot of liquid with meals. Eating meals during the 2-3 hours before bed. Lifestyle Stay at a healthy weight. Ask your doctor what weight is healthy for you. If you need to lose weight, work with your doctor to do so safely. Exercise for at least 30 minutes on 5 or more days each week, or as told by your doctor. Wear loose-fitting clothes. Do not smoke or use any products that contain nicotine or tobacco. If you need help  quitting, ask your doctor. Sleep with the head of your bed higher than your feet. Use a wedge under the mattress or blocks under the bed frame to raise the head of the bed. Chew sugar-free gum after meals. What foods should eat?  Eat a healthy, well-balanced diet of fruits, vegetables, whole grains, low-fat dairy products, lean meats, fish, and poultry. Each person is different. Foods that may cause symptoms in one person may not cause any symptoms in another person. Work with your doctor to find foods that are safe for you. The items listed above may not be a complete list of what you can eat and drink. Contact a food expert for more options. What foods should I avoid? Limiting some of these foods may help in managing the symptoms of GERD. Everyone is different. Talk with a food expert or your doctor to help you find the exact foods to avoid, if any. Fruits Any fruits prepared with added fat. Any fruits that cause symptoms. For some people, this may include citrus fruits, such as oranges, grapefruit, pineapple, and lemons. Vegetables Deep-fried vegetables. [place] fries. Any vegetables prepared with added fat. Any vegetables that cause symptoms. For some people, this may include tomatoes and tomato products, chili peppers, onions and garlic, and horseradish. Grains Pastries or quick breads with added fat. Meats and other proteins High-fat meats, such as fatty beef or pork, hot dogs, ribs, ham, sausage, salami, and bacon. Fried meat or protein, including fried fish and fried chicken.  Nuts and nut butters, in large amounts. Dairy Whole milk and chocolate milk. Sour cream. Cream. Ice cream. Cream cheese. Milkshakes. Fats and oils Butter. Margarine. Shortening. Ghee. Beverages Coffee and tea, with or without caffeine. Carbonated beverages. Sodas. Energy drinks. Fruit juice made with acidic fruits, such as orange or grapefruit. Tomato juice. Alcoholic drinks. Sweets and desserts Chocolate and  cocoa. Donuts. Seasonings and condiments Pepper. Peppermint and spearmint. Added salt. Any condiments, herbs, or seasonings that cause symptoms. For some people, this may include curry, hot sauce, or vinegar-based salad dressings. The items listed above may not be a complete list of what you should not eat and drink. Contact a food expert for more options. Questions to ask your doctor Diet and lifestyle changes are often the first steps that are taken to manage symptoms of GERD. If diet and lifestyle changes do not help, talk with your doctor about taking medicines. Where to find more information International Foundation for Gastrointestinal Disorders: aboutgerd.org Summary When you have GERD, food and lifestyle choices are very important in easing your symptoms. Eat small meals often instead of 3 large meals a day. Eat your meals slowly and in a place where you are relaxed. Avoid bending over or lying down until 2-3 hours after eating. Limit high-fat foods such as fatty meats or fried foods. This information is not intended to replace advice given to you by your health care provider. Make sure you discuss any questions you have with your health care provider. Document Revised: 10/03/2019 Document Reviewed: 10/03/2019 Elsevier Patient Education  [place].

## 2022-01-13 NOTE — Progress Notes (Signed)
Jonathon Bellows MD, MRCP(U.K) [address]  [place]  [place], [place] [address]  Main: [phone]  Fax: [phone]   Gastroenterology Consultation  Referring Provider:     Abner Greenspan, MD Primary Care Physician:  Tower, Wynelle Fanny, MD Primary Gastroenterologist:  Dr. Jonathon Bellows  Reason for Consultation: Nausea , heartburn and bloating         HPI:   Caitlyn Harris is a 41 y.o. y/o female referred for consultation & management  by Dr. Glori Bickers, Wynelle Fanny, MD.   Seen in the ER on 11/24/2021 for abdominal pain.  Mention in the ER that she had blood on the tissue paper  07/04/2021 right upper quadrant ultrasound shows spleen measures 13 cm thin splenomegaly 11/24/2021 CT abdomen hepatic steatosis spleen, is a normal size 11/24/2021 hemoglobin 13.2 g, CMP normal.   She says that she has had GI symptoms ongoing for many months if not longer.  Complains of nonspecific abdominal discomfort associated with consumption of certain foods ranging from labs to daily.  Describes her symptoms of cramping in the left side of her abdomen describes it as a knot sometimes radiating to the right side of her abdomen.  Sometimes relieved with a bowel movement sometimes not relieved with a bowel movement.  Previously had 1-2 bowel movements a day watery occasional rectal bleeding since 1 week having 6-7 bowel movements a day pronounced rectal bleeding bright red in color.  Denies any regular NSAID use.  She has been having reflux of late with symptoms predominantly of heartburn waking her up at night.  Has gained weight recently.  She has been taking omeprazole over-the-counter as needed with her meals  Past Medical History:  Diagnosis Date   Anxiety    Depression    Endometriosis    GERD (gastroesophageal reflux disease)    OCC   History of PCOS    Hyperlipidemia    Migraine    Seizures ([place])    FEBRILE AS A BABY    Past Surgical History:  Procedure Laterality Date   ABDOMINAL HYSTERECTOMY      CERVICAL BIOPSY  W/ LOOP ELECTRODE EXCISION     ECTOPIC PREGNANCY SURGERY     endometirosis     HAND SURGERY     THUMB SURGERY   LAPAROSCOPIC OVARIAN CYSTECTOMY Left 03/02/2018   Procedure: LAPAROSCOPIC RIGHT OVARIAN CYSTECTOMY;  Surgeon: Gae Dry, MD;  Location: [facility];  Service: Gynecology;  Laterality: Left;   LAPAROSCOPIC OVARIAN CYSTECTOMY Left 08/30/2019   Procedure: LAPAROSCOPIC OVARIAN CYSTECTOMY;  Surgeon: Gae Dry, MD;  Location: [facility];  Service: Gynecology;  Laterality: Left;   LEEP     LEEP     PERINEOPLASTY N/A 03/02/2018   Procedure: PERINEORRHAPHY;  Surgeon: Gae Dry, MD;  Location: [facility];  Service: Gynecology;  Laterality: N/A;   RECTOCELE REPAIR N/A 03/02/2018   Procedure: POSTERIOR REPAIR (RECTOCELE);  Surgeon: Gae Dry, MD;  Location: [facility];  Service: Gynecology;  Laterality: N/A;   VENTRAL HERNIA REPAIR N/A 03/19/2017   Procedure: HERNIA REPAIR VENTRAL ADULT;  Surgeon: Florene Glen, MD;  Location: [facility];  Service: General;  Laterality: N/A;    Prior to Admission medications   Medication Sig Start Date End Date Taking? Authorizing Provider  acetaminophen (TYLENOL) 500 MG tablet Take 1,000 mg by mouth every 6 (six) hours as needed for moderate pain or headache.    [provider]  botulinum toxin Type A (BOTOX) 200 units  injection Inject 155 units IM into multiple site in the face,neck and head once every 90 days 07/11/21   Pieter Partridge, DO  citalopram (CELEXA) 40 MG tablet Take 40 mg by mouth daily. 01/06/22   [provider]  clonazePAM (KLONOPIN) 0.5 MG tablet Take 0.5-1 tablets (0.25-0.5 mg total) by mouth daily as needed for anxiety. Please limit use 12/20/21   Ursula Alert, MD  diphenhydrAMINE (BENADRYL) 25 MG tablet Take 25 mg by mouth every 6 (six) hours as needed.    [provider]  fluticasone (FLONASE) 50 MCG/ACT nasal spray Place 2 sprays into both nostrils daily. 02/18/21   Michela Pitcher, NP  hydrOXYzine (VISTARIL) 25 MG capsule TAKE 1-2 CAPSULES (25-50 MG TOTAL) BY MOUTH AT BEDTIME AS NEEDED. FOR SLEEP AND ANXIETY 12/09/21   Ursula Alert, MD  ibuprofen (ADVIL) 400 MG tablet Take 400 mg by mouth every 6 (six) hours as needed.    [provider]  omeprazole (PRILOSEC) 10 MG capsule Take 10 mg by mouth daily.    [provider]  ondansetron (ZOFRAN-ODT) 8 MG disintegrating tablet Take 1 tablet (8 mg total) by mouth every 8 (eight) hours as needed for nausea or vomiting. 12/27/21   Jaynee Eagles, PA-C  phenazopyridine (PYRIDIUM) 200 MG tablet Take 200 mg by mouth 3 (three) times daily as needed. 01/03/22   [provider]  SUMAtriptan (IMITREX) 100 MG tablet MAY REPEAT IN 2 HOURS IF HEADACHE PERSISTS OR RECURS. 06/24/21   Tower, Marne A, MD  tiZANidine (ZANAFLEX) 4 MG capsule TAKE 1 CAPSULE BY MOUTH 3 TIMES DAILY AS NEEDED FOR MUSCLE SPASMS. 12/19/21   Tower, Wynelle Fanny, MD  topiramate (TOPAMAX) 100 MG tablet TAKE 1 TABLET BY MOUTH TWICE A DAY 01/06/22   Tower, Wynelle Fanny, MD    Family History  Problem Relation Age of Onset   Hypertension Mother    Diabetes Mother    Renal cancer Father    Hypertension Father    Post-traumatic stress disorder Brother    Drug abuse Maternal Uncle    Alcohol abuse Maternal Uncle    Bipolar disorder Half-Sister    Bipolar disorder Niece    Suicidality Other      Social History   Tobacco Use   Smoking status: Former    Years: 7.00    Types: Cigarettes    Quit date: 03/12/2009    Years since quitting: 12.8   Smokeless tobacco: Never   Tobacco comments:    1 PACK EVERY 2 WEEKS  Vaping Use   Vaping Use: Never used  Substance Use Topics   Alcohol use: Not Currently    Comment: RARE   Drug use: No    Allergies as of 01/13/2022 - Review Complete 01/13/2022  Allergen Reaction Noted   Tape  08/24/2019    Review of Systems:    All systems reviewed and negative except where noted in HPI.   Physical Exam:  BP  126/88   Pulse (!) 111   Temp 98.5 F (36.9 C) (Oral)   Ht '6\' 1"'$  (1.854 m)   Wt 266 lb 9.6 oz (120.9 kg)   BMI 35.17 kg/m  No LMP recorded. Patient has had a hysterectomy. Psych:  Alert and cooperative. Normal mood and affect. General:   Alert,  Well-developed, well-nourished, pleasant and cooperative in NAD Head:  Normocephalic and atraumatic. Eyes:  Sclera clear, no icterus.   Conjunctiva pink. Ears:  Normal auditory acuity. Neck:  Supple; no masses or thyromegaly.  Lungs:  Respirations even and unlabored.  Clear throughout to auscultation.   No wheezes, crackles, or rhonchi. No acute distress. Heart:  Regular rate and rhythm; no murmurs, clicks, rubs, or gallops. Abdomen:  Normal bowel sounds.  No bruits.  Soft, non-tender and non-distended without masses, hepatosplenomegaly or hernias noted.  No guarding or rebound tenderness.    Neurologic:  Alert and oriented x3;  grossly normal neurologically. Psych:  Alert and cooperative. Normal mood and affect.  Imaging Studies: No results found.  Assessment and Plan:   Caitlyn Harris is a 41 y.o. y/o female has been referred for multitude of symptoms including heartburn ongoing for a year recent weight gain suggestive of reflux.  Dyspeptic symptoms, food intolerances some features suggestive of IBS diarrhea rec, rectal bleeding and worsening of her diarrhea since 1 week.  Differentials for this include IBS diarrhea versus inflammatory bowel disease.   Plan 1.  High-fiber diet 2.  Colonoscopy to evaluate for rectal bleeding, EGD to evaluate for dyspeptic symptoms 3.  H. pylori breath test 4.  No NSAIDs 5.  Trial of PPI 40 mg a day 6 weeks 6.  GERD patient information provided counseled about lifestyle changes advised use a wedge pillow 7.  At next visit if symptoms persist will get alpha gal testing and food allergy profile.   I have discussed alternative options, risks & benefits,  which include, but are not limited to, bleeding,  infection, perforation,respiratory complication & drug reaction.  The patient agrees with this plan & written consent will be obtained.    Follow up in 6 to 8 weeks  Dr Jonathon Bellows MD,MRCP(U.K)

## 2022-01-15 LAB — CELIAC DISEASE AB SCREEN W/RFX
Antigliadin Abs, IgA: 6 units (ref 0–19)
IgA/Immunoglobulin A, Serum: 120 mg/dL (ref 87–352)
Transglutaminase IgA: <2 U/mL (ref 0–3)

## 2022-01-15 LAB — H. PYLORI BREATH TEST: H pylori Breath Test: NEGATIVE

## 2022-01-16 ENCOUNTER — Ambulatory Visit: Payer: BC Managed Care – PPO | Admitting: Anesthesiology

## 2022-01-16 ENCOUNTER — Encounter
Admission: RE | Disposition: A | Discharge status: Home / Self Care | Payer: Self-pay | Source: Ambulatory Visit | Attending: Gastroenterology

## 2022-01-16 ENCOUNTER — Encounter: Payer: Self-pay | Admitting: Gastroenterology

## 2022-01-16 ENCOUNTER — Ambulatory Visit
Admission: RE | Admit: 2022-01-16 | Discharge: 2022-01-16 | Disposition: A | Discharge status: Home / Self Care | Payer: BC Managed Care – PPO | Source: Ambulatory Visit | Attending: Gastroenterology | Admitting: Gastroenterology

## 2022-01-16 DIAGNOSIS — K219 Gastro-esophageal reflux disease without esophagitis: Secondary | ICD-10-CM

## 2022-01-16 DIAGNOSIS — E669 Obesity, unspecified: Secondary | ICD-10-CM | POA: Insufficient documentation

## 2022-01-16 DIAGNOSIS — R197 Diarrhea, unspecified: Secondary | ICD-10-CM

## 2022-01-16 DIAGNOSIS — K921 Melena: Secondary | ICD-10-CM

## 2022-01-16 DIAGNOSIS — F418 Other specified anxiety disorders: Secondary | ICD-10-CM | POA: Diagnosis not present

## 2022-01-16 DIAGNOSIS — K529 Noninfective gastroenteritis and colitis, unspecified: Secondary | ICD-10-CM | POA: Diagnosis present

## 2022-01-16 DIAGNOSIS — D126 Benign neoplasm of colon, unspecified: Secondary | ICD-10-CM

## 2022-01-16 DIAGNOSIS — R519 Headache, unspecified: Secondary | ICD-10-CM | POA: Diagnosis not present

## 2022-01-16 DIAGNOSIS — Z87891 Personal history of nicotine dependence: Secondary | ICD-10-CM | POA: Diagnosis not present

## 2022-01-16 DIAGNOSIS — R1013 Epigastric pain: Secondary | ICD-10-CM

## 2022-01-16 DIAGNOSIS — Z6834 Body mass index (BMI) 34.0-34.9, adult: Secondary | ICD-10-CM | POA: Insufficient documentation

## 2022-01-16 HISTORY — PX: COLONOSCOPY WITH PROPOFOL: SHX5780

## 2022-01-16 HISTORY — PX: ESOPHAGOGASTRODUODENOSCOPY (EGD) WITH PROPOFOL: SHX5813

## 2022-01-16 SURGERY — COLONOSCOPY WITH PROPOFOL
Anesthesia: General

## 2022-01-16 MED ORDER — PROPOFOL 1000 MG/100ML IV EMUL
INTRAVENOUS | Status: AC
  Filled 2022-01-16: qty 100

## 2022-01-16 MED ORDER — MIDAZOLAM HCL 2 MG/2ML IJ SOLN
INTRAMUSCULAR | Status: AC
  Filled 2022-01-16: qty 2

## 2022-01-16 MED ORDER — PROPOFOL 10 MG/ML IV BOLUS
INTRAVENOUS | Status: DC | PRN
  Administered 2022-01-16: 60 mg via INTRAVENOUS

## 2022-01-16 MED ORDER — STERILE WATER FOR IRRIGATION IR SOLN
Status: DC | PRN
  Administered 2022-01-16: 120 mL

## 2022-01-16 MED ORDER — LIDOCAINE HCL (CARDIAC) PF 100 MG/5ML IV SOSY
PREFILLED_SYRINGE | INTRAVENOUS | Status: DC | PRN
  Administered 2022-01-16: 50 mg via INTRAVENOUS

## 2022-01-16 MED ORDER — SODIUM CHLORIDE 0.9 % IV SOLN: INTRAVENOUS | Status: DC

## 2022-01-16 MED ORDER — PROPOFOL 500 MG/50ML IV EMUL
INTRAVENOUS | Status: DC | PRN
  Administered 2022-01-16: 150 ug/kg/min via INTRAVENOUS

## 2022-01-16 NOTE — Anesthesia Preprocedure Evaluation (Addendum)
Anesthesia Evaluation  Patient identified by MRN, date of birth, ID band Patient awake    Reviewed: Allergy & Precautions, NPO status , Patient's Chart, lab work & pertinent test results  History of Anesthesia Complications Negative for: history of anesthetic complications  Airway Mallampati: III  TM Distance: >3 FB Neck ROM: Full    Dental no notable dental hx.    Pulmonary neg sleep apnea, neg COPD, former smoker,    breath sounds clear to auscultation- rhonchi (-) wheezing      Cardiovascular (-) hypertension(-) CAD, (-) Past MI, (-) Cardiac Stents and (-) CABG  Rhythm:Regular Rate:Normal - Systolic murmurs and - Diastolic murmurs    Neuro/Psych  Headaches, neg Seizures PSYCHIATRIC DISORDERS Anxiety Depression    GI/Hepatic Neg liver ROS, GERD  ,  Endo/Other  negative endocrine ROSneg diabetes  Renal/GU negative Renal ROS     Musculoskeletal negative musculoskeletal ROS (+)   Abdominal (+) + obese,   Peds  Hematology negative hematology ROS (+)   Anesthesia Other Findings Past Medical History: No date: Anxiety No date: Depression No date: GERD (gastroesophageal reflux disease)     Comment:  OCC No date: Hyperlipidemia No date: Migraine No date: Seizures (HCC)     Comment:  FEBRILE AS A BABY   Reproductive/Obstetrics                            Anesthesia Physical  Anesthesia Plan  ASA: II  Anesthesia Plan: General   Post-op Pain Management:    Induction: Intravenous  PONV Risk Score and Plan: 2 and Propofol infusion and TIVA  Airway Management Planned: Natural Airway  Additional Equipment:   Intra-op Plan:   Post-operative Plan:   Informed Consent: I have reviewed the patients History and Physical, chart, labs and discussed the procedure including the risks, benefits and alternatives for the proposed anesthesia with the patient or authorized representative who has  indicated his/her understanding and acceptance.     Dental advisory given  Plan Discussed with: CRNA and Anesthesiologist  Anesthesia Plan Comments:       Anesthesia Quick Evaluation

## 2022-01-16 NOTE — Transfer of Care (Signed)
Immediate Anesthesia Transfer of Care Note  Patient: Caitlyn Harris  Procedure(s) Performed: COLONOSCOPY WITH PROPOFOL ESOPHAGOGASTRODUODENOSCOPY (EGD) WITH PROPOFOL  Patient Location: PACU and Endoscopy Unit  Anesthesia Type:General  Level of Consciousness: awake, drowsy and patient cooperative  Airway & Oxygen Therapy: Patient Spontanous Breathing  Post-op Assessment: Report given to RN and Post -op Vital signs reviewed and stable  Post vital signs: Reviewed and stable  Last Vitals:  Vitals Value Taken Time  BP 113/80 01/16/22 0940  Temp    Pulse 90 01/16/22 0941  Resp 19 01/16/22 0941  SpO2 100 % 01/16/22 0941  Vitals shown include unvalidated device data.  Last Pain:  Vitals:   01/16/22 0813  PainSc: 0-No pain         Complications: No notable events documented.

## 2022-01-16 NOTE — Op Note (Signed)
[facility] Gastroenterology Patient Name: Caitlyn Harris Procedure Date: 01/16/2022 9:00 AM MRN: [id number] Account #: [id number] Date of Birth: 21-Jul-1980 Admit Type: Outpatient Age: 41 Room: 4 Gender: Female Note Status: Finalized Instrument Name: Upper Endoscope [id number] Procedure:             Upper GI endoscopy Indications:           Dyspepsia Providers:             Jonathon Bellows MD, MD Referring MD:          Wynelle Fanny. Tower (Referring MD) Medicines:             Monitored Anesthesia Care Complications:         No immediate complications. Procedure:             Pre-Anesthesia Assessment:                        - Prior to the procedure, a History and Physical was                         performed, and patient medications, allergies and                         sensitivities were reviewed. The patient's tolerance                         of previous anesthesia was reviewed.                        - The risks and benefits of the procedure and the                         sedation options and risks were discussed with the                         patient. All questions were answered and informed                         consent was obtained.                        - After reviewing the risks and benefits, the patient                         was deemed in satisfactory condition to undergo the                         procedure.                        - ASA Grade Assessment: II - A patient with mild                         systemic disease.                        After obtaining informed consent, the endoscope was                         passed under direct vision. Throughout the procedure,  the patient's blood pressure, pulse, and oxygen                         saturations were monitored continuously. The Endoscope                         was introduced through the mouth, and advanced to the                         third part of duodenum. The upper  GI endoscopy was                         accomplished with ease. The patient tolerated the                         procedure well. Findings:      The esophagus was normal.      The examined duodenum was normal.      The entire examined stomach was normal. Biopsies were taken with a cold       forceps for histology.      The cardia and gastric fundus were normal on retroflexion. Impression:            - Normal esophagus.                        - Normal examined duodenum.                        - Normal stomach. Biopsied. Recommendation:        - Await pathology results.                        - Perform a colonoscopy today. Procedure Code(s):     --- Professional ---                        [phone], Esophagogastroduodenoscopy, flexible,                         transoral; with biopsy, single or multiple Diagnosis Code(s):     --- Professional ---                        R10.13, Epigastric pain CPT copyright 2019 American Medical Association. All rights reserved. The codes documented in this report are preliminary and upon coder review may  be revised to meet current compliance requirements. Jonathon Bellows, MD Jonathon Bellows MD, MD 01/16/2022 9:15:01 AM This report has been signed electronically. Number of Addenda: 0 Note Initiated On: 01/16/2022 9:00 AM Estimated Blood Loss:  Estimated blood loss: none.      [facility]

## 2022-01-16 NOTE — Anesthesia Procedure Notes (Signed)
Procedure Name: MAC Date/Time: 01/16/2022 9:10 AM  Performed by: Jerrye Noble, CRNAPre-anesthesia Checklist: Patient identified, Emergency Drugs available, Suction available and Patient being monitored Patient Re-evaluated:Patient Re-evaluated prior to induction Oxygen Delivery Method: Nasal cannula

## 2022-01-16 NOTE — Anesthesia Postprocedure Evaluation (Signed)
Anesthesia Post Note  Patient: Caitlyn Harris  Procedure(s) Performed: COLONOSCOPY WITH PROPOFOL ESOPHAGOGASTRODUODENOSCOPY (EGD) WITH PROPOFOL  Patient location during evaluation: Endoscopy Anesthesia Type: General Level of consciousness: awake and alert Pain management: pain level controlled Vital Signs Assessment: post-procedure vital signs reviewed and stable Respiratory status: spontaneous breathing, nonlabored ventilation and respiratory function stable Cardiovascular status: blood pressure returned to baseline and stable Postop Assessment: no apparent nausea or vomiting Anesthetic complications: no   No notable events documented.   Last Vitals:  Vitals:   01/16/22 0951 01/16/22 1001  BP: (!) 117/90 115/81  Pulse: 78 83  Resp: 17 16  Temp:    SpO2: 100% 100%    Last Pain:  Vitals:   01/16/22 1001  TempSrc:   PainSc: 0-No pain                 [place] [facility]

## 2022-01-16 NOTE — Op Note (Signed)
[facility] Gastroenterology Patient Name: Caitlyn Harris Procedure Date: 01/16/2022 8:59 AM MRN: [id number] Account #: [id number] Date of Birth: Sep 10, 1980 Admit Type: Outpatient Age: 41 Room: [facility] ENDO ROOM 4 Gender: Female Note Status: Finalized Instrument Name: Park Meo [id number] Procedure:             Colonoscopy Indications:           Chronic diarrhea Providers:             Jonathon Bellows MD, MD Referring MD:          Wynelle Fanny. Tower (Referring MD) Medicines:             Monitored Anesthesia Care Complications:         No immediate complications. Procedure:             Pre-Anesthesia Assessment:                        - Prior to the procedure, a History and Physical was                         performed, and patient medications, allergies and                         sensitivities were reviewed. The patient's tolerance                         of previous anesthesia was reviewed.                        - The risks and benefits of the procedure and the                         sedation options and risks were discussed with the                         patient. All questions were answered and informed                         consent was obtained.                        - ASA Grade Assessment: II - A patient with mild                         systemic disease.                        After obtaining informed consent, the colonoscope was                         passed under direct vision. Throughout the procedure,                         the patient's blood pressure, pulse, and oxygen                         saturations were monitored continuously. The                         Colonoscope was introduced through the anus  and                         advanced to the the terminal ileum. The colonoscopy                         was performed with ease. The patient tolerated the                         procedure well. The quality of the bowel preparation                          was excellent. Findings:      The perianal and digital rectal examinations were normal.      The terminal ileum appeared normal. Biopsies were taken with a cold       forceps for histology.      A 5 mm polyp was found in the rectum. The polyp was sessile. The polyp       was removed with a cold snare. Resection was complete, but the polyp       tissue was not retrieved.      Normal mucosa was found in the entire colon. Biopsies were taken with a       cold forceps for histology. To prevent bleeding after the biopsy, two       hemostatic clips were successfully placed. There was no bleeding at the       end of the maneuver.      The exam was otherwise without abnormality on direct and retroflexion       views. Impression:            - The examined portion of the ileum was normal.                         Biopsied.                        - One 5 mm polyp in the rectum, removed with a cold                         snare. Resected and retrieved.                        - Normal mucosa in the entire examined colon.                         Biopsied. Clips were placed.                        - The examination was otherwise normal on direct and                         retroflexion views. Recommendation:        - Discharge patient to home (with escort).                        - Resume previous diet.                        - Continue present medications.                        -  Await pathology results.                        - Return to my office as previously scheduled. Procedure Code(s):     --- Professional ---                        [phone], Colonoscopy, flexible; with removal of                         tumor(s), polyp(s), or other lesion(s) by snare                         technique                        [id number], 70, Colonoscopy, flexible; with biopsy, single                         or multiple Diagnosis Code(s):     --- Professional ---                        K62.1, Rectal polyp                         K52.9, Noninfective gastroenteritis and colitis,                         unspecified CPT copyright 2019 American Medical Association. All rights reserved. The codes documented in this report are preliminary and upon coder review may  be revised to meet current compliance requirements. Jonathon Bellows, MD Jonathon Bellows MD, MD 01/16/2022 9:37:50 AM This report has been signed electronically. Number of Addenda: 0 Note Initiated On: 01/16/2022 8:59 AM Scope Withdrawal Time: 0 hours 14 minutes 33 seconds  Total Procedure Duration: 0 hours 17 minutes 11 seconds  Estimated Blood Loss:  Estimated blood loss: none.      [facility]

## 2022-01-16 NOTE — H&P (Signed)
Caitlyn Bellows, MD [address], [place], [place], [place], [address] [address], [place], [place], [place], [address] Phone: [phone]  Fax: [phone]  Primary Care Physician:  Tower, Wynelle Fanny, MD   Pre-Procedure History & Physical: HPI:  Caitlyn Harris is a 41 y.o. female is here for an endoscopy and colonoscopy    Past Medical History:  Diagnosis Date   Anxiety    Depression    Endometriosis    GERD (gastroesophageal reflux disease)    OCC   History of PCOS    Hyperlipidemia    Migraine    Seizures ([place])    FEBRILE AS A BABY    Past Surgical History:  Procedure Laterality Date   ABDOMINAL HYSTERECTOMY     CERVICAL BIOPSY  W/ LOOP ELECTRODE EXCISION     ECTOPIC PREGNANCY SURGERY     endometirosis     HAND SURGERY     THUMB SURGERY   LAPAROSCOPIC OVARIAN CYSTECTOMY Left 03/02/2018   Procedure: LAPAROSCOPIC RIGHT OVARIAN CYSTECTOMY;  Surgeon: Gae Dry, MD;  Location: [facility] ORS;  Service: Gynecology;  Laterality: Left;   LAPAROSCOPIC OVARIAN CYSTECTOMY Left 08/30/2019   Procedure: LAPAROSCOPIC OVARIAN CYSTECTOMY;  Surgeon: Gae Dry, MD;  Location: [facility] ORS;  Service: Gynecology;  Laterality: Left;   LEEP     LEEP     PERINEOPLASTY N/A 03/02/2018   Procedure: PERINEORRHAPHY;  Surgeon: Gae Dry, MD;  Location: [facility] ORS;  Service: Gynecology;  Laterality: N/A;   RECTOCELE REPAIR N/A 03/02/2018   Procedure: POSTERIOR REPAIR (RECTOCELE);  Surgeon: Gae Dry, MD;  Location: [facility] ORS;  Service: Gynecology;  Laterality: N/A;   VENTRAL HERNIA REPAIR N/A 03/19/2017   Procedure: HERNIA REPAIR VENTRAL ADULT;  Surgeon: Florene Glen, MD;  Location: [facility] ORS;  Service: General;  Laterality: N/A;    Prior to Admission medications   Medication Sig Start Date End Date Taking? Authorizing Provider  acetaminophen (TYLENOL) 500 MG tablet Take 1,000 mg by mouth every 6 (six) hours as needed for moderate pain or headache.   Yes [provider]  ibuprofen (ADVIL) 400 MG tablet Take 400 mg by mouth every 6 (six) hours as needed.   Yes [provider]  topiramate (TOPAMAX) 100 MG tablet TAKE 1 TABLET BY MOUTH TWICE A DAY 01/06/22  Yes Tower, Wynelle Fanny, MD  botulinum toxin Type A (BOTOX) 200 units injection Inject 155 units IM into multiple site in the face,neck and head once every 90 days Patient not taking: Reported on 01/13/2022 07/11/21   Pieter Partridge, DO  clonazePAM (KLONOPIN) 0.5 MG tablet Take 0.5-1 tablets (0.25-0.5 mg total) by mouth daily as needed for anxiety. Please limit use Patient not taking: Reported on 01/13/2022 12/20/21   Ursula Alert, MD  hydrOXYzine (VISTARIL) 25 MG capsule TAKE 1-2 CAPSULES (25-50 MG TOTAL) BY MOUTH AT BEDTIME AS NEEDED. FOR SLEEP AND ANXIETY Patient not taking: Reported on 01/13/2022 12/09/21   Ursula Alert, MD  omeprazole (PRILOSEC) 20 MG capsule Take 1 capsule (20 mg total) by mouth 2 (two) times daily before a meal. 01/13/22   Caitlyn Bellows, MD  ondansetron (ZOFRAN-ODT) 8 MG disintegrating tablet Take 1 tablet (8 mg total) by mouth every 8 (eight) hours as needed for nausea or vomiting. Patient not taking: Reported on 01/13/2022 12/27/21   Jaynee Eagles, PA-C  SUMAtriptan (IMITREX) 100 MG tablet MAY REPEAT IN 2 HOURS IF HEADACHE PERSISTS OR RECURS. 06/24/21   Tower, [place] A,  MD  tiZANidine (ZANAFLEX) 4 MG capsule TAKE 1 CAPSULE BY MOUTH 3 TIMES DAILY AS NEEDED FOR MUSCLE SPASMS. Patient not taking: Reported on 01/13/2022 12/19/21   Abner Greenspan, MD    Allergies as of 01/13/2022 - Review Complete 01/13/2022  Allergen Reaction Noted   Tape  08/24/2019    Family History  Problem Relation Age of Onset   Hypertension Mother    Diabetes Mother    Renal cancer Father    Hypertension Father    Post-traumatic stress disorder Brother    Drug abuse Maternal Uncle    Alcohol abuse Maternal Uncle    Bipolar disorder Half-Sister    Bipolar disorder Niece    Suicidality Other      Social History   Socioeconomic History   Marital status: Married    Spouse name: Not on file   Number of children: 2   Years of education: Not on file   Highest education level: Master's degree (e.g., MA, MS, MEng, MEd, MSW, MBA)  Occupational History   Not on file  Tobacco Use   Smoking status: Former    Years: 7.00    Types: Cigarettes    Quit date: 03/12/2009    Years since quitting: 12.8   Smokeless tobacco: Never   Tobacco comments:    1 PACK EVERY 2 WEEKS  Vaping Use   Vaping Use: Never used  Substance and Sexual Activity   Alcohol use: Not Currently    Comment: RARE   Drug use: No   Sexual activity: Yes  Other Topics Concern   Not on file  Social History Narrative   Right handed   Social Determinants of Health   Financial Resource Strain: Not on file  Food Insecurity: Not on file  Transportation Needs: Not on file  Physical Activity: Not on file  Stress: Not on file  Social Connections: Not on file  Intimate Partner Violence: Not on file    Review of Systems: See HPI, otherwise negative ROS  Physical Exam: BP 133/89   Pulse (!) 108   Temp (!) 96.7 F (35.9 C)   Resp 18   Ht '6\' 1"'$  (1.854 m)   Wt 118.4 kg   SpO2 98%   BMI 34.43 kg/m  General:   Alert,  pleasant and cooperative in NAD Head:  Normocephalic and atraumatic. Neck:  Supple; no masses or thyromegaly. Lungs:  Clear throughout to auscultation, normal respiratory effort.    Heart:  +S1, +S2, Regular rate and rhythm, No edema. Abdomen:  Soft, nontender and nondistended. Normal bowel sounds, without guarding, and without rebound.   Neurologic:  Alert and  oriented x4;  grossly normal neurologically.  Impression/Plan: Caitlyn Harris is here for an endoscopy and colonoscopy  to be performed for  evaluation of abdominal pain and diarrhea    Risks, benefits, limitations, and alternatives regarding endoscopy have been reviewed with the patient.  Questions have been answered.  All parties  agreeable.   Caitlyn Bellows, MD  01/16/2022, 8:54 AM

## 2022-01-17 ENCOUNTER — Encounter: Payer: Self-pay | Admitting: Gastroenterology

## 2022-01-17 LAB — SURGICAL PATHOLOGY

## 2022-01-19 LAB — GI PROFILE, STOOL, PCR

## 2022-01-19 LAB — C DIFFICILE, CYTOTOXIN B

## 2022-01-19 LAB — C DIFFICILE TOXINS A+B W/RFLX: C difficile Toxins A+B, EIA: NEGATIVE

## 2022-01-19 LAB — CALPROTECTIN, FECAL: Calprotectin, Fecal: 12 ug/g (ref 0–120)

## 2022-01-21 ENCOUNTER — Encounter: Payer: Self-pay | Admitting: Psychiatry

## 2022-01-21 ENCOUNTER — Ambulatory Visit (INDEPENDENT_AMBULATORY_CARE_PROVIDER_SITE_OTHER): Payer: BC Managed Care – PPO | Admitting: Psychiatry

## 2022-01-21 ENCOUNTER — Telehealth (HOSPITAL_COMMUNITY): Payer: Self-pay | Admitting: Psychiatry

## 2022-01-21 VITALS — BP 128/85 | HR 85 | Temp 98.0°F | Ht 73.0 in | Wt 267.0 lb

## 2022-01-21 DIAGNOSIS — R41840 Attention and concentration deficit: Secondary | ICD-10-CM

## 2022-01-21 DIAGNOSIS — F332 Major depressive disorder, recurrent severe without psychotic features: Secondary | ICD-10-CM

## 2022-01-21 DIAGNOSIS — Z634 Disappearance and death of family member: Secondary | ICD-10-CM | POA: Diagnosis not present

## 2022-01-21 DIAGNOSIS — F411 Generalized anxiety disorder: Secondary | ICD-10-CM

## 2022-01-21 MED ORDER — SERTRALINE HCL 25 MG PO TABS: 25.0000 mg | ORAL_TABLET | Freq: Every day | ORAL | 1 refills | Status: DC

## 2022-01-21 NOTE — Telephone Encounter (Signed)
D:  Dr. Shea Evans referred pt to [facility].  A:  Placed call to orient pt and answer all her questions.  Encouraged pt to verify her insurance benefits.  Patient states she will contact her HR dept re: LOA and paperwork.  [facility] scheduled for 01-28-22 @ 9:30 a.m.  Inform Dr. Shea Evans.  R:  Pt receptive.

## 2022-01-21 NOTE — Patient Instructions (Signed)
[email or web address]  [email or web address]  [email or web address] [address], [place], [place] [address]  ~20.2 mi [phone]  [facility], [email or web address] [address] [place], [place] [address]  ~20.9 mi [phone]   [facility] - [phone]  [facility] - [phone]  [facility] - [place] [phone]  [facility] [phone]     Serotonin Syndrome Serotonin is a chemical that helps to control several functions in the body. This chemical is also called a neurotransmitter. It controls: Brain and nerve cell function. Mood and emotions. Memory. Eating. Sleeping. Sexual activity. Stress response. Having too much serotonin in your body can cause serotonin syndrome. This condition can be harmful to your brain and nerve cells. This can be a life-threatening condition. What are the causes? This condition may be caused by taking medicines or drugs that increase the level of serotonin in your body, such as: Antidepressant medicines. Migraine medicines. Certain pain medicines. Certain drugs, including ecstasy, LSD, cocaine, and amphetamines. Over-the-counter cough or cold medicines that contain dextromethorphan. Certain herbal supplements, including St. John's wort, ginseng, and nutmeg. This condition usually occurs when you take these medicines or drugs together, but it can also happen with a high dose of a single medicine or drug. What increases the risk? You are more likely to develop this condition if: You just started taking a medicine or drug that increases the level of serotonin in the body. You recently increased the dose of a medicine or drug that increases the level of serotonin in the body. You take more than one medicine or drug that increases the level of serotonin in the body. What are the signs or  symptoms? Symptoms of this condition usually start within several hours of taking a medicine or drug. Symptoms may be mild or severe. Mild symptoms include: Sweating. Restlessness or agitation. Muscle twitching or stiffness. Rapid heart rate. Nausea, vomiting, or diarrhea. Shivering or goose bumps. Confusion. Severe symptoms include: Irregular heartbeat. Seizures. Loss of consciousness. High fever. How is this diagnosed? This condition may be diagnosed based on: Your medical history. A physical exam. Your prior use of drugs and medicines. Blood or urine tests. These may be used to rule out other causes of your symptoms. How is this treated? The treatment for this condition depends on the severity of your symptoms. For mild cases, stopping the medicine or drug that caused your condition is usually all that is needed. For moderate to severe cases, treatment in a hospital may be needed to prevent or treat life-threatening symptoms. Treatment may include: Medicines to control your symptoms. IV fluids. Actions to support your breathing. Treatments to control your body temperature. Follow these instructions at home: Medicines  Take over-the-counter and prescription medicines only as told by your health care provider. Check with your health care provider before you start taking any new prescriptions, over-the-counter medicines, herbs, or supplements. Do not combine any medicines that can cause this condition. Lifestyle  Maintain a healthy lifestyle. Eat a healthy diet that includes plenty of vegetables, fruits, whole grains, low-fat dairy products, and lean protein. Do not eat a lot of foods that are high in fat, added sugars, or salt. Get the right amount and quality of sleep. Most adults need 7-9 hours of sleep each night. Make time to exercise, even if it is only for short periods of time. Most adults should exercise for at least 150  minutes each week. Do not drink alcohol. Do  not use illegal drugs. Do not take medicines for reasons other than they are prescribed. General instructions Do not use any products that contain nicotine or tobacco. These products include cigarettes, chewing tobacco, and vaping devices, such as e-cigarettes. If you need help quitting, ask your health care provider. Contact a health care provider if: Your symptoms do not improve or they get worse. Get help right away if: You have worsening confusion, severe headache, chest pain, high fever, seizures, or loss of consciousness. You experience serious side effects of medicine, such as swelling of your face, lips, tongue, or throat. These symptoms may be an emergency. Get help right away. Call 911. Do not wait to see if the symptoms will go away. Do not drive yourself to the hospital. Also, get help right away if: You have serious thoughts about hurting yourself or others. Take one of these steps if you feel like you may hurt yourself or others, or have thoughts about taking your own life: Go to your nearest emergency room. Call 911. Call the [facility] at [phone] or [phone]. This is open 24 hours a day. Text the Crisis Text Line at (330) 698-3496. Summary Serotonin is a chemical that helps to control several functions in the body. High levels of serotonin in the body can cause serotonin syndrome, which can be life-threatening. This condition may be caused by taking medicines or drugs that increase the level of serotonin in your body. Treatment depends on the severity of your symptoms. For mild cases, stopping the medicine or drug that caused your condition is usually all that is needed. Check with your health care provider before you start taking any new prescriptions, over-the-counter medicines, herbs, or supplements. This information is not intended to replace advice given to you by your health care provider. Make sure you discuss any questions you have with your health  care provider. Document Revised: 06/13/2021 Document Reviewed: 06/13/2021 Elsevier Patient Education  [place]. Sertraline Tablets What is this medication? SERTRALINE (SER tra leen) treats depression, anxiety, obsessive-compulsive disorder (OCD), post-traumatic stress disorder (PTSD), and premenstrual dysphoric disorder (PMDD). It increases the amount of serotonin in the brain, a hormone that helps regulate mood. It belongs to a group of medications called SSRIs. This medicine may be used for other purposes; ask your health care provider or pharmacist if you have questions. COMMON BRAND NAME(S): Zoloft What should I tell my care team before I take this medication? They need to know if you have any of these conditions: Bleeding disorders Bipolar disorder or a family history of bipolar disorder Frequently drink alcohol Glaucoma Heart disease High blood pressure History of irregular heartbeat History of low levels of calcium, magnesium, or potassium in the blood Liver disease Receiving electroconvulsive therapy Seizures Suicidal thoughts, plans, or attempt; a previous suicide attempt by you or a family member Take medications that prevent or treat blood clots Thyroid disease An unusual or allergic reaction to sertraline, other medications, foods, dyes, or preservatives Pregnant or trying to get pregnant Breast-feeding How should I use this medication? Take this medication by mouth with a glass of water. Follow the directions on the prescription label. You can take it with or without food. Take your medication at regular intervals. Do not take your medication more often than directed. Do not stop taking this medication suddenly except upon the advice of your care team. Stopping this medication too quickly may cause serious side effects or your  condition may worsen. A special MedGuide will be given to you by the pharmacist with each prescription and refill. Be sure to read this  information carefully each time. Talk to your care team about the use of this medication in children. While this medication may be prescribed for children as young as 7 years for selected conditions, precautions do apply. Overdosage: If you think you have taken too much of this medicine contact a poison control center or emergency room at once. NOTE: This medicine is only for you. Do not share this medicine with others. What if I miss a dose? If you miss a dose, take it as soon as you can. If it is almost time for your next dose, take only that dose. Do not take double or extra doses. What may interact with this medication? Do not take this medication with any of the following: Cisapride Dronedarone Linezolid MAOIs like Carbex, Eldepryl, Marplan, Nardil, and Parnate Methylene blue (injected into a vein) Pimozide Thioridazine This medication may also interact with the following: Alcohol Amphetamines Aspirin and aspirin-like medications Certain medications for depression, anxiety, or other mental health conditions Certain medications for fungal infections like ketoconazole, fluconazole, posaconazole, and itraconazole Certain medications for irregular heart beat like flecainide, quinidine, propafenone Certain medications for migraine headaches like almotriptan, eletriptan, frovatriptan, naratriptan, rizatriptan, sumatriptan, zolmitriptan Certain medications for sleep Certain medications for seizures like carbamazepine, valproic acid, phenytoin Certain medications that treat or prevent blood clots like warfarin, enoxaparin, dalteparin Cimetidine Digoxin Diuretics Fentanyl Isoniazid Lithium NSAIDs, medications for pain and inflammation, like ibuprofen or naproxen Other medications that prolong the QT interval (cause an abnormal heart rhythm) like dofetilide Rasagiline Safinamide Supplements like St. John's wort, kava kava, valerian Tolbutamide Tramadol Tryptophan This list may not  describe all possible interactions. Give your health care provider a list of all the medicines, herbs, non-prescription drugs, or dietary supplements you use. Also tell them if you smoke, drink alcohol, or use illegal drugs. Some items may interact with your medicine. What should I watch for while using this medication? Tell your care team if your symptoms do not get better or if they get worse. Visit your care team for regular checks on your progress. Because it may take several weeks to see the full effects of this medication, it is important to continue your treatment as prescribed by your care team. Patients and their families should watch out for new or worsening thoughts of suicide or depression. Also watch out for sudden changes in feelings such as feeling anxious, agitated, panicky, irritable, hostile, aggressive, impulsive, severely restless, overly excited and hyperactive, or not being able to sleep. If this happens, especially at the beginning of treatment or after a change in dose, call your care team. This medication may affect your coordination, reaction time, or judgment. Do not drive or operate machinery until you know how this medication affects you. Sit or stand up slowly to reduce the risk of dizzy or fainting spells. Drinking alcohol with this medication can increase the risk of these side effects. Your mouth may get dry. Chewing sugarless gum or sucking hard candy, and drinking plenty of water may help. Contact your care team if the problem does not go away or is severe. What side effects may I notice from receiving this medication? Side effects that you should report to your care team as soon as possible: Allergic reactions--skin rash, itching, hives, swelling of the face, lips, tongue, or throat Bleeding--bloody or black, tar-like stools, red or dark brown  urine, vomiting blood or brown material that looks like coffee grounds, small red or purple spots on skin, unusual bleeding or  bruising Heart rhythm changes--fast or irregular heartbeat, dizziness, feeling faint or lightheaded, chest pain, trouble breathing Low sodium level--muscle weakness, fatigue, dizziness, headache, confusion Serotonin syndrome--irritability, confusion, fast or irregular heartbeat, muscle stiffness, twitching muscles, sweating, high fever, seizure, chills, vomiting, diarrhea Sudden eye pain or change in vision such as blurred vision, seeing halos around lights, vision loss Thoughts of suicide or self-harm, worsening mood Side effects that usually do not require medical attention (report these to your care team if they continue or are bothersome): Change in sex drive or performance Diarrhea Excessive sweating Nausea Tremors or shaking Upset stomach This list may not describe all possible side effects. Call your doctor for medical advice about side effects. You may report side effects to FDA at 1-800-FDA-1088. Where should I keep my medication? Keep out of the reach of children and pets. Store at room temperature between 15 and 30 degrees C (59 and 86 degrees F). Get rid of any unused medication after the expiration date. To get rid of medications that are no longer needed or expired: Take the medication to a medication take-back program. Check with your pharmacy or law enforcement to find a location. If you cannot return the medication, check the label or package insert to see if the medication should be thrown out in the garbage or flushed down the toilet. If you are not sure, ask your care team. If it is safe to put in the trash, empty the medication out of the container. Mix the medication with cat litter, dirt, coffee grounds, or other unwanted substance. Seal the mixture in a bag or container. Put it in the trash. NOTE: This sheet is a summary. It may not cover all possible information. If you have questions about this medicine, talk to your doctor, pharmacist, or health care provider.  2023  Elsevier/Gold Standard (2007-05-15 00:00:00)

## 2022-01-21 NOTE — Progress Notes (Signed)
[facility]   Procedure Note Botox  Attending: Dr. Metta Clines  Preoperative Diagnosis(es): Chronic migraine  Consent obtained from: The patient Benefits discussed included, but were not limited to decreased muscle tightness, increased joint range of motion, and decreased pain.  Risk discussed included, but were not limited pain and discomfort, bleeding, bruising, excessive weakness, venous thrombosis, muscle atrophy and dysphagia.  Anticipated outcomes of the procedure as well as he risks and benefits of the alternatives to the procedure, and the roles and tasks of the personnel to be involved, were discussed with the patient, and the patient consents to the procedure and agrees to proceed. A copy of the patient medication guide was given to the patient which explains the blackbox warning.  Patients identity and treatment sites confirmed Yes.  .  Details of Procedure: Skin was cleaned with alcohol. Prior to injection, the needle plunger was aspirated to make sure the needle was not within a blood vessel.  There was no blood retrieved on aspiration.    Following is a summary of the muscles injected  And the amount of Botulinum toxin used:  Dilution 200 units of Botox was reconstituted with 4 ml of preservative free normal saline. Time of reconstitution: At the time of the office visit (<30 minutes prior to injection)   Injections  155 total units of Botox was injected with a 30 gauge needle.  Injection Sites: L occipitalis: 15 units- 3 sites  R occiptalis: 15 units- 3 sites  L upper trapezius: 15 units- 3 sites R upper trapezius: 15 units- 3 sits          L paraspinal: 10 units- 2 sites R paraspinal: 10 units- 2 sites  Face L frontalis(2 injection sites):10 units   R frontalis(2 injection sites):10 units         L corrugator: 5 units   R corrugator: 5 units           Procerus: 5 units   L temporalis: 20 units R temporalis: 20 units   Agent:  200 units of botulinum Type  A (Onobotulinum Toxin type A) was reconstituted with 4 ml of preservative free normal saline.  Time of reconstitution: At the time of the office visit (<30 minutes prior to injection)     Total injected (Units):  155  Total wasted (Units):  45  Patient tolerated procedure well without complications.   Reinjection is anticipated in 3 months. Return to clinic in 5 months

## 2022-01-21 NOTE — Progress Notes (Unsigned)
[place] MD OP Progress Note  01/21/2022 6:28 PM Caitlyn Harris  MRN:  [id number]  Chief Complaint:  Chief Complaint  Patient presents with   Follow-up   Anxiety   Depression   Medication Problem   HPI: Caitlyn Harris is a 41 year old Caucasian female, married, employed, lives in [place], has a history of MDD, GAD, IBS, migraine headaches, hysterectomy, presented for medication management.  Patient today reports she is currently struggling with multiple psychosocial stressors.  Patient reports she continues to have relationship struggles with her mother.  Patient also reports medical problems-recent GI bleed, currently under the care of gastroenterologist.  Had colonoscopy, reports found polyps which were biopsied.  Currently does not have any bleeding however will have to follow up with her providers.  This has been extremely stressful for her.  Patient currently reports sadness, anxiety, worrying about things, concentration problem, sleep problems and so on.  She did not tolerate the venlafaxine.  Is interested in trial of another medication.  Has not tried sertraline before, agreeable to trial.  Does have clonazepam available as needed however is trying to limit use and may have used it only once or twice since prescribed.  Denies any suicidality, homicidality or perceptual disturbances.  Has not been able to establish care with a therapist yet however is motivated to start therapy.  Denies any other concerns today.  Visit Diagnosis:    ICD-10-CM   1. MDD (major depressive disorder), recurrent severe, without psychosis ([place])  F33.2 sertraline (ZOLOFT) 25 MG tablet    2. GAD (generalized anxiety disorder)  F41.1 sertraline (ZOLOFT) 25 MG tablet    3. Bereavement  Z63.4     4. Attention and concentration deficit  R41.840       Past Psychiatric History: Reviewed past psychiatric history from progress note on 11/15/2021.  Past trials of medications like Celexa, Ambien, ADHD  medications like stimulants-does not remember names.  Past Medical History:  Past Medical History:  Diagnosis Date   Anxiety    Depression    Endometriosis    GERD (gastroesophageal reflux disease)    OCC   History of PCOS    Hyperlipidemia    Migraine    Seizures ([place])    FEBRILE AS A BABY    Past Surgical History:  Procedure Laterality Date   ABDOMINAL HYSTERECTOMY     CERVICAL BIOPSY  W/ LOOP ELECTRODE EXCISION     COLONOSCOPY WITH PROPOFOL N/A 01/16/2022   Procedure: COLONOSCOPY WITH PROPOFOL;  Surgeon: Jonathon Bellows, MD;  Location: [facility];  Service: Gastroenterology;  Laterality: N/A;   ECTOPIC PREGNANCY SURGERY     endometirosis     ESOPHAGOGASTRODUODENOSCOPY (EGD) WITH PROPOFOL N/A 01/16/2022   Procedure: ESOPHAGOGASTRODUODENOSCOPY (EGD) WITH PROPOFOL;  Surgeon: Jonathon Bellows, MD;  Location: [facility] ENDOSCOPY;  Service: Gastroenterology;  Laterality: N/A;   HAND SURGERY     THUMB SURGERY   LAPAROSCOPIC OVARIAN CYSTECTOMY Left 03/02/2018   Procedure: LAPAROSCOPIC RIGHT OVARIAN CYSTECTOMY;  Surgeon: Gae Dry, MD;  Location: [facility] ORS;  Service: Gynecology;  Laterality: Left;   LAPAROSCOPIC OVARIAN CYSTECTOMY Left 08/30/2019   Procedure: LAPAROSCOPIC OVARIAN CYSTECTOMY;  Surgeon: Gae Dry, MD;  Location: [facility] ORS;  Service: Gynecology;  Laterality: Left;   LEEP     LEEP     PERINEOPLASTY N/A 03/02/2018   Procedure: PERINEORRHAPHY;  Surgeon: Gae Dry, MD;  Location: [facility] ORS;  Service: Gynecology;  Laterality: N/A;   RECTOCELE REPAIR N/A 03/02/2018   Procedure: POSTERIOR REPAIR (RECTOCELE);  Surgeon: Gae Dry, MD;  Location: [facility] ORS;  Service: Gynecology;  Laterality: N/A;   VENTRAL HERNIA REPAIR N/A 03/19/2017   Procedure: HERNIA REPAIR VENTRAL ADULT;  Surgeon: Florene Glen, MD;  Location: [facility] ORS;  Service: General;  Laterality: N/A;    Family Psychiatric History: Reviewed family psychiatric history from progress note on  11/15/2021.  Family History:  Family History  Problem Relation Age of Onset   Hypertension Mother    Diabetes Mother    Renal cancer Father    Hypertension Father    Post-traumatic stress disorder Brother    Drug abuse Maternal Uncle    Alcohol abuse Maternal Uncle    Bipolar disorder Half-Sister    Bipolar disorder Niece    Suicidality Other     Social History: Reviewed social history from progress note on 11/15/2021. Social History   Socioeconomic History   Marital status: Married    Spouse name: Not on file   Number of children: 2   Years of education: Not on file   Highest education level: Master's degree (e.g., MA, MS, MEng, MEd, MSW, MBA)  Occupational History   Not on file  Tobacco Use   Smoking status: Former    Years: 7.00    Types: Cigarettes    Quit date: 03/12/2009    Years since quitting: 12.8   Smokeless tobacco: Never   Tobacco comments:    1 PACK EVERY 2 WEEKS  Vaping Use   Vaping Use: Never used  Substance and Sexual Activity   Alcohol use: Not Currently    Comment: RARE   Drug use: No   Sexual activity: Yes  Other Topics Concern   Not on file  Social History Narrative   Right handed   Social Determinants of Health   Financial Resource Strain: Not on file  Food Insecurity: Not on file  Transportation Needs: Not on file  Physical Activity: Not on file  Stress: Not on file  Social Connections: Not on file    Allergies:  Allergies  Allergen Reactions   Tape     Low sensitivity to tape, ok with Tegaderm and paper tape     Metabolic Disorder Labs: Lab Results  Component Value Date   HGBA1C 4.7 08/17/2017   Lab Results  Component Value Date   PROLACTIN 13.3 12/21/2017   Lab Results  Component Value Date   CHOL 142 06/07/2018   TRIG 193.0 (H) 06/07/2018   HDL 28.00 (L) 06/07/2018   CHOLHDL 5 06/07/2018   VLDL 38.6 06/07/2018   LDLCALC 75 06/07/2018   Lab Results  Component Value Date   TSH 1.57 06/26/2021   TSH 0.89  08/17/2017    Therapeutic Level Labs: No results found for: "LITHIUM" No results found for: "VALPROATE" No results found for: "CBMZ"  Current Medications: Current Outpatient Medications  Medication Sig Dispense Refill   acetaminophen (TYLENOL) 500 MG tablet Take 1,000 mg by mouth every 6 (six) hours as needed for moderate pain or headache.     botulinum toxin Type A (BOTOX) 200 units injection Inject 155 units IM into multiple site in the face,neck and head once every 90 days 1 each 4   ibuprofen (ADVIL) 400 MG tablet Take 400 mg by mouth every 6 (six) hours as needed.     omeprazole (PRILOSEC) 20 MG capsule Take 1 capsule (20 mg total) by mouth 2 (two) times daily before a meal. 90 capsule 2   ondansetron (ZOFRAN-ODT) 8 MG disintegrating tablet Take 1  tablet (8 mg total) by mouth every 8 (eight) hours as needed for nausea or vomiting. 20 tablet 0   sertraline (ZOLOFT) 25 MG tablet Take 1 tablet (25 mg total) by mouth daily with breakfast. 30 tablet 1   SUMAtriptan (IMITREX) 100 MG tablet MAY REPEAT IN 2 HOURS IF HEADACHE PERSISTS OR RECURS. 30 tablet 3   tiZANidine (ZANAFLEX) 4 MG capsule TAKE 1 CAPSULE BY MOUTH 3 TIMES DAILY AS NEEDED FOR MUSCLE SPASMS. 30 capsule 3   topiramate (TOPAMAX) 100 MG tablet TAKE 1 TABLET BY MOUTH TWICE A DAY 180 tablet 3   clonazePAM (KLONOPIN) 0.5 MG tablet Take 0.5-1 tablets (0.25-0.5 mg total) by mouth daily as needed for anxiety. Please limit use (Patient not taking: Reported on 01/21/2022) 10 tablet 0   hydrOXYzine (VISTARIL) 25 MG capsule TAKE 1-2 CAPSULES (25-50 MG TOTAL) BY MOUTH AT BEDTIME AS NEEDED. FOR SLEEP AND ANXIETY (Patient not taking: Reported on 01/21/2022) 180 capsule 0   No current facility-administered medications for this visit.     Musculoskeletal: Strength & Muscle Tone: within normal limits Gait & Station: normal Patient leans: N/A  Psychiatric Specialty Exam: Review of Systems  Psychiatric/Behavioral:  Positive for decreased  concentration, dysphoric mood and sleep disturbance. The patient is nervous/anxious.   All other systems reviewed and are negative.   Blood pressure 128/85, pulse 85, temperature 98 F (36.7 C), temperature source Oral, height '6\' 1"'$  (1.854 m), weight 267 lb (121.1 kg).Body mass index is 35.23 kg/m.  General Appearance: Casual  Eye Contact:  Fair  Speech:  Clear and Coherent  Volume:  Normal  Mood:  Anxious and Depressed  Affect:  Congruent  Thought Process:  Goal Directed and Descriptions of Associations: Intact  Orientation:  Full (Time, Place, and Person)  Thought Content: Logical   Suicidal Thoughts:  No  Homicidal Thoughts:  No  Memory:  Immediate;   Fair Recent;   Fair Remote;   Fair  Judgement:  Fair  Insight:  Fair  Psychomotor Activity:  Normal  Concentration:  Concentration: Fair and Attention Span: Fair  Recall:  [employer] of Knowledge: Fair  Language: Fair  Akathisia:  No  Handed:  Right  AIMS (if indicated): not done  Assets:  Communication Skills Desire for [place] Talents/Skills Transportation  ADL's:  Intact  Cognition: WNL  Sleep:  Poor   Screenings: GAD-7    Flowsheet Row Office Visit from 01/21/2022 in [place] Office Visit from 12/11/2021 in [place] Office Visit from 11/15/2021 in [facility] [place] Office Visit from 08/11/2018 in [place] at [facility] Visit from 06/11/2018 in [place] at [facility]  Total GAD-7 Score '14 7 11 3 8      '$ PHQ2-9    [place] Office Visit from 01/21/2022 in [place] Office Visit from 12/11/2021 in [facility] [place] Visit from 11/15/2021 in [place] Video Visit from 02/06/2021 in [place] at [facility] Visit from 08/11/2018 in [facility] [facility] at [place]   PHQ-2 Total Score '5 2 6 '$ 0 3  PHQ-9 Total Score '21 12 20 9 13      '$ [place] Office Visit from 01/21/2022 in [place] Admission (Discharged) from 01/16/2022 in [place] ED from 12/27/2021 in [facility] [place] No Risk No Risk  Assessment and Plan: Caitlyn Harris is a 41 year old Caucasian female, currently employed, married, lives in [place], has a history of depression, anxiety, migraine headaches, IBS, recent GI bleed currently resolved, was evaluated in office today.  Patient continues to struggle with mood symptoms, concentration problems, will benefit from the following plan.  Plan  MDD-unstable Start sertraline 25 mg p.o. daily in the morning.  Provided medication education, discussed side effects, including increased risk of GI bleed.  Patient would like to try this medication. Referred for CBT, provided community resources.  GAD-unstable Add sertraline 25 mg p.o. daily as noted above. Hydroxyzine 25-50 mg at bedtime as needed, patient advised she could go up to 75 mg and 50 mg does not work.  Will consider adding a sleep medication at her next visit if this does not help. Klonopin 0.25 to 0.5 mg as needed.  Currently limiting use Referred for CBT.  Bereavement-improving Referred for CBT  Attention and concentration deficit-unstable Patient has upcoming neuropsychological testing  Follow-up in clinic in 3 to 4 weeks or sooner if needed.  This note was generated in part or whole with voice recognition software. Voice recognition is usually quite accurate but there are transcription errors that can and very often do occur. I apologize for any typographical errors that were not detected and corrected.     Ursula Alert, MD 01/22/2022, 8:32 AM

## 2022-01-22 ENCOUNTER — Encounter: Payer: Self-pay | Admitting: Neurology

## 2022-01-22 ENCOUNTER — Ambulatory Visit: Payer: BC Managed Care – PPO | Admitting: Psychology

## 2022-01-22 DIAGNOSIS — F89 Unspecified disorder of psychological development: Secondary | ICD-10-CM
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
## 2022-01-23 ENCOUNTER — Ambulatory Visit: Payer: BC Managed Care – PPO | Admitting: Neurology

## 2022-01-23 DIAGNOSIS — G43709 Chronic migraine without aura, not intractable, without status migrainosus: Secondary | ICD-10-CM | POA: Diagnosis not present

## 2022-01-23 MED ORDER — ONABOTULINUMTOXINA 200 UNITS IJ SOLR
200.0000 [IU] | Freq: Once | INTRAMUSCULAR | Status: AC
  Administered 2022-01-23: 155 [IU] via INTRAMUSCULAR

## 2022-01-28 ENCOUNTER — Telehealth (HOSPITAL_COMMUNITY): Payer: Self-pay | Admitting: Psychiatry

## 2022-01-28 ENCOUNTER — Other Ambulatory Visit (HOSPITAL_COMMUNITY): Payer: BC Managed Care – PPO | Attending: Psychiatry | Admitting: Psychiatry
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
## 2022-01-29 ENCOUNTER — Ambulatory Visit (HOSPITAL_COMMUNITY): Payer: BC Managed Care – PPO

## 2022-01-29 ENCOUNTER — Ambulatory Visit: Payer: BC Managed Care – PPO | Admitting: Psychology

## 2022-01-29 DIAGNOSIS — F89 Unspecified disorder of psychological development: Secondary | ICD-10-CM

## 2022-01-29 NOTE — Progress Notes (Signed)
Date: 01/29/2022   Appointment Start Time: 4:05pm (patient arrived late to the appointment) Duration: 90 minutes Provider: Clarice Pole, PsyD Type of Session: Testing Appointment for Evaluation  Location of Patient: Home Location of Provider: Provider's Home (private office) Type of Contact: WebEx video visit with audio  Session Content: Today's appointment was a telepsychological visit due to COVID-19. Caitlyn Harris is aware it is her responsibility to secure confidentiality on her end of the session. Prior to proceeding with today's appointment, Caitlyn Harris's physical location at the time of this appointment was obtained as well a phone number she could be reached at in the event of technical difficulties. Caitlyn Harris denied anyone else being present in the room or on the virtual appointment. This provider reviewed that video should not be captured, photos should not be taken, nor should testing stimuli be copied or recorded as it would be a copyright violation. Caitlyn Harris expressed understanding of the aforementioned, and verbally consented to proceed. The WAIS-IV was administered, scored, and interpreted by this evaluator  Billing codes will be input on the feedback appointment. There are no billing codes for the testing appointment.   Provisional DSM-5 diagnosis(es):  F89 Unspecified Disorder of Psychological Development   Plan: Caitlyn Harris was scheduled for a feedback appointment on 02/06/2022 at 4pm via WebEx video visit with audio.                Dolores Lory, PsyD

## 2022-01-30 ENCOUNTER — Ambulatory Visit (HOSPITAL_COMMUNITY): Payer: BC Managed Care – PPO

## 2022-01-31 ENCOUNTER — Ambulatory Visit (HOSPITAL_COMMUNITY): Payer: BC Managed Care – PPO

## 2022-01-31 ENCOUNTER — Other Ambulatory Visit (HOSPITAL_COMMUNITY): Payer: Self-pay

## 2022-02-03 ENCOUNTER — Encounter (HOSPITAL_COMMUNITY): Payer: Self-pay | Admitting: Psychiatry

## 2022-02-03 ENCOUNTER — Other Ambulatory Visit (HOSPITAL_COMMUNITY): Payer: BC Managed Care – PPO | Attending: Psychiatry | Admitting: Licensed Clinical Social Worker

## 2022-02-03 DIAGNOSIS — F909 Attention-deficit hyperactivity disorder, unspecified type: Secondary | ICD-10-CM | POA: Insufficient documentation

## 2022-02-03 DIAGNOSIS — F419 Anxiety disorder, unspecified: Secondary | ICD-10-CM | POA: Diagnosis not present

## 2022-02-03 DIAGNOSIS — F32A Depression, unspecified: Secondary | ICD-10-CM | POA: Insufficient documentation

## 2022-02-03 DIAGNOSIS — F332 Major depressive disorder, recurrent severe without psychotic features: Secondary | ICD-10-CM

## 2022-02-03 NOTE — Plan of Care (Signed)
Pt is an active participant in all the groups.
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
## 2022-02-04 ENCOUNTER — Other Ambulatory Visit (HOSPITAL_COMMUNITY): Payer: BC Managed Care – PPO | Attending: Psychiatry | Admitting: Psychiatry

## 2022-02-04 DIAGNOSIS — Z79899 Other long term (current) drug therapy: Secondary | ICD-10-CM | POA: Diagnosis not present

## 2022-02-04 DIAGNOSIS — F411 Generalized anxiety disorder: Secondary | ICD-10-CM | POA: Diagnosis not present

## 2022-02-04 DIAGNOSIS — Z87891 Personal history of nicotine dependence: Secondary | ICD-10-CM | POA: Insufficient documentation

## 2022-02-04 DIAGNOSIS — G479 Sleep disorder, unspecified: Secondary | ICD-10-CM | POA: Insufficient documentation

## 2022-02-04 DIAGNOSIS — F332 Major depressive disorder, recurrent severe without psychotic features: Secondary | ICD-10-CM | POA: Diagnosis not present

## 2022-02-04 NOTE — Addendum Note (Signed)
Addended byArmando Reichert on: 02/04/2022 09:42 PM   Modules accepted: Level of Service
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
## 2022-02-04 NOTE — Progress Notes (Signed)
Virtual Visit via Video Note   I connected with Caitlyn Harris on 02/04/22 at  9:00 AM EDT by a video enabled telemedicine application and verified that I am speaking with the correct person using two identifiers.   At orientation to the IOP program, Case Manager discussed the limitations of evaluation and management by telemedicine and the availability of in person appointments. The patient expressed understanding and agreed to proceed with virtual visits throughout the duration of the program.   Location:  Patient: Patient Home Provider: [facility] [employer] Office   History of Present Illness: MDD   Observations/Objective: Check In: Case Manager checked in with all participants to review discharge dates, insurance authorizations, work-related documents and needs from the treatment team regarding medications. Caitlyn Harris stated needs and engaged in discussion.    Initial Therapeutic Activity: Counselor facilitated a check-in with Caitlyn Harris to assess for safety, sobriety and medication compliance.  Counselor also inquired about Caitlyn Harris's current emotional ratings, as well as any significant changes in thoughts, feelings or behavior since previous check in.  Caitlyn Harris presented for session on time and was alert, oriented x5, with no evidence or self-report of active SI/HI or A/V H.  Caitlyn Harris reported compliance with medication and denied use of alcohol or illicit substances.  Caitlyn Harris reported scores of 8/10 for depression, 6/10 for anxiety, and 4/10 for anger/irritability.  Caitlyn Harris denied any recent panic attacks.  Caitlyn Harris reported that a recent success was starting her new medication in order to help with recent mood instability.  Caitlyn Harris reported that a recent struggle was having outbursts yesterday evening, stating "Little things are causing me to snap".  Caitlyn Harris reported that her goal today is to go trick or treating with family this evening.        Second Therapeutic Activity: Counselor introduced  [employer], [place] Chaplain to provide psychoeducation on topic of Grief and Loss with members today.  Caitlyn Harris began discussion by checking in with the group about their baseline mood today, general thoughts on what grief means to them and how it has affected them personally in the past.  Caitlyn Harris provided information on how the process of grief/loss can differ depending upon one's unique culture, and categories of loss one could experience (i.e. loss of a person, animal, relationship, job, identity, etc).  Caitlyn Harris encouraged members to be mindful of how pervasive loss can be, and how to recognize signs which could indicate that this is having an impact on one's overall mental health and wellbeing.  Intervention was effective, as evidenced by Caitlyn Malta participating in discussion with speaker on the subject, reporting that grief is something she has been struggling with for some time now, although she is motivated to continue both group and individual therapy in order to help process associated feelings in a healthy manner and avoid excessive compartmentalization.  Caitlyn Harris reported that she is also trying to come to terms with loss of identity, as she had to move to a new area, her mother is more dependent upon her help since they are sharing her home, and these changes have been difficult to adjust to.     Assessment and Plan: Counselor recommends that Caitlyn Harris remain in IOP treatment to better manage mental health symptoms, ensure stability and pursue completion of treatment plan goals. Counselor recommends adherence to crisis/safety plan, taking medications as prescribed, and following up with medical professionals if any issues arise.   Follow Up Instructions: Counselor will send Webex link for next session. Caitlyn Harris was advised to call back or  seek an in-person evaluation if the symptoms worsen or if the condition fails to improve as anticipated.   Collaboration of Care:   Medication Management AEB  Dr. Armando Reichert or Ricky Ala, NP                                          Case Manager AEB Dellia Nims, CNA    Patient/Guardian was advised Release of Information must be obtained prior to any record release in order to collaborate their care with an outside provider. Patient/Guardian was advised if they have not already done so to contact the registration department to sign all necessary forms in order for [place] to release information regarding their care.   Consent: Patient/Guardian gives verbal consent for treatment and assignment of benefits for services provided during this visit. Patient/Guardian expressed understanding and agreed to proceed.  I provided 180 minutes of non-face-to-face time during this encounter.   Shade Flood, [place], LCAS 02/04/22

## 2022-02-05 ENCOUNTER — Other Ambulatory Visit (HOSPITAL_COMMUNITY): Payer: BC Managed Care – PPO | Attending: Licensed Clinical Social Worker | Admitting: Licensed Clinical Social Worker

## 2022-02-05 DIAGNOSIS — F419 Anxiety disorder, unspecified: Secondary | ICD-10-CM | POA: Diagnosis not present

## 2022-02-05 DIAGNOSIS — Z566 Other physical and mental strain related to work: Secondary | ICD-10-CM | POA: Diagnosis not present

## 2022-02-05 DIAGNOSIS — F332 Major depressive disorder, recurrent severe without psychotic features: Secondary | ICD-10-CM | POA: Insufficient documentation

## 2022-02-05 NOTE — Progress Notes (Signed)
Virtual Visit via Video Note   I connected with [name] A. Milo on 02/05/22 at  9:00 AM EDT by a video enabled telemedicine application and verified that I am speaking with the correct person using two identifiers.   At orientation to the IOP program, Case Manager discussed the limitations of evaluation and management by telemedicine and the availability of in person appointments. The patient expressed understanding and agreed to proceed with virtual visits throughout the duration of the program.   Location:  Patient: Patient Home Provider: OPT [place] Office   History of Present Illness: MDD   Observations/Objective: Check In: Case Manager checked in with all participants to review discharge dates, insurance authorizations, work-related documents and needs from the treatment team regarding medications. Saniah stated needs and engaged in discussion.    Initial Therapeutic Activity: Counselor facilitated a check-in with Jazmarie to assess for safety, sobriety and medication compliance.  Counselor also inquired about Lashay's current emotional ratings, as well as any significant changes in thoughts, feelings or behavior since previous check in.  Havana presented for session on time and was alert, oriented x5, with no evidence or self-report of active SI/HI or A/V H.  Laylamarie reported compliance with medication and denied use of alcohol or illicit substances.  Preesha reported scores of 4/10 for depression, 4/10 for anxiety, and 1/10 for irritability.  Ellaina denied any recent outbursts or panic attacks.  Geraldene reported that a recent success was going out with friends and family for [place] yesterday, which she enjoyed, stating "The kids were really cute".  Tamaka reported that an ongoing struggle has been dealing with her mother, but she was able to avoid being sarcastic in communication with her last night.  Russie reported that her goal today is to go to a flu clinic and take the  afternoon to relax.         Second Therapeutic Activity: Counselor introduced Einar Grad, [employer] Pharmacist, to provide psychoeducation on topic of medication compliance with members today.  Jiles Garter provided psychoeducation on classes of medications such as antidepressants, antipsychotics, what symptoms they are intended to treat, and any side effects one might encounter while on a particular prescription.  Time was allowed for clients to ask any questions they might have of [facility] regarding this specialty.  Intervention was effective, as evidenced by [name] participating in discussion with speaker on the subject, reporting that she has been curious about whether or not to take her Zoloft in the morning or evening.  Latifah was receptive to feedback from pharmacist regarding how to adjust medication regimen to reduce unwanted side effects.      Assessment and Plan: Counselor recommends that [place] remain in IOP treatment to better manage mental health symptoms, ensure stability and pursue completion of treatment plan goals. Counselor recommends adherence to crisis/safety plan, taking medications as prescribed, and following up with medical professionals if any issues arise.   Follow Up Instructions: Counselor will send Webex link for next session. Joann was advised to call back or seek an in-person evaluation if the symptoms worsen or if the condition fails to improve as anticipated.   Collaboration of Care:   Medication Management AEB Dr. Fatima Sanger or Ricky Ala, NP                                          Case Manager AEB Dellia Nims, CNA   Patient/Guardian  was advised Release of Information must be obtained prior to any record release in order to collaborate their care with an outside provider. Patient/Guardian was advised if they have not already done so to contact the registration department to sign all necessary forms in order for [place] to release information regarding their care.    Consent: Patient/Guardian gives verbal consent for treatment and assignment of benefits for services provided during this visit. Patient/Guardian expressed understanding and agreed to proceed.  I provided 180 minutes of non-face-to-face time during this encounter.   Shade Flood, [place], LCAS 02/05/22

## 2022-02-06 ENCOUNTER — Other Ambulatory Visit (HOSPITAL_COMMUNITY): Payer: BC Managed Care – PPO | Admitting: Licensed Clinical Social Worker

## 2022-02-06 ENCOUNTER — Ambulatory Visit (INDEPENDENT_AMBULATORY_CARE_PROVIDER_SITE_OTHER): Payer: BC Managed Care – PPO | Admitting: Psychology

## 2022-02-06 DIAGNOSIS — F332 Major depressive disorder, recurrent severe without psychotic features: Secondary | ICD-10-CM | POA: Diagnosis not present

## 2022-02-06 DIAGNOSIS — F902 Attention-deficit hyperactivity disorder, combined type: Secondary | ICD-10-CM | POA: Diagnosis not present
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
## 2022-02-06 NOTE — Progress Notes (Signed)
Virtual Visit via Video Note   I connected with [name] A. Bachtell on 02/06/22 at  9:00 AM EDT by a video enabled telemedicine application and verified that I am speaking with the correct person using two identifiers.   At orientation to the IOP program, Case Manager discussed the limitations of evaluation and management by telemedicine and the availability of in person appointments. The patient expressed understanding and agreed to proceed with virtual visits throughout the duration of the program.   Location:  Patient: Patient Home Provider: [facility]   History of Present Illness: MDD   Observations/Objective: Check In: Case Manager checked in with all participants to review discharge dates, insurance authorizations, work-related documents and needs from the treatment team regarding medications. Mieshia stated needs and engaged in discussion.    Initial Therapeutic Activity: Counselor facilitated a check-in with Jamilla to assess for safety, sobriety and medication compliance.  Counselor also inquired about Jeniyah's current emotional ratings, as well as any significant changes in thoughts, feelings or behavior since previous check in.  Wayne presented for session on time and was alert, oriented x5, with no evidence or self-report of active SI/HI or A/V H.  Trisha reported compliance with medication and denied use of alcohol or illicit substances.  Myria reported scores of 2/10 for depression, 4/10 for anxiety, and 0/10 for anger/irritability.  Danahi denied any recent outbursts or panic attacks.  Sabryn reported that a recent success was going out to dinner with a friend after attending a flu shot clinic.  She reported that she also outreached a friend to catch up over brunch this weekend.  Analina reported that a recent struggle has been maintaining a boundary with work.  Brandey reported that her goal today is to attend a virtual meeting and then finish some progress notes  from work.       Second Therapeutic Activity: Counselor engaged the group in discussion on managing work/life balance today to improve mental health and wellness.  Counselor explained how finding balance between responsibilities at home and work place can be challenging, and lead to increased stress.  Counselor facilitated discussion on what challenges members are currently, or have historically faced.  Counselor also discussed strategies for improving work/life balance while members work on their mental health during treatment.  Some of these included keeping track of time management; creating a list of priorities and scaling importance; setting realistic, measurable goals each day; establishing boundaries; taking care of health needs; and nurturing relationships at home and work for support.  Counselor inquired about areas where members feel they are excelling, as well as areas they could focus on during treatment. Intervention was effective, as evidenced by [name] actively participating in discussion on topic and reporting that she has felt 'time poor' more frequently, and has had days where she is working later than planned, feels burnt out, and neglects self-care.  Rayah reported that there have also been numerous warning signs such as working past normal hours even while on leave, stating "I feel like I tend to get emotionally involved because I have a good heart and care about people. That's why I went into this profession. It can weigh on you and break you though".  Justus was receptive to suggestions offered today for addressing work life imbalance, including setting healthier boundaries in the workplace to avoid overworking herself, which will require her to become more assertive so that she can confidently say "No" to unreasonable demands such as management adding to her caseload when  its already overwhelming.      Assessment and Plan: Counselor recommends that [place] remain in IOP treatment  to better manage mental health symptoms, ensure stability and pursue completion of treatment plan goals. Counselor recommends adherence to crisis/safety plan, taking medications as prescribed, and following up with medical professionals if any issues arise.   Follow Up Instructions: Counselor will send Webex link for next session. Sandria was advised to call back or seek an in-person evaluation if the symptoms worsen or if the condition fails to improve as anticipated.   Collaboration of Care:   Medication Management AEB Dr. Fatima Sanger or Ricky Ala, NP                                          Case Manager AEB Dellia Nims, CNA   Patient/Guardian was advised Release of Information must be obtained prior to any record release in order to collaborate their care with an outside provider. Patient/Guardian was advised if they have not already done so to contact the registration department to sign all necessary forms in order for [place] to release information regarding their care.   Consent: Patient/Guardian gives verbal consent for treatment and assignment of benefits for services provided during this visit. Patient/Guardian expressed understanding and agreed to proceed.  I provided 180 minutes of non-face-to-face time during this encounter.   Shade Flood, [place], LCAS 02/06/22

## 2022-02-07 ENCOUNTER — Other Ambulatory Visit (HOSPITAL_COMMUNITY): Payer: BC Managed Care – PPO | Admitting: Licensed Clinical Social Worker

## 2022-02-07 DIAGNOSIS — F332 Major depressive disorder, recurrent severe without psychotic features: Secondary | ICD-10-CM | POA: Diagnosis not present

## 2022-02-07 NOTE — Progress Notes (Signed)
Virtual Visit via Video Note   I connected with Caitlyn Harris on 02/07/22 at  9:00 AM EDT by a video enabled telemedicine application and verified that I am speaking with the correct person using two identifiers.   At orientation to the IOP program, Case Manager discussed the limitations of evaluation and management by telemedicine and the availability of in person appointments. The patient expressed understanding and agreed to proceed with virtual visits throughout the duration of the program.   Location:  Patient: Patient Home Provider: [facility] Office   History of Present Illness: MDD   Observations/Objective: Check In: Case Manager checked in with all participants to review discharge dates, insurance authorizations, work-related documents and needs from the treatment team regarding medications. Caitlyn Harris stated needs and engaged in discussion.    Initial Therapeutic Activity: Counselor facilitated a check-in with Caitlyn Harris to assess for safety, sobriety and medication compliance.  Counselor also inquired about Caitlyn Harris's current emotional ratings, as well as any significant changes in thoughts, feelings or behavior since previous check in.  Caitlyn Harris presented for session on time and was alert, oriented x5, with no evidence or self-report of active SI/HI or A/V H.  Caitlyn Harris reported compliance with medication and denied use of alcohol or illicit substances.  Caitlyn Harris reported scores of 4/10 for depression, 4/10 for anxiety, and 1/10 for anger/irritability.  Caitlyn Harris denied any recent outbursts or panic attacks.  Caitlyn Harris reported that a recent success was getting progress reports done yesterday, and having a meeting with her doctor, who diagnosed her with moderate ADHD.  She stated "Some of the things we went over were eye opening".  Caitlyn Harris reported that a recent struggle was having poor sleep, stating "I have a sore throat and sinus pressure".  Caitlyn Harris reported that her goal today is to  have brunch with a friend to catch up later this afternoon.       Second Therapeutic Activity: Counselor invited members to participate in peaceful place guided imagery activity today.  Counselor explained how this is a powerful visualization tool which can aid in reducing stress while increasing sense of calm, control, and awareness if practiced regularly.  Counselor informed members beforehand that if they became uncomfortable at any point during activity, they could stop and open their eyes.  Counselor invited members to get comfortable, achieve a relaxing breathing rhythm, close their eyes, and then guided them through process of creating a 'peaceful place' which filled them with safety and calm.  Counselor encouraged members to include sensory details involving vision, sound, touch, smell, and taste which they considered pleasant to enhance experience.  After 10 minutes of practice in session, counselor invited members to share their opinion on the activity, including whether they were able to imagine a specific place, what details stood out to them, and how this made them feel during and after.  Intervention was effective, as evidenced by Caitlyn Harris successfully participating in activity, and reporting that she visualized relaxing at her grandmothers home, where she imagined sitting in a rocking chair chatting with her grandmother watching birds outside while pot roast cooked.  Caitlyn Harris reported that her grandmother passed in June and she is still coping with this loss, but it was nice to reflect on peaceful moments with her growing up.    Third Therapeutic Activity: Psycho-educational portion of group was provided by Christie Beckers, [employer] of community education with [employer].  Alexandra provided information on history of her local agency, mission statement, and the variety of unique services offered  which group members might find beneficial to engage in, including both virtual and in-person  support groups, as well as peer support program for mentoring.  Alexandra offered time to answer member's questions regarding services and encouraged them to consider utilizing these services to assist in working towards their individual wellness goals.  Intervention effectiveness could not be measured, as Caitlyn Harris did not participate.    Assessment and Plan: Counselor recommends that Caitlyn Harris remain in IOP treatment to better manage mental health symptoms, ensure stability and pursue completion of treatment plan goals. Counselor recommends adherence to crisis/safety plan, taking medications as prescribed, and following up with medical professionals if any issues arise.   Follow Up Instructions: Counselor will send Webex link for next session. Caitlyn Harris was advised to call back or seek an in-person evaluation if the symptoms worsen or if the condition fails to improve as anticipated.   Collaboration of Care:   Medication Management AEB Dr. Fatima Sanger or Ricky Ala, NP                                          Case Manager AEB Dellia Nims, CNA   Patient/Guardian was advised Release of Information must be obtained prior to any record release in order to collaborate their care with an outside provider. Patient/Guardian was advised if they have not already done so to contact the registration department to sign all necessary forms in order for [place] to release information regarding their care.   Consent: Patient/Guardian gives verbal consent for treatment and assignment of benefits for services provided during this visit. Patient/Guardian expressed understanding and agreed to proceed.  I provided 180 minutes of non-face-to-face time during this encounter.   Shade Flood, [place], LCAS 02/07/22

## 2022-02-10 ENCOUNTER — Encounter: Payer: Self-pay | Admitting: Primary Care

## 2022-02-10 ENCOUNTER — Ambulatory Visit (INDEPENDENT_AMBULATORY_CARE_PROVIDER_SITE_OTHER): Payer: BC Managed Care – PPO | Admitting: Primary Care

## 2022-02-10 ENCOUNTER — Other Ambulatory Visit (HOSPITAL_COMMUNITY): Payer: BC Managed Care – PPO | Admitting: Licensed Clinical Social Worker

## 2022-02-10 VITALS — BP 148/84 | HR 104 | Temp 97.0°F | Ht 73.0 in | Wt 266.0 lb

## 2022-02-10 DIAGNOSIS — F332 Major depressive disorder, recurrent severe without psychotic features: Secondary | ICD-10-CM

## 2022-02-10 DIAGNOSIS — J029 Acute pharyngitis, unspecified: Secondary | ICD-10-CM

## 2022-02-10 DIAGNOSIS — J020 Streptococcal pharyngitis: Secondary | ICD-10-CM | POA: Diagnosis not present

## 2022-02-10 LAB — POCT RAPID STREP A (OFFICE): Rapid Strep A Screen: POSITIVE — AB

## 2022-02-10 LAB — POC COVID19 BINAXNOW: SARS Coronavirus 2 Ag: NEGATIVE

## 2022-02-10 MED ORDER — METHYLPREDNISOLONE ACETATE 80 MG/ML IJ SUSP
80.0000 mg | Freq: Once | INTRAMUSCULAR | Status: AC
  Administered 2022-02-10: 80 mg via INTRAMUSCULAR

## 2022-02-10 MED ORDER — AMOXICILLIN 500 MG PO CAPS: 500.0000 mg | ORAL_CAPSULE | Freq: Two times a day (BID) | ORAL | 0 refills | Status: AC

## 2022-02-10 NOTE — Progress Notes (Signed)
Subjective:    Patient ID: Caitlyn Harris, female    DOB: 12/30/1980, 41 y.o.   MRN: [id number]  Sore Throat  Associated symptoms include trouble swallowing. Pertinent negatives include no congestion or coughing.    Caitlyn Harris is a very pleasant 41 y.o. female patient of Dr. Glori Bickers with a history of chronic migraines, URI, GERD who presents today to discuss sore throat.   Symptom onset five days ago with sore throat. She's also developed fatigued, post nasal drip, painful swallowing. She denies rhinorrhea, cough, fevers. She's been taking Dayquil and Nyquil, and is taking Ricola.   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 She does have young kids. No one else in her home has these symptoms.   Review of Systems  Constitutional:  Positive for fatigue. Negative for fever.  HENT:  Positive for postnasal drip, sore throat and trouble swallowing. Negative for congestion.   Respiratory:  Negative for cough.          Past Medical History:  Diagnosis Date   Anxiety    Depression    Endometriosis    GERD (gastroesophageal reflux disease)    OCC   History of PCOS    Hyperlipidemia    Migraine    Seizures ([place])    FEBRILE AS A BABY    Social History   Socioeconomic History   Marital status: Married    Spouse name: Not on file   Number of children: 2   Years of education: Not on file   Highest education level: Master's degree (e.g., MA, MS, MEng, MEd, MSW, MBA)  Occupational History   Not on file  Tobacco Use   Smoking status: Former    Years: 7.00    Types: Cigarettes    Quit date: 03/12/2009    Years since quitting: 12.9   Smokeless tobacco: Never   Tobacco comments:    1 PACK EVERY 2 WEEKS  Vaping Use   Vaping Use: Never used  Substance and Sexual Activity   Alcohol use: Not Currently    Comment: RARE   Drug use: No   Sexual activity: Yes  Other Topics Concern   Not on file  Social History Narrative   Right handed   Social  Determinants of Health   Financial Resource Strain: Not on file  Food Insecurity: Not on file  Transportation Needs: Not on file  Physical Activity: Not on file  Stress: Not on file  Social Connections: Not on file  Intimate Partner Violence: Not on file    Past Surgical History:  Procedure Laterality Date   ABDOMINAL HYSTERECTOMY     CERVICAL BIOPSY  W/ LOOP ELECTRODE EXCISION     COLONOSCOPY WITH PROPOFOL N/A 01/16/2022   Procedure: COLONOSCOPY WITH PROPOFOL;  Surgeon: Jonathon Bellows, MD;  Location: [facility] ENDOSCOPY;  Service: Gastroenterology;  Laterality: N/A;   ECTOPIC PREGNANCY SURGERY     endometirosis     ESOPHAGOGASTRODUODENOSCOPY (EGD) WITH PROPOFOL N/A 01/16/2022   Procedure: ESOPHAGOGASTRODUODENOSCOPY (EGD) WITH PROPOFOL;  Surgeon: Jonathon Bellows, MD;  Location: [facility] ENDOSCOPY;  Service: Gastroenterology;  Laterality: N/A;   HAND SURGERY     THUMB SURGERY   LAPAROSCOPIC OVARIAN CYSTECTOMY Left 03/02/2018   Procedure: LAPAROSCOPIC RIGHT OVARIAN CYSTECTOMY;  Surgeon: Gae Dry, MD;  Location: [facility] ORS;  Service: Gynecology;  Laterality: Left;   LAPAROSCOPIC OVARIAN CYSTECTOMY Left 08/30/2019   Procedure: LAPAROSCOPIC OVARIAN CYSTECTOMY;  Surgeon: Gae Dry, MD;  Location: [facility]  ORS;  Service: Gynecology;  Laterality: Left;   LEEP     LEEP     PERINEOPLASTY N/A 03/02/2018   Procedure: PERINEORRHAPHY;  Surgeon: Gae Dry, MD;  Location: [facility] ORS;  Service: Gynecology;  Laterality: N/A;   RECTOCELE REPAIR N/A 03/02/2018   Procedure: POSTERIOR REPAIR (RECTOCELE);  Surgeon: Gae Dry, MD;  Location: [facility] ORS;  Service: Gynecology;  Laterality: N/A;   VENTRAL HERNIA REPAIR N/A 03/19/2017   Procedure: HERNIA REPAIR VENTRAL ADULT;  Surgeon: Florene Glen, MD;  Location: [facility] ORS;  Service: General;  Laterality: N/A;    Family History  Problem Relation Age of Onset   Hypertension Mother    Diabetes Mother    Renal cancer Father    Hypertension Father     Post-traumatic stress disorder Brother    Drug abuse Maternal Uncle    Alcohol abuse Maternal Uncle    Bipolar disorder Half-Sister    Bipolar disorder Niece    Suicidality Other     Allergies  Allergen Reactions   Tape     Low sensitivity to tape, ok with Tegaderm and paper tape     Current Outpatient Medications on File Prior to Visit  Medication Sig Dispense Refill   acetaminophen (TYLENOL) 500 MG tablet Take 1,000 mg by mouth every 6 (six) hours as needed for moderate pain or headache.     botulinum toxin Type A (BOTOX) 200 units injection Inject 155 units IM into multiple site in the face,neck and head once every 90 days 1 each 4   clonazePAM (KLONOPIN) 0.5 MG tablet Take 0.5-1 tablets (0.25-0.5 mg total) by mouth daily as needed for anxiety. Please limit use 10 tablet 0   hydrOXYzine (VISTARIL) 25 MG capsule TAKE 1-2 CAPSULES (25-50 MG TOTAL) BY MOUTH AT BEDTIME AS NEEDED. FOR SLEEP AND ANXIETY 180 capsule 0   ibuprofen (ADVIL) 400 MG tablet Take 400 mg by mouth every 6 (six) hours as needed.     omeprazole (PRILOSEC) 20 MG capsule Take 1 capsule (20 mg total) by mouth 2 (two) times daily before a meal. 90 capsule 2   ondansetron (ZOFRAN-ODT) 8 MG disintegrating tablet Take 1 tablet (8 mg total) by mouth every 8 (eight) hours as needed for nausea or vomiting. 20 tablet 0   sertraline (ZOLOFT) 25 MG tablet Take 1 tablet (25 mg total) by mouth daily with breakfast. 30 tablet 1   SUMAtriptan (IMITREX) 100 MG tablet MAY REPEAT IN 2 HOURS IF HEADACHE PERSISTS OR RECURS. 30 tablet 3   tiZANidine (ZANAFLEX) 4 MG capsule TAKE 1 CAPSULE BY MOUTH 3 TIMES DAILY AS NEEDED FOR MUSCLE SPASMS. 30 capsule 3   topiramate (TOPAMAX) 100 MG tablet TAKE 1 TABLET BY MOUTH TWICE A DAY 180 tablet 3   No current facility-administered medications on file prior to visit.    BP (!) 148/84   Pulse (!) 104   Temp (!) 97 F (36.1 C) (Temporal)   Ht '6\' 1"'$  (1.854 m)   Wt 266 lb (120.7 kg)   SpO2 98%    BMI 35.09 kg/m  Objective:   Physical Exam Constitutional:      Appearance: She is ill-appearing.  HENT:     Right Ear: Tympanic membrane and ear canal normal.     Left Ear: Tympanic membrane and ear canal normal.     Mouth/Throat:     Mouth: Mucous membranes are moist.     Pharynx: Pharyngeal swelling and posterior oropharyngeal erythema present. No oropharyngeal exudate.  Tonsils: No tonsillar exudate or tonsillar abscesses. 2+ on the right. 2+ on the left.  Cardiovascular:     Rate and Rhythm: Normal rate and regular rhythm.  Pulmonary:     Effort: Pulmonary effort is normal.     Breath sounds: Normal breath sounds.  Musculoskeletal:     Cervical back: Neck supple.  Skin:    General: Skin is warm and dry.           Assessment & Plan:   Problem List Items Addressed This Visit       Respiratory   Strep pharyngitis - Primary    Obvious on exam, POC strep test today positive.  IM Depo Medrol 80 mg provided for swelling today. Start Amoxil 500 mg BID x 10 days.  Discussed other home treatment. Follow up PRN.       Relevant Medications   amoxicillin (AMOXIL) 500 MG capsule     Other   Sore throat   Relevant Orders   POC COVID-19 (Completed)   POCT rapid strep A (Completed)       Pleas Koch, NP

## 2022-02-10 NOTE — Patient Instructions (Signed)
Start amoxicillin antibiotics. Take 1 tablet by mouth twice daily for 10 days.  Continue warm gargles.   Please follow up if no improvement.  It was a pleasure meeting you!

## 2022-02-10 NOTE — Addendum Note (Signed)
Addended by: Emelia Salisbury C on: 02/10/2022 01:30 PM   Modules accepted: Orders

## 2022-02-10 NOTE — Assessment & Plan Note (Signed)
Obvious on exam, POC strep test today positive.  IM Depo Medrol 80 mg provided for swelling today. Start Amoxil 500 mg BID x 10 days.  Discussed other home treatment. Follow up PRN.

## 2022-02-10 NOTE — Progress Notes (Signed)
Virtual Visit via Video Note   I connected with [name] A. Fager on 02/10/22 at  9:00 AM EDT by a video enabled telemedicine application and verified that I am speaking with the correct person using two identifiers.   At orientation to the IOP program, Case Manager discussed the limitations of evaluation and management by telemedicine and the availability of in person appointments. The patient expressed understanding and agreed to proceed with virtual visits throughout the duration of the program.   Location:  Patient: Patient Home Provider: [facility] [place] Office   History of Present Illness: MDD   Observations/Objective: Check In: Case Manager checked in with all participants to review discharge dates, insurance authorizations, work-related documents and needs from the treatment team regarding medications. Abbigael stated needs and engaged in discussion.    Initial Therapeutic Activity: Counselor facilitated a check-in with Chakira to assess for safety, sobriety and medication compliance.  Counselor also inquired about Ivis's current emotional ratings, as well as any significant changes in thoughts, feelings or behavior since previous check in.  [place] presented for session on time and was alert, oriented x5, with no evidence or self-report of active SI/HI or A/V H.  Michaeleen reported compliance with medication and denied use of alcohol or illicit substances.  Ameena reported scores of 2/10 for depression, 6/10 for anxiety, and 0/10 for anger/irritability.  Roderick denied any recent outbursts or panic attacks.  Chantelle reported that a recent success was going out to brunch with a friend over the weekend.  Shari reported that a recent struggle has been dealing with a sore throat that arose over the weekend and affected her mood.  Drishti reported that her goal today is to attend a visit with her doctor to address recent illness and pick up a new chair for her bedroom.       Second  Therapeutic Activity: Counselor introduced topic of grounding skills today.  Counselor defined these as simple strategies one can use to help detach from difficult thoughts or feelings temporarily by focusing on something else.  Counselor noted that grounding will not solve the problem at hand, but can provide the practitioner with time to regain control over their thoughts and/or feelings and prevent the situation from getting worse (i.e. interrupting a panic attack).  Counselor divided these into three categories (mental, physical, and soothing) and then provided examples of each which group members could practice during session.  Some of these included describing one's environment in detail or playing a categories game with oneself for mental category, taking a hot bath/shower, stretching, or carrying a grounding object for physical category, and saying kind statements, or visualizing people one cares about for soothing category.  Counselor inquired about which techniques members have used with success in the past, or will commit to learning, practicing, and applying now to improve coping abilities.  Intervention was effective, as evidenced by [name] participating in discussion on the subject, trying out several of the techniques during session, and expressing interest in adding several to her available coping skills, such as describing her environment in detail, describing an everyday activity in great detail, reading an engrossing romance novel, hugging or playing with her children, touching various objects in her environment, manipulating a grounding object like her father's ring, doing stretching activities, practicing deep breathing, telling herself kind or coping statements, and listening to spiritual music that is uplifting.    Assessment and Plan: Counselor recommends that [place] remain in IOP treatment to better manage mental health symptoms, ensure stability  and pursue completion of treatment  plan goals. Counselor recommends adherence to crisis/safety plan, taking medications as prescribed, and following up with medical professionals if any issues arise.   Follow Up Instructions: Counselor will send Webex link for next session. Mata was advised to call back or seek an in-person evaluation if the symptoms worsen or if the condition fails to improve as anticipated.   Collaboration of Care:   Medication Management AEB Dr. Fatima Sanger or Ricky Ala, NP                                          Case Manager AEB Dellia Nims, CNA   Patient/Guardian was advised Release of Information must be obtained prior to any record release in order to collaborate their care with an outside provider. Patient/Guardian was advised if they have not already done so to contact the registration department to sign all necessary forms in order for [place] to release information regarding their care.   Consent: Patient/Guardian gives verbal consent for treatment and assignment of benefits for services provided during this visit. Patient/Guardian expressed understanding and agreed to proceed.  I provided 180 minutes of non-face-to-face time during this encounter.   Shade Flood, [place], LCAS 02/10/22

## 2022-02-11 ENCOUNTER — Other Ambulatory Visit (HOSPITAL_COMMUNITY): Payer: BC Managed Care – PPO | Admitting: Licensed Clinical Social Worker

## 2022-02-11 DIAGNOSIS — F332 Major depressive disorder, recurrent severe without psychotic features: Secondary | ICD-10-CM

## 2022-02-11 DIAGNOSIS — F902 Attention-deficit hyperactivity disorder, combined type: Secondary | ICD-10-CM

## 2022-02-11 NOTE — Progress Notes (Signed)
Virtual Visit via Video Note   I connected with [name] A. Ambrosini on 02/11/22 at  9:00 AM EDT by a video enabled telemedicine application and verified that I am speaking with the correct person using two identifiers.   At orientation to the IOP program, Case Manager discussed the limitations of evaluation and management by telemedicine and the availability of in person appointments. The patient expressed understanding and agreed to proceed with virtual visits throughout the duration of the program.   Location:  Patient: Patient Home Provider: [facility] [place] [facility]   History of Present Illness: MDD   Observations/Objective: Check In: Case Manager checked in with all participants to review discharge dates, insurance authorizations, work-related documents and needs from the treatment team regarding medications. Kanylah stated needs and engaged in discussion.    Initial Therapeutic Activity: Counselor facilitated a check-in with Vearl to assess for safety, sobriety and medication compliance.  Counselor also inquired about Monice's current emotional ratings, as well as any significant changes in thoughts, feelings or behavior since previous check in.  [place] presented for session on time and was alert, oriented x5, with no evidence or self-report of active SI/HI or A/V H.  Alleyne reported compliance with medication and denied use of alcohol or illicit substances.  Salvador reported scores of 3/10 for depression, 6/10 for anxiety, and 0/10 for anger/irritability.  Rmoni denied any recent outbursts or panic attacks.  Deisha reported that a recent struggle was attending a doctor's appointment yesterday and learning that she has strep throat.  Katrice reported that she received treatment and intends to take things easier over the next few days as she recuperates.  She reported that a success was receiving a new lounging chair for her bedroom to relax in.  Kristiann reported that her goal today is to  coordinate with the children to take care of some chores to lighten the workload on her.       Second Therapeutic Activity: Counselor introduced [employer], [place] Chaplain to provide psychoeducation on topic of Grief and Loss with members today.  Estill Bamberg began discussion by checking in with the group about their baseline mood today, general thoughts on what grief means to them and how it has affected them personally in the past.  Estill Bamberg provided information on how the process of grief/loss can differ depending upon one's unique culture, and categories of loss one could experience (i.e. loss of a person, animal, relationship, job, identity, etc).  Estill Bamberg encouraged members to be mindful of how pervasive loss can be, and how to recognize signs which could indicate that this is having an impact on one's overall mental health and wellbeing.  Intervention was effective, as evidenced by [name] participating in discussion with speaker on the subject, reporting that this is a relevant topic to address her needs since she is still actively grieving the loss of family.  She reported that following the last session with our chaplain, she spoke to her mother about the different categories of loss, and this led to a productive discussion on how loss has affected them both.  She reported that something which brings her joy and helps her cope with loss is taking time to do positive activities with friends, such as a former coworker she met for brunch over the weekend.    Third Therapeutic Activity: Counselor discussed topic of gratitude journaling with members as a form of self-care.  Counselor virtually shared a handout with the group today which explained the benefits of this practice, including  reduction in stress, increased happiness, and self-esteem.  Tips were also provided to aid in practice, such as taking time with entries, writing about people one is grateful for, and setting goal for two entries per week for  at least 10-20 minutes at a time.  Counselor also provided group members with a variety of journaling prompts to choose from today, and encouraged each member to take time to write about something they are grateful for, with examples such as "Something beautiful I recently saw was..", "Something I can be proud of is.", "A reason to be excited for the future is." and more.  Members were encouraged to share their entry with the group, along with their perspective on the activity and motivation level towards making this a habit. Intervention was effective, as evidenced by [name] participating in journaling activity, and choosing the prompts "Someone whose company I enjoy is." and "Someone I can always rely on is.".  Taygen expressed gratitude for her children, stating "I'm obsessed with them. They make me feel loved and whole. They give the best hugs".  She also expressed gratitude for her husband, who is patient, cooks her nice meals, and has been supportive following recent challenges.      Assessment and Plan: Counselor recommends that [place] remain in IOP treatment to better manage mental health symptoms, ensure stability and pursue completion of treatment plan goals. Counselor recommends adherence to crisis/safety plan, taking medications as prescribed, and following up with medical professionals if any issues arise.   Follow Up Instructions: Counselor will send Webex link for next session. Lakeyta was advised to call back or seek an in-person evaluation if the symptoms worsen or if the condition fails to improve as anticipated.   Collaboration of Care:   Medication Management AEB Dr. Fatima Sanger or Ricky Ala, NP                                          Case Manager AEB Dellia Nims, CNA   Patient/Guardian was advised Release of Information must be obtained prior to any record release in order to collaborate their care with an outside provider. Patient/Guardian was advised if they have not  already done so to contact the registration department to sign all necessary forms in order for [place] to release information regarding their care.   Consent: Patient/Guardian gives verbal consent for treatment and assignment of benefits for services provided during this visit. Patient/Guardian expressed understanding and agreed to proceed.  I provided 180 minutes of non-face-to-face time during this encounter.   Shade Flood, [place], LCAS 02/11/22

## 2022-02-12 ENCOUNTER — Other Ambulatory Visit (HOSPITAL_COMMUNITY): Payer: BC Managed Care – PPO | Admitting: Licensed Clinical Social Worker

## 2022-02-12 ENCOUNTER — Encounter: Payer: Self-pay | Admitting: Psychiatry

## 2022-02-12 ENCOUNTER — Telehealth (INDEPENDENT_AMBULATORY_CARE_PROVIDER_SITE_OTHER): Payer: BC Managed Care – PPO | Admitting: Psychiatry

## 2022-02-12 DIAGNOSIS — F332 Major depressive disorder, recurrent severe without psychotic features: Secondary | ICD-10-CM | POA: Diagnosis not present

## 2022-02-12 DIAGNOSIS — F411 Generalized anxiety disorder: Secondary | ICD-10-CM | POA: Diagnosis not present

## 2022-02-12 DIAGNOSIS — R41840 Attention and concentration deficit: Secondary | ICD-10-CM

## 2022-02-12 DIAGNOSIS — Z634 Disappearance and death of family member: Secondary | ICD-10-CM | POA: Diagnosis not present

## 2022-02-12 MED ORDER — SERTRALINE HCL 50 MG PO TABS: 50.0000 mg | ORAL_TABLET | Freq: Every day | ORAL | 1 refills | Status: DC

## 2022-02-12 NOTE — Progress Notes (Signed)
Virtual Visit via Video Note  I connected with Caitlyn Harris on 02/12/22 at  3:00 PM EST by a video enabled telemedicine application and verified that I am speaking with the correct person using two identifiers.  Location Provider Location : [facility] Patient Location : Home  Participants: Patient , Provider   I discussed the limitations of evaluation and management by telemedicine and the availability of in person appointments. The patient expressed understanding and agreed to proceed.    I discussed the assessment and treatment plan with the patient. The patient was provided an opportunity to ask questions and all were answered. The patient agreed with the plan and demonstrated an understanding of the instructions.   The patient was advised to call back or seek an in-person evaluation if the symptoms worsen or if the condition fails to improve as anticipated.   Learned MD OP Progress Note  02/12/2022 4:09 PM LONNETTE SHRODE  MRN:  [id number]  Chief Complaint:  Chief Complaint  Patient presents with   Follow-up   Anxiety   Depression   Medication Refill   HPI: Caitlyn Harris is a 41 year old Caucasian female, married, employed, lives in [place], has a history of MDD, GAD, IBS, migraine headaches, hysterectomy, presented for medication management.  Patient today reports she is currently struggling with strep throat, currently on amoxicillin started by her primary care provider.  That has helped to some extent.  Patient reports she is currently in [facility] and that has been beneficial.  She has not decided about continuing [facility] since she has to take time off from work without pay.  There is also a lot of workload building up that also makes her anxious.  Patient reports she started taking the sertraline a week ago although it was prescribed on 01/21/2022.  She reports she wanted to make sure she was out of work before she started it.  She was worried about side effects.  She  however reports it is going well so far and currently does not have any side effects.  Reports she is interested in increasing the dosage.  Patient denies any suicidality, homicidality or perceptual disturbances.  Report she had ADHD testing completed by psychologist.  Agrees to sign an ROI to obtain medical records.  Patient denies any other concerns today.  Visit Diagnosis:    ICD-10-CM   1. Severe episode of recurrent major depressive disorder, without psychotic features (HCC)  F33.2 sertraline (ZOLOFT) 50 MG tablet    2. GAD (generalized anxiety disorder)  F41.1 sertraline (ZOLOFT) 50 MG tablet    3. Bereavement  Z63.4     4. Attention and concentration deficit  R41.840       Past Psychiatric History: Reviewed past psych History from progress note on 11/15/2021.  Past trials of medications like Celexa, Ambien, ADHD medications like stimulants-does not remember names.  Past Medical History:  Past Medical History:  Diagnosis Date   Anxiety    Depression    Endometriosis    GERD (gastroesophageal reflux disease)    OCC   History of PCOS    Hyperlipidemia    Migraine    Seizures ([place])    FEBRILE AS A BABY    Past Surgical History:  Procedure Laterality Date   ABDOMINAL HYSTERECTOMY     CERVICAL BIOPSY  W/ LOOP ELECTRODE EXCISION     COLONOSCOPY WITH PROPOFOL N/A 01/16/2022   Procedure: COLONOSCOPY WITH PROPOFOL;  Surgeon: Jonathon Bellows, MD;  Location: [facility];  Service:  Gastroenterology;  Laterality: N/A;   ECTOPIC PREGNANCY SURGERY     endometirosis     ESOPHAGOGASTRODUODENOSCOPY (EGD) WITH PROPOFOL N/A 01/16/2022   Procedure: ESOPHAGOGASTRODUODENOSCOPY (EGD) WITH PROPOFOL;  Surgeon: Jonathon Bellows, MD;  Location: [facility] ENDOSCOPY;  Service: Gastroenterology;  Laterality: N/A;   HAND SURGERY     THUMB SURGERY   LAPAROSCOPIC OVARIAN CYSTECTOMY Left 03/02/2018   Procedure: LAPAROSCOPIC RIGHT OVARIAN CYSTECTOMY;  Surgeon: Gae Dry, MD;  Location: [facility];   Service: Gynecology;  Laterality: Left;   LAPAROSCOPIC OVARIAN CYSTECTOMY Left 08/30/2019   Procedure: LAPAROSCOPIC OVARIAN CYSTECTOMY;  Surgeon: Gae Dry, MD;  Location: [facility];  Service: Gynecology;  Laterality: Left;   LEEP     LEEP     PERINEOPLASTY N/A 03/02/2018   Procedure: PERINEORRHAPHY;  Surgeon: Gae Dry, MD;  Location: [facility];  Service: Gynecology;  Laterality: N/A;   RECTOCELE REPAIR N/A 03/02/2018   Procedure: POSTERIOR REPAIR (RECTOCELE);  Surgeon: Gae Dry, MD;  Location: [facility];  Service: Gynecology;  Laterality: N/A;   VENTRAL HERNIA REPAIR N/A 03/19/2017   Procedure: HERNIA REPAIR VENTRAL ADULT;  Surgeon: Florene Glen, MD;  Location: [facility];  Service: General;  Laterality: N/A;    Family Psychiatric History: Reviewed family psychiatric history from progress note on 11/15/2021.  Family History:  Family History  Problem Relation Age of Onset   Hypertension Mother    Diabetes Mother    Renal cancer Father    Hypertension Father    Post-traumatic stress disorder Brother    Drug abuse Maternal Uncle    Alcohol abuse Maternal Uncle    Bipolar disorder Half-Sister    Bipolar disorder Niece    Suicidality Other     Social History: Reviewed social history from progress note on 11/15/2021. Social History   Socioeconomic History   Marital status: Married    Spouse name: Not on file   Number of children: 2   Years of education: Not on file   Highest education level: Master's degree (e.g., MA, MS, MEng, MEd, MSW, MBA)  Occupational History   Not on file  Tobacco Use   Smoking status: Former    Years: 7.00    Types: Cigarettes    Quit date: 03/12/2009    Years since quitting: 12.9   Smokeless tobacco: Never   Tobacco comments:    1 PACK EVERY 2 WEEKS  Vaping Use   Vaping Use: Never used  Substance and Sexual Activity   Alcohol use: Not Currently    Comment: RARE   Drug use: No   Sexual activity: Yes  Other Topics Concern    Not on file  Social History Narrative   Right handed   Social Determinants of Health   Financial Resource Strain: Not on file  Food Insecurity: Not on file  Transportation Needs: Not on file  Physical Activity: Not on file  Stress: Not on file  Social Connections: Not on file    Allergies:  Allergies  Allergen Reactions   Tape     Low sensitivity to tape, ok with Tegaderm and paper tape     Metabolic Disorder Labs: Lab Results  Component Value Date   HGBA1C 4.7 08/17/2017   Lab Results  Component Value Date   PROLACTIN 13.3 12/21/2017   Lab Results  Component Value Date   CHOL 142 06/07/2018   TRIG 193.0 (H) 06/07/2018   HDL 28.00 (L) 06/07/2018   CHOLHDL 5 06/07/2018   VLDL 38.6 06/07/2018  [place] 75 06/07/2018   Lab Results  Component Value Date   TSH 1.57 06/26/2021   TSH 0.89 08/17/2017    Therapeutic Level Labs: No results found for: "LITHIUM" No results found for: "VALPROATE" No results found for: "CBMZ"  Current Medications: Current Outpatient Medications  Medication Sig Dispense Refill   acetaminophen (TYLENOL) 500 MG tablet Take 1,000 mg by mouth every 6 (six) hours as needed for moderate pain or headache.     amoxicillin (AMOXIL) 500 MG capsule Take 1 capsule (500 mg total) by mouth 2 (two) times daily for 10 days. 20 capsule 0   botulinum toxin Type A (BOTOX) 200 units injection Inject 155 units IM into multiple site in the face,neck and head once every 90 days 1 each 4   ibuprofen (ADVIL) 400 MG tablet Take 400 mg by mouth every 6 (six) hours as needed.     omeprazole (PRILOSEC) 20 MG capsule Take 1 capsule (20 mg total) by mouth 2 (two) times daily before a meal. 90 capsule 2   ondansetron (ZOFRAN-ODT) 8 MG disintegrating tablet Take 1 tablet (8 mg total) by mouth every 8 (eight) hours as needed for nausea or vomiting. 20 tablet 0   sertraline (ZOLOFT) 50 MG tablet Take 1 tablet (50 mg total) by mouth daily. 30 tablet 1   SUMAtriptan  (IMITREX) 100 MG tablet MAY REPEAT IN 2 HOURS IF HEADACHE PERSISTS OR RECURS. 30 tablet 3   tiZANidine (ZANAFLEX) 4 MG capsule TAKE 1 CAPSULE BY MOUTH 3 TIMES DAILY AS NEEDED FOR MUSCLE SPASMS. 30 capsule 3   topiramate (TOPAMAX) 100 MG tablet TAKE 1 TABLET BY MOUTH TWICE A DAY 180 tablet 3   clonazePAM (KLONOPIN) 0.5 MG tablet Take 0.5-1 tablets (0.25-0.5 mg total) by mouth daily as needed for anxiety. Please limit use (Patient not taking: Reported on 02/12/2022) 10 tablet 0   hydrOXYzine (VISTARIL) 25 MG capsule TAKE 1-2 CAPSULES (25-50 MG TOTAL) BY MOUTH AT BEDTIME AS NEEDED. FOR SLEEP AND ANXIETY (Patient not taking: Reported on 02/12/2022) 180 capsule 0   No current facility-administered medications for this visit.     Musculoskeletal: Strength & Muscle Tone: within normal limits Gait & Station: normal Patient leans: N/A  Psychiatric Specialty Exam: Review of Systems  HENT:  Positive for sore throat.   Psychiatric/Behavioral:  Positive for decreased concentration and dysphoric mood. The patient is nervous/anxious.   All other systems reviewed and are negative.   There were no vitals taken for this visit.There is no height or weight on file to calculate BMI.  General Appearance: Casual  Eye Contact:  Fair  Speech:  Clear and Coherent  Volume:  Normal  Mood:  Anxious and Depressed  Affect:  Congruent  Thought Process:  Goal Directed and Descriptions of Associations: Intact  Orientation:  Full (Time, Place, and Person)  Thought Content: Logical   Suicidal Thoughts:  No  Homicidal Thoughts:  No  Memory:  Immediate;   Fair Recent;   Fair Remote;   Fair  Judgement:  Fair  Insight:  Fair  Psychomotor Activity:  Normal  Concentration:  Concentration: Fair and Attention Span: Fair  Recall:  [employer] of Knowledge: Fair  Language: Fair  Akathisia:  No  Handed:  Right  AIMS (if indicated): not done  Assets:  Communication Skills Desire for  Improvement Housing Intimacy Social Support Talents/Skills Transportation  ADL's:  Intact  Cognition: WNL  Sleep:  Fair   Screenings: GAD-7    Personnel officer Visit  from 01/21/2022 in [facility] Visit from 12/11/2021 in [facility] Office Visit from 11/15/2021 in [facility] Office Visit from 08/11/2018 in [place] at [facility] Visit from 06/11/2018 in [facility] at [facility]  Total GAD-7 Score '14 7 11 3 8      '$ PHQ2-9    [place] from 01/28/2022 in [place] Office Visit from 01/21/2022 in [place] [facility] Office Visit from 12/11/2021 in [facility] Office Visit from 11/15/2021 in [facility] Video Visit from 02/06/2021 in [facility] at [place]  PHQ-2 Total Score '4 5 2 6 '$ 0  PHQ-9 Total Score '18 21 12 20 9      '$ Flowsheet Row Video Visit from 02/12/2022 in [facility] Counselor from 01/28/2022 in [place] [facility] Office Visit from 01/21/2022 in [place]  C-SSRS RISK CATEGORY Low Risk Error: Question 6 not populated Low Risk        Assessment and Plan: LAPORSHA GREALISH is a 41 year old Caucasian female, currently employed, married, lives in [place], has a history of depression, anxiety, migraine headaches, IBS, recent GI bleed currently resolved was evaluated by telemedicine today.  Patient currently recovering from strep throat, currently on amoxicillin, currently in [facility] IOP for her depression and anxiety, tolerating the sertraline well, will benefit from continued medication management to address her mood, plan as noted below.  Plan GAD-some improvement Increase sertraline to 50 mg p.o. daily. Hydroxyzine 25-50 mg at bedtime as needed. Klonopin  0.25-0.5 mg as needed for severe anxiety attacks-she has not been using it although it is available. Continue [facility] IOP for now.  MDD-improving Increase sertraline to 50 mg p.o. daily Continue [facility] IOP. Patient referred for CBT-community resources were provided.  Bereavement-improving Referred for CBT  Attention and concentration deficit-unstable Patient had psychological testing completed-reports she was diagnosed with ADHD.  Patient advised to sign an ROI to obtain medical records so we can discuss medication management.  Patient to continue to follow up with primary care provider for her strep throat.  Follow-up in clinic in 4 to 6 weeks or sooner if needed.    Collaboration of Care: Collaboration of Care: Primary Care Provider AEB encouraged to follow up with primary care provider for strep throat.  Patient/Guardian was advised Release of Information must be obtained prior to any record release in order to collaborate their care with an outside provider. Patient/Guardian was advised if they have not already done so to contact the registration department to sign all necessary forms in order for [place] to release information regarding their care.   Consent: Patient/Guardian gives verbal consent for treatment and assignment of benefits for services provided during this visit. Patient/Guardian expressed understanding and agreed to proceed.   This note was generated in part or whole with voice recognition software. Voice recognition is usually quite accurate but there are transcription errors that can and very often do occur. I apologize for any typographical errors that were not detected and corrected.      Ursula Alert, MD 02/12/2022, 4:09 PM

## 2022-02-12 NOTE — Progress Notes (Signed)
Virtual Visit via Video Note   I connected with Caitlyn Harris on 02/12/22 at  9:00 AM EDT by a video enabled telemedicine application and verified that I am speaking with the correct person using two identifiers.   At orientation to the IOP program, Case Manager discussed the limitations of evaluation and management by telemedicine and the availability of in person appointments. The patient expressed understanding and agreed to proceed with virtual visits throughout the duration of the program.   Location:  Patient: Patient Home Provider: [facility] [place] Office   History of Present Illness: MDD   Observations/Objective: Check In: Case Manager checked in with all participants to review discharge dates, insurance authorizations, work-related documents and needs from the treatment team regarding medications. Caitlyn Harris stated needs and engaged in discussion.    Initial Therapeutic Activity: Counselor facilitated a check-in with Caitlyn Harris to assess for safety, sobriety and medication compliance.  Counselor also inquired about Caitlyn Harris's current emotional ratings, as well as any significant changes in thoughts, feelings or behavior since previous check in.  Caitlyn Harris presented for session on time and was alert, oriented x5, with no evidence or self-report of active SI/HI or A/V H.  Caitlyn Harris reported compliance with medication and denied use of alcohol or illicit substances.  Caitlyn Harris reported scores of 3/10 for depression, 10/10 for anxiety, and 0/10 for anger/irritability.  Caitlyn Harris denied any recent panic attacks.  Caitlyn Harris reported that a recent struggle was having an outburst yesterday at her mother, and dealing with a headache today, stating "It's a lot".  Caitlyn Harris reported that her goal today is to finish a report for her job, even though she is on leave and has been encouraged to set a boundary.        Second Therapeutic Activity: Counselor utilized a Radio broadcast assistant with group members today to guide  discussion on topic of codependency.  This handout defined codependency as excessive emotional or psychological reliance upon someone who requires support on account of an illness or addiction.  It also explained how this issue presents in dysfunctional family systems, including behavior such as denying existence of problems, rigid boundaries on communication, strained trust, lack of individuality, and reinforcement of unhealthy coping mechanisms such as substance use.  Characteristics of co-dependent people were listed for assistance with identification, such as extreme need for approval/recognition, difficulty identifying feelings, poor communication, and more.  Members were also tasked with completing a questionnaire in order to identify signs of codependency and results were discussed afterward.  This handout also offered strategies for resolving co-dependency within one's network, including increased use of assertive communication skills in order to set appropriate boundaries.  Intervention was effective, as evidenced by Caitlyn Harris actively participating in discussion on the subject, and completing codependency questionnaire, with 5 out of 20 positive responses.  Caitlyn Harris reported that she did not grow up in a dysfunctional household, as her parents were "Very lovey and huggy", which she has passed onto her children.  Caitlyn Harris stated "I do think my husband came from a dysfunctional family. I see my husband in this, he has problems with anger, identifying feelings, intimacy, and trusting others. I'm starting to understand why he is like that.  This really opened up things for me because we speak so differently, and that can cause problems".  She reported that her goal will be to sit down and discuss this subject with her husband so that they can improve the relationship and avoid development of codependency.      Assessment and Plan:  Counselor recommends that Caitlyn Harris remain in IOP treatment to better manage  mental health symptoms, ensure stability and pursue completion of treatment plan goals. Counselor recommends adherence to crisis/safety plan, taking medications as prescribed, and following up with medical professionals if any issues arise.   Follow Up Instructions: Counselor will send Webex link for next session. Caitlyn Harris was advised to call back or seek an in-person evaluation if the symptoms worsen or if the condition fails to improve as anticipated.   Collaboration of Care:   Medication Management AEB Dr. Fatima Sanger or Ricky Ala, NP                                          Case Manager AEB Dellia Nims, CNA   Patient/Guardian was advised Release of Information must be obtained prior to any record release in order to collaborate their care with an outside provider. Patient/Guardian was advised if they have not already done so to contact the registration department to sign all necessary forms in order for [place] to release information regarding their care.   Consent: Patient/Guardian gives verbal consent for treatment and assignment of benefits for services provided during this visit. Patient/Guardian expressed understanding and agreed to proceed.  I provided 180 minutes of non-face-to-face time during this encounter.   Shade Flood, [place], LCAS 02/12/22

## 2022-02-13 ENCOUNTER — Other Ambulatory Visit (HOSPITAL_COMMUNITY): Payer: BC Managed Care – PPO | Admitting: Licensed Clinical Social Worker

## 2022-02-13 DIAGNOSIS — F332 Major depressive disorder, recurrent severe without psychotic features: Secondary | ICD-10-CM

## 2022-02-13 NOTE — Progress Notes (Signed)
Virtual Visit via Video Note   I connected with [name] A. Sidener on 02/13/22 at  9:00 AM EDT by a video enabled telemedicine application and verified that I am speaking with the correct person using two identifiers.   At orientation to the IOP program, Case Manager discussed the limitations of evaluation and management by telemedicine and the availability of in person appointments. The patient expressed understanding and agreed to proceed with virtual visits throughout the duration of the program.   Location:  Patient: Patient Home Provider: Clinical Home Office   History of Present Illness: MDD   Observations/Objective: Check In: Case Manager checked in with all participants to review discharge dates, insurance authorizations, work-related documents and needs from the treatment team regarding medications. Zilpha stated needs and engaged in discussion.    Initial Therapeutic Activity: Counselor facilitated a check-in with Kayt[place] to assess for safety, sobriety and medication compliance.  Counselor also inquired about Miko's current emotional ratings, as well as any significant changes in thoughts, feelings or behavior since previous check in.  [place] presented for session on time and was alert, oriented x5, with no evidence or self-report of active SI/HI or A/V H.  Clarissa reported compliance with medication and denied use of alcohol or illicit substances.  Trella reported scores of 1/10 for depression, 0/10 for anxiety, and 0/10 for anger/irritability.  Leinani denied any recent outbursts or panic attacks.  Jozy reported that a recent success was watching a comedy with family last night for self-care.  Anihya reported that a recent struggle was getting into an argument with her husband about doing a work assignment while on leave.  Deshea reported that her goal today is to take her son to the orthodontist and then do something outside in the nice weather.         Second  Therapeutic Activity: Counselor covered topic of core beliefs with group today.  Counselor virtually shared a handout on the subject, which explained how everyone looks at the world differently, and two people can have the same experience, but have different interpretations of what happened.  Members were encouraged to think of these like sunglasses with different "shades" influencing perception towards positive or negative outcomes.  Examples of negative core beliefs were provided, such as "I'm unlovable", "I'm not good enough", and "I'm a bad person".  Members were asked to share which one(s) they could relate to, and then identify evidence which contradicts these beliefs.  Counselor also provided psychoeducation on positive affirmations today.  Counselor explained how these are positive statements which can be spoken out loud or recited mentally to challenge negative thoughts and/or core beliefs to improve mood and outlook each day.  Counselor shared a comprehensive list of affirmations virtually to members with different categories, including ones for health, confidence, success, and happiness.  Counselor invited members to look through this list and identify any which resonated with them, and practice saying them out loud with sincerity.  Intervention was effective, as evidenced by [name] successfully participating in discussion on the subject and reporting that she could relate to several negative core beliefs listed on the handout, such as "I am ugly" and "I am abnormal".  Lianny was able to successfully challenge these beliefs by listing evidence which contradicted them, reporting that her kids and husband compliment her on her looks and recognize her beauty, and since joining this support group she has received feedback from peers that helps her realize she is not abnormal, and has several positive qualities.  Kirstie also reported that she liked several of the positive affirmations listed, such as  "My self worth is not determined by a number on a scale", "Failure is great feedback", and "I learn from my challenges and find ways to overcome them".    Assessment and Plan: Counselor recommends that [place] remain in IOP treatment to better manage mental health symptoms, ensure stability and pursue completion of treatment plan goals. Counselor recommends adherence to crisis/safety plan, taking medications as prescribed, and following up with medical professionals if any issues arise.   Follow Up Instructions: Counselor will send Webex link for next session. [place] was advised to call back or seek an in-person evaluation if the symptoms worsen or if the condition fails to improve as anticipated.   Collaboration of Care:   Medication Management AEB Dr. Fatima Sanger or Ricky Ala, NP                                          Case Manager AEB Dellia Nims, CNA   Patient/Guardian was advised Release of Information must be obtained prior to any record release in order to collaborate their care with an outside provider. Patient/Guardian was advised if they have not already done so to contact the registration department to sign all necessary forms in order for [place] to release information regarding their care.   Consent: Patient/Guardian gives verbal consent for treatment and assignment of benefits for services provided during this visit. Patient/Guardian expressed understanding and agreed to proceed.  I provided 180 minutes of non-face-to-face time during this encounter.   Shade Flood, [place], LCAS 02/13/22

## 2022-02-14 ENCOUNTER — Other Ambulatory Visit (HOSPITAL_COMMUNITY): Payer: BC Managed Care – PPO | Admitting: Licensed Clinical Social Worker

## 2022-02-14 DIAGNOSIS — F332 Major depressive disorder, recurrent severe without psychotic features: Secondary | ICD-10-CM

## 2022-02-14 NOTE — Progress Notes (Signed)
Virtual Visit via Video Note   I connected with [name] A. Folta on 02/14/22 at  9:00 AM EDT by a video enabled telemedicine application and verified that I am speaking with the correct person using two identifiers.   At orientation to the IOP program, Case Manager discussed the limitations of evaluation and management by telemedicine and the availability of in person appointments. The patient expressed understanding and agreed to proceed with virtual visits throughout the duration of the program.   Location:  Patient: Patient Home Provider: Clinical Home Office   History of Present Illness: MDD   Observations/Objective: Check In: Case Manager checked in with all participants to review discharge dates, insurance authorizations, work-related documents and needs from the treatment team regarding medications. Yanelly stated needs and engaged in discussion.    Initial Therapeutic Activity: Counselor facilitated a check-in with Alivea to assess for safety, sobriety and medication compliance.  Counselor also inquired about Jetaun's current emotional ratings, as well as any significant changes in thoughts, feelings or behavior since previous check in.  [place] presented for session on time and was alert, oriented x5, with no evidence or self-report of active SI/HI or A/V H.  Sareena reported compliance with medication and denied use of alcohol or illicit substances.  Ashlyne reported scores of 1/10 for depression, 2/10 for anxiety, and 0/10 for anger/irritability.  Tiffanni denied any recent outbursts or panic attacks.  Keir reported that a recent success was getting out of the house to do some shopping and get dinner yesterday.  Shewanda reported that a recent struggle was having a heart to heart talk with her son about some of his worries, although she was able to provide some encouragement.  Joline reported that her goal today is to meet with some friends for dinner to socialize and catch  up.       Second Therapeutic Activity: Counselor proposed practice of guided imagery exercise with members today to improve their relaxation and stress management skills.  This visualization featured a casual walk through a nature trail in a forest on a pleasant fall day.  Counselor invited members to get comfortable, achieve a relaxing breathing pattern, and then began narration of this visualization, including various sensory details (i.e. feeling of the sun on the skin, smell of pine trees, sound of running stream and breeze through leaves) to enhance experience over course of activity.  Counselor processed experience afterward with each member, inquiring about effectiveness of activity, any issues that may have arisen, and whether they intend to add this to self-care routine.  Intervention was effective, as evidenced by [name] participating in activity successfully and reporting that although she is not into nature settings, she is familiar with this type of relaxation practice and was able to successfully imagine various sensory details on the walk, and would continue to implement this activity for self-care by exploring alternative settings that are more preferable.      Third Therapeutic Activity: Counselor also acknowledged 2 graduating group members today by prompting each one to reflect on progress made since beginning the MHIOP program, notable takeaways from sessions attended, challenges overcome, and plan for continued care following discharge. Counselor and group members shared observations of growth, words of encouragement and support as these individuals transitioned out of the program today.   Russell participated in activity by offering words of encouragement and support for both departing members, reporting that she greatly enjoyed getting to know them, and felt that their presence and contributions made the experience more  worthwhile for her.         Assessment and Plan: Counselor  recommends that [place] remain in IOP treatment to better manage mental health symptoms, ensure stability and pursue completion of treatment plan goals. Counselor recommends adherence to crisis/safety plan, taking medications as prescribed, and following up with medical professionals if any issues arise.   Follow Up Instructions: Counselor will send Webex link for next session. Cheresa was advised to call back or seek an in-person evaluation if the symptoms worsen or if the condition fails to improve as anticipated.   Collaboration of Care:   Medication Management AEB Dr. Fatima Sanger or Ricky Ala, NP                                          Case Manager AEB Dellia Nims, CNA   Patient/Guardian was advised Release of Information must be obtained prior to any record release in order to collaborate their care with an outside provider. Patient/Guardian was advised if they have not already done so to contact the registration department to sign all necessary forms in order for [place] to release information regarding their care.   Consent: Patient/Guardian gives verbal consent for treatment and assignment of benefits for services provided during this visit. Patient/Guardian expressed understanding and agreed to proceed.  I provided 180 minutes of non-face-to-face time during this encounter.   Shade Flood, [place], LCAS 02/14/22

## 2022-02-17 ENCOUNTER — Other Ambulatory Visit (HOSPITAL_COMMUNITY): Payer: BC Managed Care – PPO | Admitting: Professional

## 2022-02-17 DIAGNOSIS — F332 Major depressive disorder, recurrent severe without psychotic features: Secondary | ICD-10-CM | POA: Diagnosis not present

## 2022-02-17 DIAGNOSIS — F411 Generalized anxiety disorder: Secondary | ICD-10-CM

## 2022-02-18 ENCOUNTER — Other Ambulatory Visit (HOSPITAL_COMMUNITY): Payer: BC Managed Care – PPO | Admitting: Psychiatry

## 2022-02-18 DIAGNOSIS — F332 Major depressive disorder, recurrent severe without psychotic features: Secondary | ICD-10-CM

## 2022-02-18 DIAGNOSIS — F411 Generalized anxiety disorder: Secondary | ICD-10-CM
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
## 2022-02-19 ENCOUNTER — Other Ambulatory Visit (HOSPITAL_COMMUNITY): Payer: BC Managed Care – PPO | Admitting: Licensed Clinical Social Worker

## 2022-02-19 ENCOUNTER — Telehealth (HOSPITAL_COMMUNITY): Payer: Self-pay | Admitting: Psychiatry

## 2022-02-20 ENCOUNTER — Encounter (HOSPITAL_COMMUNITY): Payer: Self-pay | Admitting: Psychiatry

## 2022-02-20 ENCOUNTER — Other Ambulatory Visit (HOSPITAL_COMMUNITY): Payer: BC Managed Care – PPO | Admitting: Psychiatry

## 2022-02-20 ENCOUNTER — Encounter (HOSPITAL_COMMUNITY): Payer: Self-pay

## 2022-02-20 DIAGNOSIS — F332 Major depressive disorder, recurrent severe without psychotic features: Secondary | ICD-10-CM | POA: Diagnosis not present
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
## 2022-02-20 NOTE — Patient Instructions (Addendum)
D:  Patient will complete MH-IOP tomorrow (02-21-22).  A:  Follow up with Dr. Shea Evans on 04-10-22 @ 9 a.m.; pt is currently on a wait list to see Verl Blalock, LCSW.  Provided patient with other therapists/practices names also.  Encourage support groups.  Return to work, without any restrictions.  R: Patient receptive.

## 2022-02-20 NOTE — Progress Notes (Addendum)
Virtual Visit via Video Note  I connected with Caitlyn Harris on '@TODAY'$ @ at  9:00 AM EST by a video enabled telemedicine application and verified that I am speaking with the correct person using two identifiers.  Location: Patient: at home Provider: at office   I discussed the limitations of evaluation and management by telemedicine and the availability of in person appointments. The patient expressed understanding and agreed to proceed.  I discussed the assessment and treatment plan with the patient. The patient was provided an opportunity to ask questions and all were answered. The patient agreed with the plan and demonstrated an understanding of the instructions.   The patient was advised to call back or seek an in-person evaluation if the symptoms worsen or if the condition fails to improve as anticipated.  I provided 30 minutes of non-face-to-face time during this encounter.   Dellia Nims, M.Ed,CNA   Patient ID: Caitlyn Harris, female   DOB: 07-May-1980, 41 y.o.   MRN: [id number] As per previous CCA states:  This is a 41 yr old, married, employed, Caucasian female, who was referred per Dr.Eappen d/t worsening depressive/anxiety symptoms.  According to pt, her sx's worsened October 11, 2021.  Pt denies SI/HI or A/V hallucinations.  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  Mother has medical issues and wasn't taking care of herself after husband died four yrs ago.  "I thought it was going to be temporary, but it looks like it will be permanent.  "She's very sweet but I find myself getting very irritated and being short with her.  I hate that because I am not that person."  2) Marriage of 11 yrs:  According to pt, by mom living with them , it has put a strain on her marriage.  3) Unresolved grief/loss issues:  MGM died 2021-10-11 and 12-11-17 father passed d/t cancer.  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  "I'm not functioning like I would normally at work (ie. not able to keep up with tasks or meeting my deadlines,etc."  5) Medical Issues:  GI Issues and Migraines.  Pt denies any prior psychiatric hospitalizations or suicide attempts/gestures.  Has been seeing Dr. Shea Evans since 12/11/21 and pt needs a therapist.  Family hx:  Brother (PTSD from [employer]), Deceased M-Uncle (drugs/ETOH), Sister (depression and anxiety), Mom (Depression) and deceased father (depression).   Current Symptoms/Problems: Sadness, teafulness, poor concentration, isolative, irritable, no motivation, no energy, anhedonia, poor sleep (difficulty getting and staying asleep), decreased in appetite, ruminating thoughts, anxiety, poor self-esteem, feelings of hopelessness      Pt has completed all scheduled MH-IOP days.  As of today, pt has attended twelve days with tomorrow being her last day.  Pt reports overall feeling better; but c/o continuing to struggle with verbally lashing out at her mother at times.  Pt reports that her mother will be going to reside with her sister temporarily in December 2023, in order to give pt respite.  On a scale of 1-10 (10 being the worst) pt rates her depression at a 1 and anxiety at a 0.  Denies SI/HI or A/V hallucinations.  RTW on 02-24-22 without any restrictions.  States she plans to go up to 50 mg Zoloft on 02-24-22 per Dr. Charlcie Cradle recommendation. Pt states that the groups were very helpful. A:  D/C tomorrow. Pt was advised  of ROI must be obtained prior to any records release in order to collaborate her care with an outside provider.  Pt was advised if she has not already done so to contact the front desk to sign all necessary forms in order for MH-IOP to release info re: her care. Consent:  Pt gives verbal consent for tx and assignment of benefits for services provided during this telehealth group process.  Pt expressed understanding and agreed to  proceed. Collaboration of care:  Collaborate with Dr. Kai Levins AEB, Dr. Lyndel Pleasure, and Shade Flood, [place].  Will refer pt to a therapist (on a waitlist for [facility], LCSW).  F/U with Dr. Shea Evans on 04-10-22 @ 9 a.m.Marland Kitchen  Strongly encouraged support groups.   RTW on 02-24-22 without any restrictions.  R:  Pt receptive.  Dellia Nims, M.Ed,CNA     .

## 2022-02-20 NOTE — Progress Notes (Signed)
Virtual Visit via Video Note   I connected with Caitlyn Harris on 02/20/22 at  9:00 AM EDT by a video enabled telemedicine application and verified that I am speaking with the correct person using two identifiers.   At orientation to the IOP program, Case Manager discussed the limitations of evaluation and management by telemedicine and the availability of in person appointments. The patient expressed understanding and agreed to proceed with virtual visits throughout the duration of the program.   Location:  Patient: Patient Home Provider: Clinical Home Office   History of Present Illness: MDD   Observations/Objective: Check In: Case Manager checked in with all participants to review discharge dates, insurance authorizations, work-related documents and needs from the treatment team regarding medications. Caitlyn Harris stated needs and engaged in discussion.    Initial Therapeutic Activity: Counselor facilitated a check-in with Caitlyn Harris to assess for safety, sobriety and medication compliance.  Counselor also inquired about Caitlyn Harris's current emotional ratings, as well as any significant changes in thoughts, feelings or behavior since previous check in.  Caitlyn Harris presented for session on time and was alert, oriented x5, with no evidence or self-report of active SI/HI or A/V H.  Caitlyn Harris reported compliance with medication and denied use of alcohol or illicit substances.  Caitlyn Harris reported scores of 1/10 for depression, 0/10 for anxiety, and 0/10 for anger/irritability.  Caitlyn Harris denied any recent outbursts or panic attacks.  Caitlyn Harris reported that a recent success was accompanying her son on a field trip yesterday, which she enjoyed since they got to spend time together.  Caitlyn Harris reported that a struggle was having her son twist his ankle, which had her concerned.  Caitlyn Harris reported that her goal today is to attend a dinner with 2 friends for self-care.       Second Therapeutic Activity: Counselor  introduced topic of assertive communication today.  Counselor shared various handouts with members virtually in group to read along with on the subject.  These handouts defined assertive communication as a communication style in which a person stands up for their own needs and wants, while also taking into consideration the needs and wants of others, without behaving in a passive or aggressive way.  Traits of assertive communicators were highlighted such as using appropriate speaking volume, maintaining eye contact, using confident language, and avoiding interruption.  Members were also provided with tips on how to improve communication, including respecting oneself, expressing thoughts and feelings calmly, and saying "No" when necessary.  Members were given a variety of scenarios where they could practice using these tips to respond in an assertive manner.  Intervention was effective, as evidenced by Caitlyn Malta participating in discussion on topic, reporting that she has an assertive communication style due to traits such as standing up for her own rights, having a confident tone and body language, good eye contact, and being willing to compromise in disagreements.  Caitlyn Harris reported that recently she has had problems with outburst, particularly towards her mother, and sometimes she can resort to passivity when she is uncomfortable with another person's approach.   Caitlyn Harris showed more effective use of assertive communication skills through engagement in roleplay activities today.    Assessment and Plan: Counselor recommends that Caitlyn Harris remain in IOP treatment to better manage mental health symptoms, ensure stability and pursue completion of treatment plan goals. Counselor recommends adherence to crisis/safety plan, taking medications as prescribed, and following up with medical professionals if any issues arise.   Follow Up Instructions: Counselor will send Webex link for next  session. Caitlyn Harris was advised  to call back or seek an in-person evaluation if the symptoms worsen or if the condition fails to improve as anticipated.   Collaboration of Care:   Medication Management AEB Dr. Fatima Sanger or Ricky Ala, NP                                          Case Manager AEB Dellia Nims, CNA   Patient/Guardian was advised Release of Information must be obtained prior to any record release in order to collaborate their care with an outside provider. Patient/Guardian was advised if they have not already done so to contact the registration department to sign all necessary forms in order for [place] to release information regarding their care.   Consent: Patient/Guardian gives verbal consent for treatment and assignment of benefits for services provided during this visit. Patient/Guardian expressed understanding and agreed to proceed.  I provided 180 minutes of non-face-to-face time during this encounter.   Shade Flood, [place], LCAS 02/20/22

## 2022-02-20 NOTE — Progress Notes (Signed)
Virtual Visit via Video Note   I connected with Caitlyn Harris on 02/17/22 at  9:00 AM EDT by a video enabled telemedicine application and verified that I am speaking with the correct person using two identifiers.   At orientation to the IOP program, Case Manager discussed the limitations of evaluation and management by telemedicine and the availability of in person appointments. The patient expressed understanding and agreed to proceed with virtual visits throughout the duration of the program.   Location:  Patient: Patient Home Provider: Clinical Home Office   History of Present Illness: MDD   Observations/Objective: Check In: Case Manager checked in with all participants to review discharge dates, insurance authorizations, work-related documents and needs from the treatment team regarding medications. Caitlyn Harris stated needs and engaged in discussion.    Initial Therapeutic Activity: Counselor facilitated a check-in with Caitlyn Harris to assess for safety, sobriety and medication compliance.  Counselor also inquired about Caitlyn Harris's current emotional ratings, as well as any significant changes in thoughts, feelings or behavior since previous check in.  Caitlyn Harris presented for session on time and was alert, oriented x5, with no evidence or self-report of active SI/HI or A/V H.  Caitlyn Harris reported compliance with medication and denied use of alcohol or illicit substances.  Caitlyn Harris reported scores of 1/10 for depression and 0/10 for anxiety.  Caitlyn Harris denied any recent outbursts or panic attacks.  Caitlyn Harris reported that a recent success was using boundaries with her mother. Caitlyn Harris reported that her goal today is to go grocery shopping.       Second Therapeutic Activity: Counselor led discussion in how to use "no" as a complete sentence, using I-statements, and giving self kudos for progress. Pt's identified if it is hard to give self credit for progress. Cln discussed "best friend test" and giving  self as much "grace and space" as others are allowed. Cln provided examples of I-statements and discussed how they can be helpful in communicating with others. Cln and group discussed setting boundaries and using "no" as a complete sentence. Caitlyn Harris reports difficulty with giving self credit. She identified wanting to work more on this.       Third Therapeutic Activity: Clinician introduced topic of "Positive Psychology". Group watched "Positive Psychology" Ted-Talk. Patients discussed how their "lens" of life affects the way they feel and how they view reality. Group discussed 5 strategies to help change lens. Patients identified one strategy they would be willing to try to change their "lens" for at least 21 days to create a new habit. Caitlyn Harris identified writing 3 gratitudes to help change her lens. Cln assessed for safety at the end of group. Caitlyn Harris denies SI/HI.        Assessment and Plan: Counselor recommends that Caitlyn Harris remain in IOP treatment to better manage mental health symptoms, ensure stability and pursue completion of treatment plan goals. Counselor recommends adherence to crisis/safety plan, taking medications as prescribed, and following up with medical professionals if any issues arise.   Follow Up Instructions: Counselor will send Webex link for next session. Caitlyn Harris was advised to call back or seek an in-person evaluation if the symptoms worsen or if the condition fails to improve as anticipated.   Collaboration of Care:   Medication Management AEB Dr. Fatima Sanger or Ricky Ala, NP  Case Manager AEB Dellia Nims, CNA   Patient/Guardian was advised Release of Information must be obtained prior to any record release in order to collaborate their care with an outside provider. Patient/Guardian was advised if they have not already done so to contact the registration department to sign all necessary forms in order for [place] to release  information regarding their care.   Consent: Patient/Guardian gives verbal consent for treatment and assignment of benefits for services provided during this visit. Patient/Guardian expressed understanding and agreed to proceed.  I provided 180 minutes of non-face-to-face time during this encounter.  Loistine Chance, [facility] 02/17/22

## 2022-02-21 ENCOUNTER — Ambulatory Visit: Payer: BC Managed Care – PPO | Admitting: Neurology

## 2022-02-21 ENCOUNTER — Other Ambulatory Visit (HOSPITAL_COMMUNITY): Payer: BC Managed Care – PPO | Admitting: Licensed Clinical Social Worker

## 2022-02-21 DIAGNOSIS — F332 Major depressive disorder, recurrent severe without psychotic features: Secondary | ICD-10-CM | POA: Diagnosis not present

## 2022-02-21 NOTE — Progress Notes (Signed)
Virtual Visit via Video Note   I connected with Caitlyn Harris on 02/21/22 at  9:00 AM EDT by a video enabled telemedicine application and verified that I am speaking with the correct person using two identifiers.   At orientation to the IOP program, Case Manager discussed the limitations of evaluation and management by telemedicine and the availability of in person appointments. The patient expressed understanding and agreed to proceed with virtual visits throughout the duration of the program.   Location:  Patient: Patient Home Provider: Clinical Home Office   History of Present Illness: MDD   Observations/Objective: Check In: Case Manager checked in with all participants to review discharge dates, insurance authorizations, work-related documents and needs from the treatment team regarding medications. Caitlyn Harris stated needs and engaged in discussion.    Initial Therapeutic Activity: Counselor facilitated a check-in with Caitlyn Harris to assess for safety, sobriety and medication compliance.  Counselor also inquired about [place]'s current emotional ratings, as well as any significant changes in thoughts, feelings or behavior since previous check in.  Caitlyn Harris presented for session on time and was alert, oriented x5, with no evidence or self-report of active SI/HI or A/V H.  Caitlyn Harris reported compliance with medication and denied use of alcohol or illicit substances.  Caitlyn Harris reported scores of 1/10 for depression, 3/10 for anxiety, and 0/10 for anger/irritability.  Caitlyn Harris denied any recent outbursts or panic attacks.  Caitlyn Harris reported that a recent success was going out to dinner with friends, stating "That was really fun".  Caitlyn Harris denied any new challenges at this time.  Caitlyn Harris reported that her goal this weekend is to attend the '[place]ing of the tree' event in her hometown with family.         Second Therapeutic Activity: Counselor invited group members to practice a new meditation  exercise to add to self-care routine in order to better manage daily stress.  Counselor provided instruction on process of getting comfortable, achieving relaxing breathing rhythm, and visualizing a protective light moving across the muscle groups within their bodies to identify and eliminate areas of tension associated with stress over course of 15 minutes practice.  Counselor inquired about how members mentally and physically felt afterward, any challenges they faced in concentration, and whether they would be motivated to practice this in free time to improve coping ability.  Intervention was ineffective, as Caitlyn Harris reported that although she did try to participate in activity, she found it very difficult to focus, as her imagination distracted her from the practice and had her focusing on unrelated topics.  She reported that meditation and relaxation activities tend to be a challenge for her because of this issue.     Third Therapeutic Activity: Psycho-educational portion of group was provided by Caitlyn Harris, [employer] of community education with [employer].  Caitlyn Harris provided information on history of her local agency, mission statement, and the variety of unique services offered which group members might find beneficial to engage in, including both virtual and in-person support groups, as well as peer support program for mentoring.  Caitlyn Harris offered time to answer member's questions regarding services and encouraged them to consider utilizing these services to assist in working towards their individual wellness goals.  Intervention was effective, as evidenced by Caitlyn Malta participating in discussion with speaker on the subject, reporting that she would be interested in attending some of the virtual groups since she doesn't live in [place], but would still like to expand upon support network due to benefits gained from group therapy  during time in [place].    Assessment and Plan: Caitlyn Harris  has completed [facility] successfully and will be discharged from program.  Counselor recommends adherence to crisis/safety plan, taking medications as prescribed, and following up with medical professionals if any issues arise.   Follow Up Instructions: Caitlyn Harris was advised to call back or seek an in-person evaluation if the symptoms worsen or if the condition fails to improve as anticipated.   Collaboration of Care:   Medication Management AEB Dr. Fatima Sanger or Ricky Ala, NP                                          Case Manager AEB Dellia Nims, CNA   Patient/Guardian was advised Release of Information must be obtained prior to any record release in order to collaborate their care with an outside provider. Patient/Guardian was advised if they have not already done so to contact the registration department to sign all necessary forms in order for [place] to release information regarding their care.   Consent: Patient/Guardian gives verbal consent for treatment and assignment of benefits for services provided during this visit. Patient/Guardian expressed understanding and agreed to proceed.  I provided 180 minutes of non-face-to-face time during this encounter.   Shade Flood, [place], LCAS 02/21/22

## 2022-02-24 ENCOUNTER — Telehealth: Payer: Self-pay

## 2022-02-24 NOTE — Telephone Encounter (Signed)
Called pt to confirm appointment for tomorrow.  Left message for pt to call and confirm appointment by 1500 02/25/22

## 2022-02-26 ENCOUNTER — Ambulatory Visit (INDEPENDENT_AMBULATORY_CARE_PROVIDER_SITE_OTHER): Payer: BC Managed Care – PPO | Admitting: Family Medicine

## 2022-02-26 ENCOUNTER — Encounter: Payer: Self-pay | Admitting: Family Medicine

## 2022-02-26 VITALS — BP 134/86 | HR 103 | Ht 73.0 in | Wt 266.0 lb

## 2022-02-26 DIAGNOSIS — N393 Stress incontinence (female) (male): Secondary | ICD-10-CM

## 2022-02-26 DIAGNOSIS — N8111 Cystocele, midline: Secondary | ICD-10-CM | POA: Diagnosis not present

## 2022-02-26 DIAGNOSIS — N816 Rectocele: Secondary | ICD-10-CM

## 2022-02-26 NOTE — Assessment & Plan Note (Signed)
Refer to urogyn

## 2022-02-26 NOTE — Assessment & Plan Note (Signed)
Since this has been repaired in the past and with ongoing urinary sx's, will refer to [facility]

## 2022-02-26 NOTE — Progress Notes (Signed)
   Subjective:    Patient ID: Caitlyn Harris is a 41 y.o. female presenting with No chief complaint on file.  on 02/26/2022  HPI: H/o of SVD x 2, largest baby 9 lb 8 oz. Had pain and bleeding and diagnosed with endometriosis and adenomyosis and h/o LEEP and so underwent Laparoscopic hysterectomy in 2017. Also with h/o left ectopic and had left salpingectomy. Had a posterior repair and ovarian cystectomy with Dr. Kenton Kingfisher in 2019 and another laparoscopic ovarian cystectomy in 2021. Notes needing to push in vagina to have a BM.  Feels like there might be some bladder fullness and leaking urine with laughing, coughing, bending. Also has some pain around her bladder. Has had hernia repair at the umbilical area no mesh.  Review of Systems  Constitutional:  Negative for chills and fever.  Respiratory:  Negative for shortness of breath.   Cardiovascular:  Negative for chest pain.  Gastrointestinal:  Negative for abdominal pain, nausea and vomiting.  Genitourinary:  Negative for dysuria.  Skin:  Negative for rash.      Objective:    BP 134/86   Pulse (!) 103   Ht '6\' 1"'$  (1.854 m)   Wt 266 lb (120.7 kg)   BMI 35.09 kg/m  Physical Exam Exam conducted with a chaperone present.  Constitutional:      General: She is not in acute distress.    Appearance: She is well-developed.  HENT:     Head: Normocephalic and atraumatic.  Eyes:     General: No scleral icterus. Cardiovascular:     Rate and Rhythm: Normal rate.  Pulmonary:     Effort: Pulmonary effort is normal.  Abdominal:     Palpations: Abdomen is soft.  Genitourinary:    Comments: BUS normal, vagina is pink and rugated, cervix and uterus are surgically absent, no adnexal mass or tenderness, the posterior repair feels ok, there is loss of tissue higher in the posterior vagina c/w rectocele. Also, with cystocele anterior but further into the vaginal canal.  Musculoskeletal:     Cervical back: Neck supple.  Skin:    General:  Skin is warm and dry.  Neurological:     Mental Status: She is alert and oriented to person, place, and time.         Assessment & Plan:   Problem List Items Addressed This Visit       Unprioritized   Cystocele, midline    Refer to urogyn      Rectocele    Since this has been repaired in the past and with ongoing urinary sx's, will refer to [place]      Relevant Orders   Ambulatory referral to Urogynecology   Stress incontinence - Primary    To see UroGyn for more definitive therapy      Relevant Orders   Ambulatory referral to Urogynecology    Return if symptoms worsen or fail to improve.  Donnamae Jude, MD 02/26/2022 2:28 PM

## 2022-02-26 NOTE — Assessment & Plan Note (Signed)
To see UroGyn for more definitive therapy

## 2022-02-27 ENCOUNTER — Other Ambulatory Visit: Payer: Self-pay

## 2022-02-27 ENCOUNTER — Emergency Department
Admission: EM | Admit: 2022-02-27 | Discharge: 2022-02-27 | Disposition: A | Discharge status: Home / Self Care | Payer: BC Managed Care – PPO | Attending: Emergency Medicine | Admitting: Emergency Medicine

## 2022-02-27 DIAGNOSIS — Z20822 Contact with and (suspected) exposure to covid-19: Secondary | ICD-10-CM | POA: Diagnosis not present

## 2022-02-27 DIAGNOSIS — J029 Acute pharyngitis, unspecified: Secondary | ICD-10-CM | POA: Diagnosis present

## 2022-02-27 DIAGNOSIS — J020 Streptococcal pharyngitis: Secondary | ICD-10-CM | POA: Insufficient documentation

## 2022-02-27 LAB — GROUP A STREP BY PCR: Group A Strep by PCR: DETECTED — AB

## 2022-02-27 LAB — RESP PANEL BY RT-PCR (RSV, FLU A&B, COVID)  RVPGX2
Influenza A by PCR: NEGATIVE
Influenza B by PCR: NEGATIVE
Resp Syncytial Virus by PCR: NEGATIVE
SARS Coronavirus 2 by RT PCR: NEGATIVE

## 2022-02-27 MED ORDER — ACETAMINOPHEN 325 MG PO TABS
650.0000 mg | ORAL_TABLET | Freq: Once | ORAL | Status: AC
  Administered 2022-02-27: 650 mg via ORAL
  Filled 2022-02-27: qty 2

## 2022-02-27 MED ORDER — AMOXICILLIN-POT CLAVULANATE 875-125 MG PO TABS: 1.0000 | ORAL_TABLET | Freq: Two times a day (BID) | ORAL | 0 refills | Status: AC

## 2022-02-27 NOTE — ED Provider Notes (Signed)
[facility] Provider Note    Event Date/Time   First MD Initiated Contact with Patient 02/27/22 [phone]     (approximate)   History   Sore Throat   HPI  Caitlyn Harris is a 41 y.o. female with a past medical history of anxiety depression, obesity who presents today for evaluation of sore throat.  Patient reports that she was treated for strep throat approximately 2 to 3 weeks ago and felt fine until this morning she woke with a sore throat again.  She reports that this is not as bad as when she had strep throat but she wants to be sure that she has not gotten strep throat again.  She denies trouble swallowing, but reports that she has pain with swallowing.  She has not had any voice change, trouble opening her mouth, or difficulty handling her own secretions.  No fevers or chills.  Patient Active Problem List   Diagnosis Date Noted   Stress incontinence 02/26/2022   Adenomatous polyp of colon    Dyspepsia    Low back pain 12/19/2021   MDD (major depressive disorder), recurrent severe, without psychosis ([place]) 11/15/2021   Anxiety 08/21/2021   Attention and concentration deficit 08/21/2021   Cervical lymphadenopathy 07/09/2021   Heartburn 06/26/2021   Obesity (BMI 30-39.9) 04/16/2020   Hypertriglyceridemia 06/11/2018   Endometriosis 02/01/2018   Chronic female pelvic pain 02/01/2018   Rectocele 01/18/2018   Insomnia 08/25/2017   Cystocele, midline 08/25/2017   Chronic migraine without aura without status migrainosus, not intractable 02/04/2016   Depression 02/04/2016   GAD (generalized anxiety disorder) 02/04/2016   Vitamin D deficiency, unspecified 02/04/2016          Physical Exam   Triage Vital Signs: ED Triage Vitals [02/27/22 0752]  Enc Vitals Group     BP      Pulse      Resp      Temp      Temp src      SpO2      Weight      Height      Head Circumference      Peak Flow      Pain Score 7     Pain Loc      Pain Edu?       Excl. in [place]?     Most recent vital signs: There were no vitals filed for this visit.  Physical Exam Vitals and nursing note reviewed.  Constitutional:      General: Awake and alert. No acute distress.    Appearance: Normal appearance. The patient is obese.  HENT:     Head: Normocephalic and atraumatic.     Mouth: Mucous membranes are moist. Uvula midline.  Mild posterior oropharyngeal erythema.  No tonsillar exudate.  No soft palate fluctuance.  No trismus.  No voice change.  No sublingual swelling.  No tender cervical lymphadenopathy.  No nuchal rigidity Eyes:     General: PERRL. Normal EOMs        Right eye: No discharge.        Left eye: No discharge.     Conjunctiva/sclera: Conjunctivae normal.  Cardiovascular:     Rate and Rhythm: Normal rate and regular rhythm.     Pulses: Normal pulses.  Pulmonary:     Effort: Pulmonary effort is normal. No respiratory distress.     Breath sounds: Normal breath sounds.  Abdominal:     Abdomen is soft. There is no abdominal  tenderness. No rebound or guarding. No distention. Musculoskeletal:        General: No swelling. Normal range of motion.     Cervical back: Normal range of motion and neck supple.  Skin:    General: Skin is warm and dry.     Capillary Refill: Capillary refill takes less than 2 seconds.     Findings: No rash.  Neurological:     Mental Status: The patient is awake and alert.      ED Results / Procedures / Treatments   Labs (all labs ordered are listed, but only abnormal results are displayed) Labs Reviewed  GROUP A STREP BY PCR - Abnormal; Notable for the following components:      Result Value   Group A Strep by PCR DETECTED (*)    All other components within normal limits  RESP PANEL BY RT-PCR (RSV, FLU A&B, COVID)  [id number]     EKG     RADIOLOGY     PROCEDURES:  Critical Care performed:   Procedures   MEDICATIONS ORDERED IN ED: Medications  acetaminophen (TYLENOL) tablet 650 mg (650 mg  Oral Given 02/27/22 0813)     IMPRESSION / MDM / [place] / ED COURSE  I reviewed the triage vital signs and the nursing notes.   Differential diagnosis includes, but is not limited to, viral pharyngitis, strep pharyngitis, COVID, other URI.  Patient is awake and alert, hemodynamically stable and afebrile.  Vital signs which I obtained in the room given that these were not obtained in triage revealed BP of 132/83, HR 86, Temp 97.74F, RR 16. There is no uvular deviation, voice change, trismus, drooling, nuchal rigidity to suggest peritonsillar or retropharyngeal abscess.  She is quite nontoxic in appearance.  Strep test was positive for strep pharyngitis.  Patient does admit to reusing her toothbrush when she was positive for strep a couple of weeks ago.  I recommended that she change her toothbrush and wash all surfaces so that she does not keep reinfecting herself.  She was started on antibiotics.  We discussed return precautions and the importance of close outpatient follow-up.  She was discharged in stable condition.  Patient understands and agrees with plan.  Discharged in stable condition.   Patient's presentation is most consistent with acute illness / injury with system symptoms.   FINAL CLINICAL IMPRESSION(S) / ED DIAGNOSES   Final diagnoses:  Strep pharyngitis     Rx / DC Orders   ED Discharge Orders          Ordered    amoxicillin-clavulanate (AUGMENTIN) 875-125 MG tablet  2 times daily        02/27/22 0907             Note:  This document was prepared using Dragon voice recognition software and may include unintentional dictation errors.   Emeline Gins 02/27/22 1301    Vanessa Greenway, MD 02/27/22 1359

## 2022-02-27 NOTE — ED Triage Notes (Signed)
Pt states that she has a sore throat and would like to be checked for strep. Pt was recently treated for strep.

## 2022-02-27 NOTE — Discharge Instructions (Signed)
Take the antibiotics as prescribed.  Please change your toothbrush.  You may continue to take Tylenol/ibuprofen per package instructions to help with your symptoms.  Please return for any new, worsening, or change in symptoms or other concerns.  It was a pleasure caring for you today.

## 2022-03-02 ENCOUNTER — Other Ambulatory Visit: Payer: Self-pay | Admitting: Psychiatry

## 2022-03-02 DIAGNOSIS — F411 Generalized anxiety disorder: Secondary | ICD-10-CM

## 2022-03-02 DIAGNOSIS — F332 Major depressive disorder, recurrent severe without psychotic features: Secondary | ICD-10-CM

## 2022-03-03 ENCOUNTER — Ambulatory Visit (INDEPENDENT_AMBULATORY_CARE_PROVIDER_SITE_OTHER): Payer: BC Managed Care – PPO | Admitting: Licensed Clinical Social Worker

## 2022-03-03 DIAGNOSIS — R41840 Attention and concentration deficit: Secondary | ICD-10-CM

## 2022-03-03 DIAGNOSIS — F332 Major depressive disorder, recurrent severe without psychotic features: Secondary | ICD-10-CM

## 2022-03-03 DIAGNOSIS — F411 Generalized anxiety disorder: Secondary | ICD-10-CM

## 2022-03-03 DIAGNOSIS — Z634 Disappearance and death of family member: Secondary | ICD-10-CM

## 2022-03-03 NOTE — Plan of Care (Signed)
Pt initial treatment plan visit 03/03/2022.   Problem: Depression  Goal:  Depression  Description: Will work with pt to learn and implement calming skills to reduce overall depression and manage depression symptoms. Some of the techniques that will be used during session will be deep breathing exercises, visual imagery, progressive muscle relaxation, postreduction relative to the benefits of self-care and other breathing exercises.  Outcome: Not Progressing Goal: STG: [place] "Jenni" WILL PARTICIPATE IN AT LEAST 80% OF SCHEDULED INDIVIDUAL PSYCHOTHERAPY SESSIONS Description: Reduce overall frequency, intensity and duration of depression so that daily functioning is not impaired per pt self report 3 out of 5 sessions documented.   Outcome: Not Progressing   Problem: Anxiety  Goal: Anxiety  Description: Learn and Implement coping skills that result in the reduction of anxiety and worry and improve daily functioning per pt self report 3 out of 5 documented sessions.   Outcome: Not Progressing Goal: STG: Patient will participate in at least 80% of scheduled individual psychotherapy sessions Outcome: Not Progressing   Problem: Trauma/Grief  Goal:  Trauma  Description: Pt will explore coping skills and learn coping and grounding skills to reduce symptoms of PTSD and prepare to handle future stressful situations as evidenced by implementing coping skills per pt report 3 out of 5 documented sessions.   Outcome: Not Progressing   Problem: Anger Management  Goal:  Anger  Description: Learn and implement coping skills that will result in a reduction of anger and improve daily functioning per pt self report 3 out of 5 sessions documented.   Outcome: Not Progressing
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
## 2022-03-06 ENCOUNTER — Ambulatory Visit: Payer: BC Managed Care – PPO | Admitting: Gastroenterology

## 2022-03-10 NOTE — Progress Notes (Signed)
Patient is diagnosed with major depressive disorder, recurrent, severe without psychotic features.  Patient meets criteria for intensive outpatient program

## 2022-03-17 ENCOUNTER — Other Ambulatory Visit: Payer: Self-pay | Admitting: Psychiatry

## 2022-03-17 DIAGNOSIS — F332 Major depressive disorder, recurrent severe without psychotic features: Secondary | ICD-10-CM

## 2022-03-17 DIAGNOSIS — F411 Generalized anxiety disorder: Secondary | ICD-10-CM

## 2022-03-20 ENCOUNTER — Ambulatory Visit: Payer: BC Managed Care – PPO | Admitting: Gastroenterology

## 2022-03-20 ENCOUNTER — Ambulatory Visit: Payer: BC Managed Care – PPO | Admitting: Licensed Clinical Social Worker

## 2022-03-20 NOTE — Progress Notes (Deleted)
Jonathon Bellows MD, MRCP(U.K) [address]  [place]  [place], [place] [address]  Main: [phone]  Fax: [phone]   Primary Care Physician: Tower, Wynelle Fanny, MD  Primary Gastroenterologist:  Dr. Jonathon Bellows   No chief complaint on file.   HPI: Caitlyn Harris is a 41 y.o. female   Summary of history :  Initially referred and seen in October 2023 for diarrhea  Seen in the ER on 11/24/2021 for abdominal pain.  Mention in the ER that she had blood on the tissue paper   07/04/2021 right upper quadrant ultrasound shows spleen measures 13 cm thin splenomegaly 11/24/2021 CT abdomen hepatic steatosis spleen, is a normal size 11/24/2021 hemoglobin 13.2 g, CMP normal.     She says that she has had GI symptoms ongoing for many months if not longer.  Complains of nonspecific abdominal discomfort associated with consumption of certain foods ranging from labs to daily.  Describes her symptoms of cramping in the left side of her abdomen describes it as a knot sometimes radiating to the right side of her abdomen.  Sometimes relieved with a bowel movement sometimes not relieved with a bowel movement.  Previously had 1-2 bowel movements a day watery occasional rectal bleeding since 1 week having 6-7 bowel movements a day pronounced rectal bleeding bright red in color.  Denies any regular NSAID use.  She has been having reflux of late with symptoms predominantly of heartburn waking her up at night.  Has gained weight recently.  She has been taking omeprazole over-the-counter as needed with her meals    Interval history 01/13/2022-03/20/2022  01/13/2022: Stool test negative for infection fecal calprotectin normal celiac serologies negative H. pylori breath test negative.  01/16/2022 EGD: Normal .colonoscopy performed on the same day showed normal terminal ileum 5 mm polyp in the rectum resected otherwise the colonoscopy appeared normal.  Biopsies of stomach showed no abnormalities.  Biopsies of  the terminal ileum and random colon biopsies were normal no evidence of colitis.    ***   Current Outpatient Medications  Medication Sig Dispense Refill   acetaminophen (TYLENOL) 500 MG tablet Take 1,000 mg by mouth every 6 (six) hours as needed for moderate pain or headache.     botulinum toxin Type A (BOTOX) 200 units injection Inject 155 units IM into multiple site in the face,neck and head once every 90 days 1 each 4   clonazePAM (KLONOPIN) 0.5 MG tablet Take 0.5-1 tablets (0.25-0.5 mg total) by mouth daily as needed for anxiety. Please limit use (Patient not taking: Reported on 02/12/2022) 10 tablet 0   hydrOXYzine (VISTARIL) 25 MG capsule TAKE 1-2 CAPSULES (25-50 MG TOTAL) BY MOUTH AT BEDTIME AS NEEDED. FOR SLEEP AND ANXIETY 180 capsule 0   ibuprofen (ADVIL) 400 MG tablet Take 400 mg by mouth every 6 (six) hours as needed.     omeprazole (PRILOSEC) 20 MG capsule Take 1 capsule (20 mg total) by mouth 2 (two) times daily before a meal. 90 capsule 2   ondansetron (ZOFRAN-ODT) 8 MG disintegrating tablet Take 1 tablet (8 mg total) by mouth every 8 (eight) hours as needed for nausea or vomiting. 20 tablet 0   sertraline (ZOLOFT) 50 MG tablet TAKE 1 TABLET BY MOUTH EVERY DAY 90 tablet 0   SUMAtriptan (IMITREX) 100 MG tablet MAY REPEAT IN 2 HOURS IF HEADACHE PERSISTS OR RECURS. 30 tablet 3   tiZANidine (ZANAFLEX) 4 MG capsule TAKE 1 CAPSULE BY MOUTH 3 TIMES DAILY AS NEEDED  FOR MUSCLE SPASMS. 30 capsule 3   topiramate (TOPAMAX) 100 MG tablet TAKE 1 TABLET BY MOUTH TWICE A DAY 180 tablet 3   No current facility-administered medications for this visit.    Allergies as of 03/20/2022 - Review Complete 02/27/2022  Allergen Reaction Noted   Tape  08/24/2019    ROS:  General: Negative for anorexia, weight loss, fever, chills, fatigue, weakness. ENT: Negative for hoarseness, difficulty swallowing , nasal congestion. CV: Negative for chest pain, angina, palpitations, dyspnea on exertion,  peripheral edema.  Respiratory: Negative for dyspnea at rest, dyspnea on exertion, cough, sputum, wheezing.  GI: See history of present illness. GU:  Negative for dysuria, hematuria, urinary incontinence, urinary frequency, nocturnal urination.  Endo: Negative for unusual weight change.    Physical Examination:   There were no vitals taken for this visit.  General: Well-nourished, well-developed in no acute distress.  Eyes: No icterus. Conjunctivae pink. Mouth: Oropharyngeal mucosa moist and pink , no lesions erythema or exudate. Lungs: Clear to auscultation bilaterally. Non-labored. Heart: Regular rate and rhythm, no murmurs rubs or gallops.  Abdomen: Bowel sounds are normal, nontender, nondistended, no hepatosplenomegaly or masses, no abdominal bruits or hernia , no rebound or guarding.   Extremities: No lower extremity edema. No clubbing or deformities. Neuro: Alert and oriented x 3.  Grossly intact. Skin: Warm and dry, no jaundice.   Psych: Alert and cooperative, normal mood and affect.   Imaging Studies: No results found.  Assessment and Plan:   Caitlyn Harris is a 41 y.o. y/o female here to follow-up for heartburn ongoing for a year recent weight gain suggestive of reflux.  Dyspeptic symptoms, food intolerances some features suggestive of IBS diarrhea rec, rectal bleeding and worsening of her diarrhea since 1 week.  Differentials for this include IBS diarrhea versus inflammatory bowel disease.     Plan 1.  High-fiber diet 2.   No NSAIDs 5.  Trial of PPI 40 mg a day 6 weeks 6.  GERD patient information provided counseled about lifestyle changes advised use a wedge pillow 7.  At next visit if symptoms persist will get alpha gal testing and food allergy profile.    Dr Jonathon Bellows  MD,MRCP [facility]) Follow up in ***

## 2022-03-22 ENCOUNTER — Other Ambulatory Visit: Payer: Self-pay | Admitting: Gastroenterology

## 2022-04-10 ENCOUNTER — Encounter: Payer: Self-pay | Admitting: Gastroenterology

## 2022-04-10 ENCOUNTER — Ambulatory Visit: Payer: BC Managed Care – PPO | Admitting: Psychiatry

## 2022-04-30 ENCOUNTER — Other Ambulatory Visit (HOSPITAL_COMMUNITY): Payer: Self-pay

## 2022-05-06 ENCOUNTER — Other Ambulatory Visit: Payer: Self-pay

## 2022-05-07 ENCOUNTER — Telehealth: Payer: Self-pay

## 2022-05-09 ENCOUNTER — Other Ambulatory Visit (HOSPITAL_COMMUNITY): Payer: Self-pay

## 2022-05-09 ENCOUNTER — Telehealth: Payer: Self-pay | Admitting: Pharmacy Technician

## 2022-05-09 NOTE — Telephone Encounter (Signed)
[employer] verification has been submitted. Benefit Verification:   [id number]  Pharmacy PA approved (telephone encounter 9.8.23) PA# [phone] Effective dates: 12/12/2021 through 12/13/2022   Patient has been enrolled in [employer] Program.

## 2022-05-15 ENCOUNTER — Ambulatory Visit: Payer: BC Managed Care – PPO | Admitting: Nurse Practitioner

## 2022-05-16 ENCOUNTER — Ambulatory Visit: Payer: BC Managed Care – PPO | Admitting: Family Medicine

## 2022-05-16 ENCOUNTER — Ambulatory Visit: Payer: BC Managed Care – PPO | Admitting: Neurology

## 2022-05-16 DIAGNOSIS — G43709 Chronic migraine without aura, not intractable, without status migrainosus: Secondary | ICD-10-CM

## 2022-05-16 MED ORDER — RIZATRIPTAN BENZOATE 10 MG PO TABS: 10.0000 mg | ORAL_TABLET | ORAL | 5 refills | Status: DC | PRN

## 2022-05-16 MED ORDER — ONABOTULINUMTOXINA 100 UNITS IJ SOLR
200.0000 [IU] | Freq: Once | INTRAMUSCULAR | Status: AC
  Administered 2022-05-16: 155 [IU] via INTRAMUSCULAR

## 2022-05-16 NOTE — Progress Notes (Unsigned)
[facility]  ° °Procedure Note Botox ° °Attending: Dr. Katharine Rochefort ° °Preoperative Diagnosis(es): Chronic migraine ° °Consent obtained from: The patient °Benefits discussed included, but were not limited to decreased muscle tightness, increased joint range of motion, and decreased pain.  Risk discussed included, but were not limited pain and discomfort, bleeding, bruising, excessive weakness, venous thrombosis, muscle atrophy and dysphagia.  Anticipated outcomes of the procedure as well as he risks and benefits of the alternatives to the procedure, and the roles and tasks of the personnel to be involved, were discussed with the patient, and the patient consents to the procedure and agrees to proceed. A copy of the patient medication guide was given to the patient which explains the blackbox warning. ° °Patients identity and treatment sites confirmed Yes.  . ° °Details of Procedure: °Skin was cleaned with alcohol. Prior to injection, the needle plunger was aspirated to make sure the needle was not within a blood vessel.  There was no blood retrieved on aspiration.   ° °Following is a summary of the muscles injected  And the amount of Botulinum toxin used: ° °Dilution °200 units of Botox was reconstituted with 4 ml of preservative free normal saline. °Time of reconstitution: At the time of the office visit (<30 minutes prior to injection)  ° °Injections  °155 total units of Botox was injected with a 30 gauge needle. ° °Injection Sites: °L occipitalis: 15 units- 3 sites  °R occiptalis: 15 units- 3 sites ° °L upper trapezius: 15 units- 3 sites °R upper trapezius: 15 units- 3 sits          °L paraspinal: 10 units- 2 sites °R paraspinal: 10 units- 2 sites ° °Face °L frontalis(2 injection sites):10 units   °R frontalis(2 injection sites):10 units         °L corrugator: 5 units   °R corrugator: 5 units           °Procerus: 5 units   °L temporalis: 20 units °R temporalis: 20 units  ° °Agent:  °200 units of botulinum Type  A (Onobotulinum Toxin type A) was reconstituted with 4 ml of preservative free normal saline.  °Time of reconstitution: At the time of the office visit (<30 minutes prior to injection)  ° ° ° Total injected (Units):  155 ° Total wasted (Units):  45 ° °Patient tolerated procedure well without complications.   °Reinjection is anticipated in 3 months. ° ° °

## 2022-05-16 NOTE — Telephone Encounter (Signed)
Sumatriptan may still work but sometimes doesn't work.  Will prescribe rizatriptan to try instead of sumatriptan.  Will see if more effective.  If not more effective and/or still has occasional breakthrough headaches, will try Zavzpret or Ubrelvy as second line.  Already tried and failed Nurtec

## 2022-05-19 ENCOUNTER — Encounter: Payer: Self-pay | Admitting: Gastroenterology

## 2022-05-19 ENCOUNTER — Ambulatory Visit: Payer: BC Managed Care – PPO | Admitting: Gastroenterology

## 2022-05-19 VITALS — BP 120/84 | HR 88 | Temp 98.0°F | Ht 73.0 in | Wt 257.4 lb

## 2022-05-19 DIAGNOSIS — K580 Irritable bowel syndrome with diarrhea: Secondary | ICD-10-CM | POA: Diagnosis not present

## 2022-05-19 DIAGNOSIS — R1013 Epigastric pain: Secondary | ICD-10-CM

## 2022-05-19 NOTE — Progress Notes (Signed)
Jonathon Bellows MD, MRCP(U.K) [address]  [place]  [place], [place] [address]  Main: [phone]  Fax: [phone]   Primary Care Physician: [facility], Wynelle Fanny, MD  Primary Gastroenterologist:  Dr. Jonathon Bellows   Chief Complaint  Patient presents with   Follow-up    HPI: Caitlyn Harris is a 42 y.o. female   Summary of history :  She was initially referred and seen back in October 2023 for abdominal pain after an ER visit and blood on the tissue paper. 07/04/2021 right upper quadrant ultrasound shows spleen measures 13 cm thin splenomegaly 11/24/2021 CT abdomen hepatic steatosis spleen, is a normal size 11/24/2021 hemoglobin 13.2 g, CMP normal.     She says that she has had GI symptoms ongoing for many months if not longer.  Complains of nonspecific abdominal discomfort associated with consumption of certain foods. Describes her symptoms of cramping in the left side of her abdomen describes it as a knot sometimes radiating to the right side of her abdomen.  Sometimes relieved with a bowel movement sometimes not relieved with a bowel movement.  Previously had 1-2 bowel movements a day watery occasional rectal bleeding, a week prior to her office visit she had been having 6-7 bowel movements a day pronounced rectal bleeding bright red in color.  Denies any regular NSAID use.  She has been having reflux of late with symptoms predominantly of heartburn waking her up at night.  Has gained weight recently.  She has been taking omeprazole over-the-counter as needed with her meals   Interval history 01/13/2022-05/19/2022  01/13/2022: Stool studies for GI PCR fecal calprotectin C. difficile were negative.  Celiac serology H. pylori breath test were negative.   01/16/2022: EGD: Normal appearance, colonoscopy: Terminal ileum appeared normal 5 mm polyp in the rectum biopsies were taken throughout the colon.  Pathology showed no abnormalities of the stomach terminal ileum or random colon  biopsies.  She still has occasional diarrhea when she eats certain foods like popcorn beans and few others.  Current Outpatient Medications  Medication Sig Dispense Refill   acetaminophen (TYLENOL) 500 MG tablet Take 1,000 mg by mouth every 6 (six) hours as needed for moderate pain or headache.     botulinum toxin Type A (BOTOX) 200 units injection Inject 155 units IM into multiple site in the face,neck and head once every 90 days 1 each 4   hydrOXYzine (VISTARIL) 25 MG capsule TAKE 1-2 CAPSULES (25-50 MG TOTAL) BY MOUTH AT BEDTIME AS NEEDED. FOR SLEEP AND ANXIETY 180 capsule 0   ibuprofen (ADVIL) 400 MG tablet Take 400 mg by mouth every 6 (six) hours as needed.     omeprazole (PRILOSEC) 20 MG capsule TAKE 1 CAPSULE (20 MG TOTAL) BY MOUTH 2 (TWO) TIMES DAILY BEFORE A MEAL. 180 capsule 3   ondansetron (ZOFRAN-ODT) 8 MG disintegrating tablet Take 1 tablet (8 mg total) by mouth every 8 (eight) hours as needed for nausea or vomiting. 20 tablet 0   rizatriptan (MAXALT) 10 MG tablet Take 1 tablet (10 mg total) by mouth as needed for migraine. May repeat after 2 hours if needed.  Max 2 tablets in 24 hours. 10 tablet 5   sertraline (ZOLOFT) 50 MG tablet TAKE 1 TABLET BY MOUTH EVERY DAY 90 tablet 0   SUMAtriptan (IMITREX) 100 MG tablet MAY REPEAT IN 2 HOURS IF HEADACHE PERSISTS OR RECURS. 30 tablet 3   tiZANidine (ZANAFLEX) 4 MG capsule TAKE 1 CAPSULE BY MOUTH 3 TIMES DAILY  AS NEEDED FOR MUSCLE SPASMS. 30 capsule 3   topiramate (TOPAMAX) 100 MG tablet TAKE 1 TABLET BY MOUTH TWICE A DAY 180 tablet 3   clonazePAM (KLONOPIN) 0.5 MG tablet Take 0.5-1 tablets (0.25-0.5 mg total) by mouth daily as needed for anxiety. Please limit use (Patient not taking: Reported on 02/12/2022) 10 tablet 0   No current facility-administered medications for this visit.    Allergies as of 05/19/2022 - Review Complete 05/19/2022  Allergen Reaction Noted   Tape  08/24/2019    ROS:  General: Negative for anorexia, weight  loss, fever, chills, fatigue, weakness. ENT: Negative for hoarseness, difficulty swallowing , nasal congestion. CV: Negative for chest pain, angina, palpitations, dyspnea on exertion, peripheral edema.  Respiratory: Negative for dyspnea at rest, dyspnea on exertion, cough, sputum, wheezing.  GI: See history of present illness. GU:  Negative for dysuria, hematuria, urinary incontinence, urinary frequency, nocturnal urination.  Endo: Negative for unusual weight change.    Physical Examination:   BP 120/84   Pulse 88   Temp 98 F (36.7 C) (Oral)   Ht 6' 1"$  (1.854 m)   Wt 257 lb 6 oz (116.7 kg)   BMI 33.96 kg/m   General: Well-nourished, well-developed in no acute distress.  Eyes: No icterus. Conjunctivae pink. Mouth: Oropharyngeal mucosa moist and pink , no lesions erythema or exudate. Neuro: Alert and oriented x 3.  Grossly intact. Skin: Warm and dry, no jaundice.   Psych: Alert and cooperative, normal mood and affect.   Imaging Studies: No results found.  Assessment and Plan:   Caitlyn Harris is a 41 y.o. y/o female here to follow-up to her last visit when she presented with features suggestive of IBS diarrhea, dyspepsia.  EGD colonoscopy stool studies celiac serology H. pylori breath test fecal calprotectin have been negative    Plan 1.  Low FODMAP diet 2.  No NSAIDs 3.  Alpha gal panel and food allergy panel 4.  Trial of Ibgard 5.  If no better advised her to inform me and we will get a course of Xifaxan for IBS diarrhea  Dr Jonathon Bellows  MD,MRCP [facility]) Follow up in as needed

## 2022-05-22 LAB — FOOD ALLERGY PROFILE
Allergen Corn, IgE: <0.1 kU/L
Clam IgE: <0.1 kU/L
Codfish IgE: <0.1 kU/L
Egg White IgE: <0.1 kU/L
Milk IgE: <0.1 kU/L
Peanut IgE: <0.1 kU/L
Scallop IgE: <0.1 kU/L
Sesame Seed IgE: <0.1 kU/L
Shrimp IgE: <0.1 kU/L
Soybean IgE: <0.1 kU/L
Walnut IgE: <0.1 kU/L
Wheat IgE: <0.1 kU/L

## 2022-05-22 LAB — ALPHA-GAL PANEL
Allergen Lamb IgE: <0.1 kU/L
Beef IgE: <0.1 kU/L
IgE (Immunoglobulin E), Serum: 3 IU/mL — ABNORMAL LOW (ref 6–495)
O215-IgE Alpha-Gal: <0.1 kU/L
Pork IgE: <0.1 kU/L

## 2022-05-22 NOTE — Progress Notes (Signed)
Negative.

## 2022-06-18 ENCOUNTER — Other Ambulatory Visit: Payer: Self-pay | Admitting: Psychiatry

## 2022-06-18 DIAGNOSIS — F332 Major depressive disorder, recurrent severe without psychotic features: Secondary | ICD-10-CM

## 2022-06-18 DIAGNOSIS — F411 Generalized anxiety disorder: Secondary | ICD-10-CM
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
## 2022-06-30 ENCOUNTER — Encounter: Payer: Self-pay | Admitting: Neurology

## 2022-06-30 ENCOUNTER — Ambulatory Visit: Payer: BC Managed Care – PPO | Admitting: Neurology

## 2022-06-30 VITALS — BP 112/78 | HR 95 | Resp 20 | Ht 73.0 in | Wt 255.0 lb

## 2022-06-30 DIAGNOSIS — G44229 Chronic tension-type headache, not intractable: Secondary | ICD-10-CM

## 2022-06-30 DIAGNOSIS — G43109 Migraine with aura, not intractable, without status migrainosus: Secondary | ICD-10-CM | POA: Diagnosis not present

## 2022-06-30 MED ORDER — QULIPTA 60 MG PO TABS: 60.0000 mg | ORAL_TABLET | Freq: Every day | ORAL | 11 refills | Status: DC

## 2022-06-30 NOTE — Patient Instructions (Signed)
Stop Botox.  Start Qulipta 60mg  daily At earliest onset of migraine, take Ubrelvy 100mg .  May repeat after 2 hours.  Maximum 2 in 24 hours.  Let me know if it works Limit use of pain relievers to no more than 2 days out of week to prevent risk of rebound or medication-overuse headache. Keep headache diary Follow up 5 months.

## 2022-07-09 ENCOUNTER — Other Ambulatory Visit: Payer: Self-pay | Admitting: Psychiatry

## 2022-07-09 DIAGNOSIS — F332 Major depressive disorder, recurrent severe without psychotic features: Secondary | ICD-10-CM

## 2022-07-09 DIAGNOSIS — F411 Generalized anxiety disorder: Secondary | ICD-10-CM

## 2022-07-10 ENCOUNTER — Encounter: Payer: Self-pay | Admitting: Neurology

## 2022-07-21 ENCOUNTER — Telehealth: Payer: Self-pay | Admitting: Family Medicine

## 2022-07-21 DIAGNOSIS — F332 Major depressive disorder, recurrent severe without psychotic features: Secondary | ICD-10-CM

## 2022-07-21 DIAGNOSIS — F411 Generalized anxiety disorder: Secondary | ICD-10-CM

## 2022-07-21 MED ORDER — SERTRALINE HCL 50 MG PO TABS: 50.0000 mg | ORAL_TABLET | Freq: Every day | ORAL | 0 refills | Status: DC

## 2022-07-21 NOTE — Telephone Encounter (Signed)
Pt called asking if [facility] could refill her meds, sertraline (ZOLOFT) 50 MG tablet [[id number]].  Pt states the meds are usually prescribed by psychiatrist, Dr. Elna Breslow. Pt states it's difficult scheduling a ov with the psychiatrist due to the psychiatrist working mornings only & she only being off in the afternoon for work. Pt states at the moment, the psychiatrist is off for vacation & she is out of meds. Call back # [phone]

## 2022-07-21 NOTE — Addendum Note (Signed)
Addended by: Roxy Manns A on: 07/21/2022 08:47 PM   Modules accepted: Orders

## 2022-07-21 NOTE — Telephone Encounter (Signed)
I sent 15 pills to get her by until psychiatrist gets back in town   Will cc Dr Elna Breslow

## 2022-07-22 ENCOUNTER — Other Ambulatory Visit: Payer: Self-pay | Admitting: Neurology

## 2022-07-22 MED ORDER — UBRELVY 100 MG PO TABS: 1.0000 | ORAL_TABLET | ORAL | 11 refills | Status: DC | PRN

## 2022-07-22 MED ORDER — AJOVY 225 MG/1.5ML ~~LOC~~ SOAJ: 225.0000 mg | SUBCUTANEOUS | 11 refills | Status: DC

## 2022-07-22 NOTE — Telephone Encounter (Signed)
I am not out on vacation. Not sure where patient got this information from. Last appointment was in November and was advised to return in 6 weeks , but still do not see an appointment scheduled. Will have staff reach out to schedule an appointment.

## 2022-07-22 NOTE — Telephone Encounter (Signed)
Pt notified of Dr. Royden Purl comments and she said she will keep trying to reach Dr. Elvera Maria office to try and get an appt scheduled

## 2022-07-23 ENCOUNTER — Other Ambulatory Visit
Admission: RE | Admit: 2022-07-23 | Discharge: 2022-07-23 | Disposition: A | Discharge status: Home / Self Care | Payer: BC Managed Care – PPO | Source: Ambulatory Visit | Attending: Psychiatry | Admitting: Psychiatry

## 2022-07-23 ENCOUNTER — Ambulatory Visit: Payer: BC Managed Care – PPO | Admitting: Psychiatry

## 2022-07-23 ENCOUNTER — Ambulatory Visit
Admission: RE | Admit: 2022-07-23 | Discharge: 2022-07-23 | Disposition: A | Discharge status: Home / Self Care | Payer: BC Managed Care – PPO | Source: Ambulatory Visit | Attending: Psychiatry | Admitting: Psychiatry

## 2022-07-23 ENCOUNTER — Encounter: Payer: Self-pay | Admitting: Psychiatry

## 2022-07-23 VITALS — BP 125/80 | HR 82 | Temp 97.3°F | Ht 73.0 in | Wt 254.6 lb

## 2022-07-23 DIAGNOSIS — F902 Attention-deficit hyperactivity disorder, combined type: Secondary | ICD-10-CM

## 2022-07-23 DIAGNOSIS — Z79899 Other long term (current) drug therapy: Secondary | ICD-10-CM | POA: Insufficient documentation

## 2022-07-23 DIAGNOSIS — F411 Generalized anxiety disorder: Secondary | ICD-10-CM | POA: Insufficient documentation

## 2022-07-23 DIAGNOSIS — F332 Major depressive disorder, recurrent severe without psychotic features: Secondary | ICD-10-CM | POA: Insufficient documentation

## 2022-07-23 DIAGNOSIS — Z5181 Encounter for therapeutic drug level monitoring: Secondary | ICD-10-CM | POA: Diagnosis not present

## 2022-07-23 DIAGNOSIS — I498 Other specified cardiac arrhythmias: Secondary | ICD-10-CM | POA: Diagnosis not present

## 2022-07-23 DIAGNOSIS — F3341 Major depressive disorder, recurrent, in partial remission: Secondary | ICD-10-CM

## 2022-07-23 MED ORDER — SERTRALINE HCL 50 MG PO TABS: 75.0000 mg | ORAL_TABLET | Freq: Every day | ORAL | 0 refills | Status: DC

## 2022-07-23 NOTE — Patient Instructions (Addendum)
Please call for EKG - [phone]  [email or web address]  [email or web address]  [employer] [email or web address] [address] [place], [place] [address]   [phone]  [facility], [facility] [email or web address] [address], [place], [place] [address]   [phone]   [facility] - [phone]  [facility] - [phone]  [facility] - [phone] counseling [phone]  [facility] [phone]   [email or web address] this clinician can offer telehealth and has a sliding scale option  [email or web address] this group also offers sliding scale rates and is based out of [place]  Dr. Liborio Nixon with the [facility] specializes in divorce  [employer] [facility] has interns who offer sliding scale rates and some of the full time clinicians do, as well. You complete their contact form on their website and the referrals coordinator will help to get connected to someone    Lisdexamfetamine Capsule What is this medication? LISDEXAMFETAMINE (lis DEX am fet a meen) treats attention-deficit hyperactivity disorder (ADHD). It may also be used to treat binge eating disorder. It works by reducing hyperactivity and impulsive behaviors. It belongs to a group of medications called stimulants. This medicine may be used for other purposes; ask your health care provider or pharmacist if you have questions. COMMON BRAND NAME(S): Vyvanse What should I tell my care team before I take this medication? They need to know if you have any of these conditions: Anxiety or panic attacks Circulation problems in fingers and toes Glaucoma Hardening or blockages of the arteries or heart blood vessels Heart disease or a heart defect High blood pressure History of stroke Kidney disease Liver disease Mental  health condition Seizures Substance use disorder Suicidal thoughts, plans, or attempt by you or a family member Thyroid disease Tourette's syndrome An unusual or allergic reaction to lisdexamfetamine, other medications, foods, dyes, or preservatives Pregnant or trying to get pregnant Breast-feeding How should I use this medication? Take this medication by mouth. Follow the directions on the prescription label. Swallow the capsules with a drink of water. You may open capsule and add to a glass of water, then drink right away. Take your doses at regular intervals. Do not take your medication more often than directed. Do not suddenly stop your medication. You must gradually reduce the dose, or you may feel withdrawal effects. Ask your care team for advice. A special MedGuide will be given to you by the pharmacist with each prescription and refill. Be sure to read this information carefully each time. Talk to your care team about the use of this medication in children. While this medication may be prescribed for children as young as 40 years of age for selected conditions, precautions do apply. Overdosage: If you think you have taken too much of this medicine contact a poison control center or emergency room at once. NOTE: This medicine is only for you. Do not share this medicine with others. What if I miss a dose? If you miss a dose, take it as soon as you can. If it is almost time for your next dose, take only that dose. Do not take double or extra doses. What may interact with this medication? Do not take this medication with any of the following: MAOIs, such as Carbex, Eldepryl, Marplan, Nardil, and Parnate Other stimulant medications for attention disorders, weight loss, or to stay awake This medication may also interact with the following: Acetazolamide Ammonium chloride Antacids  Ascorbic acid Atomoxetine Caffeine Certain medications for blood pressure Certain medications for mental  health conditions Certain medications for seizures, such as carbamazepine, phenobarbital, phenytoin Certain medications for stomach problems, such as cimetidine, famotidine, omeprazole, lansoprazole Cold or allergy medications Green tea Levodopa Linezolid Medications for sleep during surgery Methenamine Norepinephrine Phenothiazines, such as chlorpromazine, mesoridazine, prochlorperazine, thioridazine Propoxyphene Sodium acid phosphate Sodium bicarbonate This list may not describe all possible interactions. Give your health care provider a list of all the medicines, herbs, non-prescription drugs, or dietary supplements you use. Also tell them if you smoke, drink alcohol, or use illegal drugs. Some items may interact with your medicine. What should I watch for while using this medication? Visit your care team for regular check-ups. This prescription requires that you follow special procedures with your care team and pharmacy. You will need to have a new written prescription from your care team every time you need a refill. This medication has a risk of abuse and dependence. Your care team will check you for this while you take this medication. Long term use of this medication may cause your brain and body to depend on it. This does not usually happen if you take breaks from this medication during weekends, holidays, or summer vacations. Talk to your care team about what works for you. If your care team wants you to stop this medication permanently, the dose may be slowly lowered over time to reduce the risk of side effects. This medication may affect your concentration, or hide signs of tiredness. Until you know how this medication affects you, do not drive, ride a bicycle, use machinery, or do anything that needs mental alertness. Tell your care team if this medication loses its effects, or if you feel you need to take more than the prescribed amount. Do not change your dose without talking to  your care team. Decreased appetite is a common side effect when starting this medication. Eating small, frequent meals or snacks can help. Talk to your care team if you continue to have poor eating habits. Height and weight growth of a child taking this medication will be monitored closely. Do not take this medication close to bedtime. It may prevent you from sleeping. If you are going to need surgery, a MRI, CT scan, or other procedure, tell your care team that you are taking this medication. You may need to stop taking this medication before the procedure. Tell your care team right away if you notice unexplained wounds on your fingers and toes while taking this medication. You should also tell your care team if you experience numbness or pain, changes in the skin color, or sensitivity to temperature in your fingers or toes. What side effects may I notice from receiving this medication? Side effects that you should report to your care team as soon as possible: Allergic reactions--skin rash, itching, hives, swelling of the face, lips, tongue, or throat Heart rhythm changes--fast or irregular heartbeat, dizziness, feeling faint or lightheaded, chest pain, trouble breathing Increase in blood pressure Mood and behavior changes--anxiety, nervousness, confusion, hallucinations, irritability, hostility, thoughts of suicide or self-harm, worsening mood, feelings of depression Painful or prolonged erection Raynaud's--cool, numb, or painful fingers or toes that may change color from pale, to blue, to red Stroke in adults--sudden numbness or weakness of the face, arm, or leg, trouble speaking, confusion, trouble walking, loss of balance or coordination, dizziness, severe headache, change in vision Side effects that usually do not require medical attention (report to your care team  if they continue or are bothersome): Anxiety, nervousness Blurry vision Headache Loss of appetite Nausea Trouble  sleeping Weight loss This list may not describe all possible side effects. Call your doctor for medical advice about side effects. You may report side effects to FDA at 1-800-FDA-1088. Where should I keep my medication? Keep out of the reach of children and pets. This medication can be abused. Keep it in a safe place to protect it from theft. Do not share it with anyone. It is only for you. Selling or giving away this medication is dangerous and against the law. Store at room temperature between 15 and 30 degrees C (59 and 86 degrees F). Protect from light. Keep container tightly closed. Get rid of any unused medication after the expiration date. This medication may cause harm and death if it is taken by other adults, children, or pets. It is important to get rid of the medication as soon as you no longer need it or it is expired. You can do this in two ways: Take the medication to a medication take-back program. Check with your pharmacy or law enforcement to find a location. If you cannot return the medication, check the label or package insert to see if the medication should be thrown out in the garbage or flushed down the toilet. If you are not sure, ask your care team. If it is safe to put it in the trash, take the medication out of the container. Mix the medication with cat litter, dirt, coffee grounds, or other unwanted substance. Seal the mixture in a bag or container. Put it in the trash. NOTE: This sheet is a summary. It may not cover all possible information. If you have questions about this medicine, talk to your doctor, pharmacist, or health care provider.  2023 Elsevier/Gold Standard (2020-05-10 00:00:00)

## 2022-07-23 NOTE — Progress Notes (Unsigned)
BH MD OP Progress Note  07/23/2022 1:02 PM Caitlyn Harris  MRN:  [id number]  Chief Complaint:  Chief Complaint  Patient presents with   Follow-up   Anxiety   ADD   HPI: Caitlyn Harris is a 42 year old Caucasian female, married, employed, lives in [place], has a history of MDD, GAD, IBS, migraine headaches, hysterectomy, presented for medication management.  Patient reports she continues to struggle with anxiety however it is mostly situational.  She reports she and her siblings are trying to share the responsibility of taking care of their mother.  That has been anxiety provoking.  Patient reports she is currently taking sertraline 75 mg, she was under the impression she was supposed to take that dose.  That does seem to help.  Denies side effects.  Patient completed her psychological testing with Dr. Zannie Kehr and discussed psychological testing report , 1019/23 ,with patient -patient diagnosed with ADHD combined type.  Patient reports she struggles with lack of motivation, inability to complete tasks, inability to focus, concentrate.  She reports since she currently works in a high stress environment that she has to multitask it has been extremely difficult for her and her inability to complete task and stay on task does have an impact on her anxiety symptoms.  Due to her anxiety and inability to concentrate she also has trouble sleeping.  She reports when she does not sleep well at night that itself affect her ability to function the next day.  Patient is interested in trial of medication for ADHD.  She may have tried stimulants in the past however does not remember the name.  Patient denies any suicidality, homicidality or perceptual disturbances.  Patient denies any other concerns today.  Visit Diagnosis:    ICD-10-CM   1. Recurrent major depressive disorder, in partial remission  F33.41 Urine drugs of abuse scrn w alc, routine (Ref Lab)    2. GAD (generalized  anxiety disorder)  F41.1 Urine drugs of abuse scrn w alc, routine (Ref Lab)    sertraline (ZOLOFT) 50 MG tablet    3. Attention deficit hyperactivity disorder (ADHD), combined type  F90.2 Urine drugs of abuse scrn w alc, routine (Ref Lab)    EKG 12-Lead    4. High risk medication use  Z79.899 Urine drugs of abuse scrn w alc, routine (Ref Lab)    EKG 12-Lead      Past Psychiatric History: I have reviewed past psychiatric history from progress note on 11/15/2021.  Past trials of medications like Celexa, Ambien, ADHD medications like stimulants-does not remember names.  Past Medical History:  Past Medical History:  Diagnosis Date   Anxiety    Depression    Endometriosis    GERD (gastroesophageal reflux disease)    OCC   History of PCOS    Hyperlipidemia    Migraine    Seizures    FEBRILE AS A BABY    Past Surgical History:  Procedure Laterality Date   ABDOMINAL HYSTERECTOMY     CERVICAL BIOPSY  W/ LOOP ELECTRODE EXCISION     COLONOSCOPY WITH PROPOFOL N/A 01/16/2022   Procedure: COLONOSCOPY WITH PROPOFOL;  Surgeon: Wyline Mood, MD;  Location: [facility] ENDOSCOPY;  Service: Gastroenterology;  Laterality: N/A;   ECTOPIC PREGNANCY SURGERY     endometirosis     ESOPHAGOGASTRODUODENOSCOPY (EGD) WITH PROPOFOL N/A 01/16/2022   Procedure: ESOPHAGOGASTRODUODENOSCOPY (EGD) WITH PROPOFOL;  Surgeon: Wyline Mood, MD;  Location: [facility] ENDOSCOPY;  Service: Gastroenterology;  Laterality: N/A;   HAND SURGERY  THUMB SURGERY   LAPAROSCOPIC OVARIAN CYSTECTOMY Left 03/02/2018   Procedure: LAPAROSCOPIC RIGHT OVARIAN CYSTECTOMY;  Surgeon: Nadara Mustard, MD;  Location: [facility] ORS;  Service: Gynecology;  Laterality: Left;   LAPAROSCOPIC OVARIAN CYSTECTOMY Left 08/30/2019   Procedure: LAPAROSCOPIC OVARIAN CYSTECTOMY;  Surgeon: Nadara Mustard, MD;  Location: [facility] ORS;  Service: Gynecology;  Laterality: Left;   LEEP     LEEP     PERINEOPLASTY N/A 03/02/2018   Procedure: PERINEORRHAPHY;  Surgeon: Nadara Mustard, MD;  Location: [facility] ORS;  Service: Gynecology;  Laterality: N/A;   RECTOCELE REPAIR N/A 03/02/2018   Procedure: POSTERIOR REPAIR (RECTOCELE);  Surgeon: Nadara Mustard, MD;  Location: [facility] ORS;  Service: Gynecology;  Laterality: N/A;   VENTRAL HERNIA REPAIR N/A 03/19/2017   Procedure: HERNIA REPAIR VENTRAL ADULT;  Surgeon: Lattie Haw, MD;  Location: [facility] ORS;  Service: General;  Laterality: N/A;    Family Psychiatric History: Reviewed family psychiatric history from progress note on 11/15/2021.  Family History:  Family History  Problem Relation Age of Onset   Hypertension Mother    Diabetes Mother    Renal cancer Father    Hypertension Father    Post-traumatic stress disorder Brother    Drug abuse Maternal Uncle    Alcohol abuse Maternal Uncle    Bipolar disorder Half-Sister    Bipolar disorder Niece    Suicidality Other     Social History: Reviewed social history from progress note on 11/15/2021. Social History   Socioeconomic History   Marital status: Married    Spouse name: Not on file   Number of children: 2   Years of education: Not on file   Highest education level: Master's degree (e.g., MA, MS, MEng, MEd, MSW, MBA)  Occupational History   Not on file  Tobacco Use   Smoking status: Former    Years: 7    Types: Cigarettes    Quit date: 03/12/2009    Years since quitting: 13.3   Smokeless tobacco: Never   Tobacco comments:    1 PACK EVERY 2 WEEKS  Vaping Use   Vaping Use: Never used  Substance and Sexual Activity   Alcohol use: Not Currently    Comment: RARE   Drug use: No   Sexual activity: Yes  Other Topics Concern   Not on file  Social History Narrative   Right handed   Caffeine prn   Two story home   Lives with husband and two sons and mom   Currently Occcupational therapist   Social Determinants of Health   Financial Resource Strain: Not on file  Food Insecurity: Not on file  Transportation Needs: Not on file  Physical  Activity: Not on file  Stress: Not on file  Social Connections: Not on file    Allergies:  Allergies  Allergen Reactions   Tape     Low sensitivity to tape, ok with Tegaderm and paper tape     Metabolic Disorder Labs: Lab Results  Component Value Date   HGBA1C 4.7 08/17/2017   Lab Results  Component Value Date   PROLACTIN 13.3 12/21/2017   Lab Results  Component Value Date   CHOL 142 06/07/2018   TRIG 193.0 (H) 06/07/2018   HDL 28.00 (L) 06/07/2018   CHOLHDL 5 06/07/2018   VLDL 38.6 06/07/2018   LDLCALC 75 06/07/2018   Lab Results  Component Value Date   TSH 1.57 06/26/2021   TSH 0.89 08/17/2017    Therapeutic Level Labs: No results  found for: "LITHIUM" No results found for: "VALPROATE" No results found for: "CBMZ"  Current Medications: Current Outpatient Medications  Medication Sig Dispense Refill   acetaminophen (TYLENOL) 500 MG tablet Take 1,000 mg by mouth every 6 (six) hours as needed for moderate pain or headache.     clonazePAM (KLONOPIN) 0.5 MG tablet Take 0.5-1 tablets (0.25-0.5 mg total) by mouth daily as needed for anxiety. Please limit use 10 tablet 0   Fremanezumab-vfrm (AJOVY) 225 MG/1.5ML SOAJ Inject 225 mg into the skin every 28 (twenty-eight) days. 1.68 mL 11   hydrOXYzine (VISTARIL) 25 MG capsule TAKE 1-2 CAPSULES (25-50 MG TOTAL) BY MOUTH AT BEDTIME AS NEEDED. FOR SLEEP AND ANXIETY 180 capsule 0   ibuprofen (ADVIL) 400 MG tablet Take 400 mg by mouth every 6 (six) hours as needed.     omeprazole (PRILOSEC) 20 MG capsule TAKE 1 CAPSULE (20 MG TOTAL) BY MOUTH 2 (TWO) TIMES DAILY BEFORE A MEAL. 180 capsule 3   ondansetron (ZOFRAN-ODT) 8 MG disintegrating tablet Take 1 tablet (8 mg total) by mouth every 8 (eight) hours as needed for nausea or vomiting. 20 tablet 0   SUMAtriptan (IMITREX) 100 MG tablet MAY REPEAT IN 2 HOURS IF HEADACHE PERSISTS OR RECURS. 30 tablet 3   tiZANidine (ZANAFLEX) 4 MG capsule TAKE 1 CAPSULE BY MOUTH 3 TIMES DAILY AS NEEDED  FOR MUSCLE SPASMS. 30 capsule 3   topiramate (TOPAMAX) 100 MG tablet TAKE 1 TABLET BY MOUTH TWICE A DAY 180 tablet 3   Ubrogepant (UBRELVY) 100 MG TABS Take 1 tablet (100 mg total) by mouth as needed. May repeat after 2 hours.  Maximum 2 tablets in 24 hours. 16 tablet 11   sertraline (ZOLOFT) 50 MG tablet Take 1.5 tablets (75 mg total) by mouth daily after supper. 135 tablet 0   No current facility-administered medications for this visit.     Musculoskeletal: Strength & Muscle Tone: within normal limits Gait & Station: normal Patient leans: N/A  Psychiatric Specialty Exam: Review of Systems  Psychiatric/Behavioral:  Positive for decreased concentration and sleep disturbance. The patient is nervous/anxious.   All other systems reviewed and are negative.   Blood pressure 125/80, pulse 82, temperature (!) 97.3 F (36.3 C), temperature source Skin, height  (1.854 m), weight 254 lb 9.6 oz (115.5 kg).Body mass index is 33.59 kg/m.  General Appearance: Casual  Eye Contact:  Fair  Speech:  Clear and Coherent  Volume:  Normal  Mood:  Anxious  Affect:  Congruent  Thought Process:  Goal Directed and Descriptions of Associations: Intact  Orientation:  Full (Time, Place, and Person)  Thought Content: Logical   Suicidal Thoughts:  No  Homicidal Thoughts:  No  Memory:  Immediate;   Fair Recent;   Fair Remote;   Fair  Judgement:  Fair  Insight:  Fair  Psychomotor Activity:  Normal  Concentration:  Concentration: Fair and Attention Span: Fair  Recall:  [employer] of Knowledge: Fair  Language: Fair  Akathisia:  No  Handed:  Right  AIMS (if indicated): not done  Assets:  Communication Skills Desire for Improvement Housing Social Support  ADL's:  Intact  Cognition: WNL  Sleep:  Poor   Screenings: GAD-7    Loss adjuster, chartered Office Visit from 01/21/2022 in [facility] Office Visit from 12/11/2021 in [facility] Office Visit from 11/15/2021 in [facility] Office Visit from 08/11/2018 in [facility] Office Visit from 06/11/2018 in [facility]  Total GAD-7 Score PHQ2-9    Flowsheet Row Office Visit from 03/03/2022 in [facility] Counselor from 01/28/2022 [facility] Office Visit from 01/21/2022 in [facility] Office Visit from 12/11/2021 in [facility] Office Visit from 11/15/2021 in [facility]  PHQ-2 Total Score PHQ-9 Total Score Flowsheet Row Office Visit from 03/03/2022 in [facility] ED from 02/27/2022 in [facility] Video Visit from 02/12/2022 in [facility]  C-SSRS RISK CATEGORY No Risk No Risk Low Risk        Assessment and Plan: Caitlyn Harris is a 42 year old Caucasian female, currently employed, married, lives in [place], has a history of depression, anxiety, multiple medical problems recently completed psychological testing, diagnosed with ADHD combined type, presented for medication management.  Patient continues to have multiple situational stresses as well as her attention and focus symptoms affecting her anxiety in general, will benefit from the following plan.  Plan GAD-unstable Increase sertraline to 75 mg p.o. daily. Hydroxyzine 25-50 mg at bedtime as needed Klonopin 0.25-0.5 mg as needed for severe anxiety attacks Patient completed [facility] IOP in November 2023.   MDD in remission Sertraline as prescribed Patient to get established with therapist.  ADHD-unstable Reviewed and discussed ADHD psychological  testing-dated 01/23/2022 - 01/29/2022-Dr. Albertine Patricia diagnosed with ADHD combined type moderate. Discussed starting low dosage Vyvanse, patient agreeable. However will get urine drug screen as well as EKG completed.  Once I reviewed urine drug screen and EKG-will consider starting patient on Vyvanse 10 mg with plan to increase to 20 mg after 2 weeks. Reviewed [place] PMP AWARxE  High risk medication use-will order urine drug screen-patient to go to [facility] lab. Will order EKG-patient to call [phone] to get an EKG completed since patient is going to be started on a stimulant medication which does have an impact on cardiac health including blood pressure, heart rate elevation.  Provided medication education, patient was able to review controlled substance policy at this practice and was able to sign the agreement.  Follow-up in clinic in 4 weeks or sooner in person.  Collaboration of Care: Collaboration of Care: Referral or follow-up with counselor/therapist AEB provided information for psychotherapist, patient to establish care.  Patient/Guardian was advised Release of Information must be obtained prior to any record release in order to collaborate their care with an outside provider. Patient/Guardian was advised if they have not already done so to contact the registration department to sign all necessary forms in order for [place] to release information regarding their care.   Consent: Patient/Guardian gives verbal consent for treatment and assignment of benefits for services provided during this visit. Patient/Guardian expressed understanding and agreed to proceed.   This note was generated in part or whole with voice recognition software. Voice recognition is usually quite accurate but there are transcription errors that can and very often do occur. I apologize for any typographical errors that were not detected and corrected.    Jomarie Longs, MD 07/23/2022, 1:02 PM

## 2022-07-24 ENCOUNTER — Other Ambulatory Visit: Payer: Self-pay

## 2022-07-24 LAB — URINE DRUGS OF ABUSE SCREEN W ALC, ROUTINE (REF LAB)
Amphetamines, Urine: NEGATIVE ng/mL
Barbiturate, Ur: NEGATIVE ng/mL
Benzodiazepine Quant, Ur: NEGATIVE ng/mL
Cannabinoid Quant, Ur: NEGATIVE ng/mL
Cocaine (Metab.): NEGATIVE ng/mL
Ethanol U, Quan: NEGATIVE %
Methadone Screen, Urine: NEGATIVE ng/mL
Opiate Quant, Ur: NEGATIVE ng/mL
Phencyclidine, Ur: NEGATIVE ng/mL
Propoxyphene, Urine: NEGATIVE ng/mL

## 2022-07-25 ENCOUNTER — Telehealth: Payer: Self-pay | Admitting: Psychiatry

## 2022-07-25 DIAGNOSIS — F902 Attention-deficit hyperactivity disorder, combined type: Secondary | ICD-10-CM

## 2022-07-25 MED ORDER — LISDEXAMFETAMINE DIMESYLATE 10 MG PO CAPS: 10.0000 mg | ORAL_CAPSULE | Freq: Every day | ORAL | 0 refills | Status: DC

## 2022-07-25 NOTE — Telephone Encounter (Signed)
Reviewed urine drug screen -negative as well as EKG-07/23/2022-normal sinus rhythm with sinus arrhythmia.  Patient advised to monitor self, for heart palpitations or any other side effects.  Will start Vyvanse low dosage. I have sent Vyvanse 10 mg p.o. daily to pharmacy, patient to contact the office back in 10 to 15 days if she is interested in dosage increase. Reviewed [place] PMP AWARxE

## 2022-07-29 ENCOUNTER — Telehealth: Payer: Self-pay

## 2022-07-29 NOTE — Telephone Encounter (Signed)
PA initiated via CoverMyMeds for Lisdexamfetamine 10 mg capsule   PA approved  Coverage  07/28/22----07/27/25 Patient and pharmacy made aware

## 2022-08-15 ENCOUNTER — Ambulatory Visit: Payer: BC Managed Care – PPO | Admitting: Neurology

## 2022-08-19 ENCOUNTER — Telehealth: Payer: Self-pay | Admitting: Psychiatry

## 2022-08-19 NOTE — Telephone Encounter (Signed)
Noted, thank you

## 2022-08-19 NOTE — Telephone Encounter (Signed)
Patient called to reschedule her appointment due to stomach bug.also has not been able to start the ADHD medication, lisdexamfetamine. Insurance approved it but pharmacy not able to get due to it is in high demand. Manufacturer states it is on back order. Just wanted you to be aware

## 2022-08-20 ENCOUNTER — Ambulatory Visit: Payer: BC Managed Care – PPO | Admitting: Psychiatry

## 2022-08-27 ENCOUNTER — Other Ambulatory Visit: Payer: Self-pay

## 2022-08-27 ENCOUNTER — Emergency Department
Admission: EM | Admit: 2022-08-27 | Discharge: 2022-08-27 | Disposition: A | Discharge status: Home / Self Care | Payer: BC Managed Care – PPO | Attending: Emergency Medicine | Admitting: Emergency Medicine

## 2022-08-27 DIAGNOSIS — R142 Eructation: Secondary | ICD-10-CM | POA: Insufficient documentation

## 2022-08-27 DIAGNOSIS — R197 Diarrhea, unspecified: Secondary | ICD-10-CM | POA: Insufficient documentation

## 2022-08-27 DIAGNOSIS — R141 Gas pain: Secondary | ICD-10-CM | POA: Insufficient documentation

## 2022-08-27 DIAGNOSIS — R112 Nausea with vomiting, unspecified: Secondary | ICD-10-CM | POA: Insufficient documentation

## 2022-08-27 DIAGNOSIS — R143 Flatulence: Secondary | ICD-10-CM | POA: Diagnosis not present

## 2022-08-27 LAB — CBC
HCT: 43.9 % (ref 36.0–46.0)
Hemoglobin: 14.9 g/dL (ref 12.0–15.0)
MCH: 29 pg (ref 26.0–34.0)
MCHC: 33.9 g/dL (ref 30.0–36.0)
MCV: 85.6 fL (ref 80.0–100.0)
Platelets: 196 10*3/uL (ref 150–400)
RBC: 5.13 MIL/uL — ABNORMAL HIGH (ref 3.87–5.11)
RDW: 12.3 % (ref 11.5–15.5)
WBC: 8 10*3/uL (ref 4.0–10.5)
nRBC: 0 % (ref 0.0–0.2)

## 2022-08-27 LAB — URINALYSIS, ROUTINE W REFLEX MICROSCOPIC
Bilirubin Urine: NEGATIVE
Glucose, UA: NEGATIVE mg/dL
Hgb urine dipstick: NEGATIVE
Ketones, ur: NEGATIVE mg/dL
Leukocytes,Ua: NEGATIVE
Nitrite: NEGATIVE
Protein, ur: NEGATIVE mg/dL
Specific Gravity, Urine: 1.024 (ref 1.005–1.030)
pH: 5 (ref 5.0–8.0)

## 2022-08-27 LAB — COMPREHENSIVE METABOLIC PANEL
ALT: 20 U/L (ref 0–44)
AST: 22 U/L (ref 15–41)
Albumin: 4.8 g/dL (ref 3.5–5.0)
Alkaline Phosphatase: 58 U/L (ref 38–126)
Anion gap: 6 (ref 5–15)
BUN: 14 mg/dL (ref 6–20)
CO2: 18 mmol/L — ABNORMAL LOW (ref 22–32)
Calcium: 9.4 mg/dL (ref 8.9–10.3)
Chloride: 114 mmol/L — ABNORMAL HIGH (ref 98–111)
Creatinine, Ser: 0.95 mg/dL (ref 0.44–1.00)
GFR, Estimated: >60 mL/min (ref >60)
Glucose, Bld: 113 mg/dL — ABNORMAL HIGH (ref 70–99)
Potassium: 3.9 mmol/L (ref 3.5–5.1)
Sodium: 138 mmol/L (ref 135–145)
Total Bilirubin: 0.9 mg/dL (ref 0.3–1.2)
Total Protein: 8.1 g/dL (ref 6.5–8.1)

## 2022-08-27 LAB — LIPASE, BLOOD: Lipase: 41 U/L (ref 11–51)

## 2022-08-27 MED ORDER — AZITHROMYCIN 500 MG PO TABS: 500.0000 mg | ORAL_TABLET | Freq: Every day | ORAL | 0 refills | Status: AC

## 2022-08-27 NOTE — ED Notes (Signed)
Pt states symptoms improved for a day or two then got worse with new symptoms; states has been using zofran which hasn't helped nausea though hasn't vomited in several days; c/o lots of burping; abd discomfort; diarrhea; abdominal tenderness per pt. Pt's skin dry, resp reg/unlabored, and laying calmly on stretcher.

## 2022-08-27 NOTE — ED Notes (Signed)
Urine sample sent to lab

## 2022-08-27 NOTE — ED Notes (Signed)
Pt up to restroom to attempt to provide urine sample.  °

## 2022-08-27 NOTE — ED Provider Notes (Signed)
[facility] Provider Note   Event Date/Time   First MD Initiated Contact with Patient 08/27/22 1127     (approximate) History  Emesis and Diarrhea  HPI Caitlyn Harris is a 42 y.o. female with a stated past medical history of GERD, generalized anxiety disorder, and chronic migraine supersensitive bleeding of nausea/vomiting/diarrhea for the past week.  Patient denies any recent travel/sick contacts.  Patient does endorse generalized abdominal pain that is migratory and partially improves after a bowel movement.  Patient states that she has also had left shoulder pain similar to when she has had abdominal surgery in the past and they have had to use insufflation. ROS: Patient currently denies any vision changes, tinnitus, difficulty speaking, facial droop, sore throat, chest pain, shortness of breath, dysuria, or weakness/numbness/paresthesias in any extremity   Physical Exam  Triage Vital Signs: ED Triage Vitals  Enc Vitals Group     BP 08/27/22 0948 (!) 129/99     Pulse Rate 08/27/22 0948 95     Resp 08/27/22 0948 20     Temp 08/27/22 0948 98.8 F (37.1 C)     Temp src --      SpO2 08/27/22 0948 96 %     Weight 08/27/22 0950 247 lb (112 kg)     Height 08/27/22 0950 6\' 1"  (1.854 m)     Head Circumference --      Peak Flow --      Pain Score 08/27/22 0949 4     Pain Loc --      Pain Edu? --      Excl. in GC? --    Most recent vital signs: Vitals:   08/27/22 1207 08/27/22 1208  BP: (!) 122/93   Pulse: 75   Resp:  19  Temp:    SpO2: 99%    General: Awake, oriented x4. CV:  Good peripheral perfusion.  Resp:  Normal effort.  Abd:  No distention.  Other:  Middle-aged obese Caucasian female laying in bed in no acute distress ED Results / Procedures / Treatments  Labs (all labs ordered are listed, but only abnormal results are displayed) Labs Reviewed  COMPREHENSIVE METABOLIC PANEL - Abnormal; Notable for the following components:      Result  Value   Chloride 114 (*)    CO2 18 (*)    Glucose, Bld 113 (*)    All other components within normal limits  CBC - Abnormal; Notable for the following components:   RBC 5.13 (*)    All other components within normal limits  URINALYSIS, ROUTINE W REFLEX MICROSCOPIC - Abnormal; Notable for the following components:   Color, Urine YELLOW (*)    APPearance CLEAR (*)    All other components within normal limits  LIPASE, BLOOD   PROCEDURES: Critical Care performed: No .1-3 Lead EKG Interpretation  Performed by: Merwyn Katos, MD Authorized by: Merwyn Katos, MD     Interpretation: normal     ECG rate:  71   ECG rate assessment: normal     Rhythm: sinus rhythm     Ectopy: none     Conduction: normal    MEDICATIONS ORDERED IN ED: Medications - No data to display IMPRESSION / MDM / ASSESSMENT AND PLAN / ED COURSE  I reviewed the triage vital signs and the nursing notes.  The patient is on the cardiac monitor to evaluate for evidence of arrhythmia and/or significant heart rate changes. Patient's presentation is most consistent with acute presentation with potential threat to life or bodily function. Patient presents for acute nausea/vomiting The cause of the patient's symptoms is not clear, but the patient is overall well appearing and is suspected to have a transient course of illness.  Given History and Exam there does not appear to be an emergent cause of the symptoms such as small bowel obstruction, coronary syndrome, bowel ischemia, DKA, pancreatitis, appendicitis, other acute abdomen or other emergent problem.  Reassessment: After treatment, the patient is feeling much better, tolerating PO fluids, and shows no signs of dehydration.   Disposition: Discharge home with prompt primary care physician follow up in the next 48 hours. Strict return precautions discussed.   FINAL CLINICAL IMPRESSION(S) / ED DIAGNOSES   Final diagnoses:  Nausea  vomiting and diarrhea  Flatulence, eructation, and gas pain   Rx / DC Orders   ED Discharge Orders          Ordered    azithromycin (ZITHROMAX) 500 MG tablet  Daily        08/27/22 1229           Note:  This document was prepared using Dragon voice recognition software and may include unintentional dictation errors.   Merwyn Katos, MD 08/27/22 1420

## 2022-08-27 NOTE — ED Triage Notes (Signed)
Pt to ED for emesis, diarrhea intermittent x1 week. +bloating, generalized abd pain.

## 2022-08-28 ENCOUNTER — Telehealth: Payer: Self-pay

## 2022-08-28 ENCOUNTER — Ambulatory Visit: Payer: BC Managed Care – PPO | Admitting: Family Medicine

## 2022-08-28 NOTE — Transitions of Care (Post Inpatient/ED Visit) (Signed)
08/28/2022  Name: Caitlyn Harris MRN: [id number] DOB: [birth year]  Today's TOC FU Call Status: Today's TOC FU Call Status:: Successful TOC FU Call Competed TOC FU Call Complete Date: 08/28/22  Transition Care Management Follow-up Telephone Call Date of Discharge: 08/27/22 Discharge Facility: [facility]) Type of Discharge: Emergency Department Reason for ED Visit: Other: (Gas pain and Diarrhea) How have you been since you were released from the hospital?: Better Any questions or concerns?: No  Items Reviewed: Did you receive and understand the discharge instructions provided?: Yes Medications obtained,verified, and reconciled?: Yes (Medications Reviewed) Any new allergies since your discharge?: Yes Dietary orders reviewed?: No Do you have support at home?: Yes People in Home: spouse  Medications Reviewed Today: Medications Reviewed Today     Reviewed by Annabell Sabal, CMA (Certified Medical Assistant) on 08/28/22 at 1114  Med List Status: <None>   Medication Order Taking? Sig Documenting Provider Last Dose Status Informant  acetaminophen (TYLENOL) 500 MG tablet [id number] Yes Take 1,000 mg by mouth every 6 (six) hours as needed for moderate pain or headache. [provider] Taking Active Self  azithromycin (ZITHROMAX) 500 MG tablet [id number] Yes Take 1 tablet (500 mg total) by mouth daily for 3 days. Merwyn Katos, MD Taking Active   clonazePAM Scarlette Calico) 0.5 MG tablet [id number] Yes Take 0.5-1 tablets (0.25-0.5 mg total) by mouth daily as needed for anxiety. Please limit use Eappen, Saramma, MD Taking Active   Fremanezumab-vfrm (AJOVY) 225 MG/1.5ML SOAJ [id number] Yes Inject 225 mg into the skin every 28 (twenty-eight) days. Drema Dallas, DO Taking Active   hydrOXYzine (VISTARIL) 25 MG capsule [id number] Yes TAKE 1-2 CAPSULES (25-50 MG TOTAL) BY MOUTH AT BEDTIME AS NEEDED. FOR SLEEP AND ANXIETY Jomarie Longs, MD Taking Active   ibuprofen  (ADVIL) 400 MG tablet [id number] Yes Take 400 mg by mouth every 6 (six) hours as needed. [provider] Taking Active   lisdexamfetamine (VYVANSE) 10 MG capsule [id number]  Take 1 capsule (10 mg total) by mouth daily for 15 days. Jomarie Longs, MD  Expired 08/09/22 2359   omeprazole (PRILOSEC) 20 MG capsule [id number] Yes TAKE 1 CAPSULE (20 MG TOTAL) BY MOUTH 2 (TWO) TIMES DAILY BEFORE A MEAL. Wyline Mood, MD Taking Active   ondansetron (ZOFRAN-ODT) 8 MG disintegrating tablet [id number] Yes Take 1 tablet (8 mg total) by mouth every 8 (eight) hours as needed for nausea or vomiting. Wallis Bamberg, PA-C Taking Active   sertraline (ZOLOFT) 50 MG tablet [id number] Yes Take 1.5 tablets (75 mg total) by mouth daily after supper. Jomarie Longs, MD Taking Active   SUMAtriptan (IMITREX) 100 MG tablet [id number] Yes MAY REPEAT IN 2 HOURS IF HEADACHE PERSISTS OR RECURS. Tower, Audrie Gallus, MD Taking Active   tiZANidine (ZANAFLEX) 4 MG capsule [id number] Yes TAKE 1 CAPSULE BY MOUTH 3 TIMES DAILY AS NEEDED FOR MUSCLE SPASMS. Tower, Audrie Gallus, MD Taking Active   topiramate (TOPAMAX) 100 MG tablet [id number] Yes TAKE 1 TABLET BY MOUTH TWICE A DAY Tower, Marne A, MD Taking Active   Ubrogepant (UBRELVY) 100 MG TABS [id number] Yes Take 1 tablet (100 mg total) by mouth as needed. May repeat after 2 hours.  Maximum 2 tablets in 24 hours. Drema Dallas, DO Taking Active             Home Care and Equipment/Supplies: Were Home Health Services Ordered?: No Any new equipment or medical supplies ordered?: No  Functional Questionnaire: Do you need assistance with bathing/showering or dressing?:  No Do you need assistance with meal preparation?: No Do you need assistance with eating?: No Do you have difficulty maintaining continence: No Do you need assistance with getting out of bed/getting out of a chair/moving?: No Do you have difficulty managing or taking your medications?: No  Follow up appointments reviewed: PCP  Follow-up appointment confirmed?: NA (patient declined appointment , will call back if needed) Specialist Hospital Follow-up appointment confirmed?: NA Do you need transportation to your follow-up appointment?: No Do you understand care options if your condition(s) worsen?: Yes-patient verbalized understanding    SIGNATURE Annabell Sabal [facility] , [facility]

## 2022-09-02 ENCOUNTER — Telehealth: Payer: Self-pay

## 2022-09-02 NOTE — Telephone Encounter (Signed)
received fax that the lisdexametamine 10mg  capsule is not in [facility] is on backordered / unavailable. pt was last seen on 4-17 next appt 7-10

## 2022-09-02 NOTE — Telephone Encounter (Signed)
Please contact patient to let them know to call other pharmacies and once they know a pharmacy that carries it , we can send the script there. 

## 2022-09-04 ENCOUNTER — Telehealth: Payer: Self-pay

## 2022-09-04 NOTE — Telephone Encounter (Signed)
Received fax from [facility] informing that the Lisdexamfetamine 10mg  is on backorder/unavailable please advise

## 2022-09-04 NOTE — Telephone Encounter (Signed)
Noted  

## 2022-09-04 NOTE — Telephone Encounter (Signed)
I responded to this message on 09/02/2022 and do not see any response.   This was my response as below -   'Please contact patient to let them know to call other pharmacies and once they know a pharmacy that carries it , we can send the script there'.    Thank you

## 2022-09-04 NOTE — Telephone Encounter (Signed)
Called patient and she states that valerie just called her and let her know.  Done

## 2022-09-04 NOTE — Telephone Encounter (Signed)
Spoke to patient she stated that she has called other pharmacies and also her pharmacy called around to other [facility] stores and no one has it so she would like to know if there  is an alternate medication. Please advise

## 2022-09-04 NOTE — Telephone Encounter (Signed)
Please let patient know we could start low dose Adderall XR . If interested , please let me know and I can send to pharmacy.

## 2022-09-04 NOTE — Telephone Encounter (Signed)
Called patient to inform of message no answer left voicemail for patient to return call to office

## 2022-09-21 NOTE — Progress Notes (Unsigned)
[facility] New Patient Evaluation and Consultation  Referring Provider: Judy Pimple, MD PCP: Judy Pimple, MD Date of Service: 09/22/2022  SUBJECTIVE Chief Complaint: No chief complaint on file.  History of Present Illness: Caitlyn Harris is a 42 y.o. White or Caucasian female seen in consultation at the request of Dr. Shawnie Pons for evaluation of Rectocele, Cystocele, and SUI.    ***Review of records significant for: ***  Urinary Symptoms: {urine leakage?:24754} Leaks *** time(s) per {days/wks/mos/yrs:310907}.  Pad use: {NUMBERS 1-10:18281} {pad option:24752} per day.   She {ACTION; IS/IS QIO:96295284} bothered by her UI symptoms.  Day time voids ***.  Nocturia: *** times per night to void. Voiding dysfunction: she {empties:24755} her bladder well.  {DOES NOT does:27190::"does not"} use a catheter to empty bladder.  When urinating, she feels {urine symptoms:24756} Drinks: *** per day  UTIs: {NUMBERS 1-10:18281} UTI's in the last year.   {ACTIONS;DENIES/REPORTS:21021675::"Denies"} history of {urologic concerns:24757}  Pelvic Organ Prolapse Symptoms:                  She {denies/ admits to:24761} a feeling of a bulge the vaginal area. It has been present for {NUMBER 1-10:22536} {days/wks/mos/yrs:310907}.  She {denies/ admits to:24761} seeing a bulge.  This bulge {ACTION; IS/IS XLK:44010272} bothersome.  Bowel Symptom: Bowel movements: *** time(s) per {Time; day/week/month:13537} Stool consistency: {stool consistency:24758} Straining: {yes/no:19897}.  Splinting: {yes/no:19897}.  Incomplete evacuation: {yes/no:19897}.  She {denies/ admits to:24761} accidental bowel leakage / fecal incontinence  Occurs: *** time(s) per {Time; day/week/month:13537}  Consistency with leakage: {stool consistency:24758} Bowel regimen: {bowel regimen:24759} Last colonoscopy: Date ***, Results ***  Sexual Function Sexually active: {yes/no:19897}.  Sexual orientation: {Sexual  Orientation:[phone]} Pain with sex: {pain with sex:24762}  Pelvic Pain {denies/ admits to:24761} pelvic pain Location: *** Pain occurs: *** Prior pain treatment: *** Improved by: *** Worsened by: ***   Past Medical History:  Past Medical History:  Diagnosis Date   Anxiety    Depression    Endometriosis    GERD (gastroesophageal reflux disease)    OCC   History of PCOS    Hyperlipidemia    Migraine    Seizures (HCC)    FEBRILE AS A BABY     Past Surgical History:   Past Surgical History:  Procedure Laterality Date   ABDOMINAL HYSTERECTOMY     CERVICAL BIOPSY  W/ LOOP ELECTRODE EXCISION     COLONOSCOPY WITH PROPOFOL N/A 01/16/2022   Procedure: COLONOSCOPY WITH PROPOFOL;  Surgeon: Wyline Mood, MD;  Location: [facility] ENDOSCOPY;  Service: Gastroenterology;  Laterality: N/A;   ECTOPIC PREGNANCY SURGERY     endometirosis     ESOPHAGOGASTRODUODENOSCOPY (EGD) WITH PROPOFOL N/A 01/16/2022   Procedure: ESOPHAGOGASTRODUODENOSCOPY (EGD) WITH PROPOFOL;  Surgeon: Wyline Mood, MD;  Location: [facility] ENDOSCOPY;  Service: Gastroenterology;  Laterality: N/A;   HAND SURGERY     THUMB SURGERY   LAPAROSCOPIC OVARIAN CYSTECTOMY Left 03/02/2018   Procedure: LAPAROSCOPIC RIGHT OVARIAN CYSTECTOMY;  Surgeon: Nadara Mustard, MD;  Location: [facility] ORS;  Service: Gynecology;  Laterality: Left;   LAPAROSCOPIC OVARIAN CYSTECTOMY Left 08/30/2019   Procedure: LAPAROSCOPIC OVARIAN CYSTECTOMY;  Surgeon: Nadara Mustard, MD;  Location: [facility] ORS;  Service: Gynecology;  Laterality: Left;   LEEP     LEEP     PERINEOPLASTY N/A 03/02/2018   Procedure: PERINEORRHAPHY;  Surgeon: Nadara Mustard, MD;  Location: [facility] ORS;  Service: Gynecology;  Laterality: N/A;   RECTOCELE REPAIR N/A 03/02/2018   Procedure: POSTERIOR REPAIR (RECTOCELE);  Surgeon: Nadara Mustard,  MD;  Location: [facility] ORS;  Service: Gynecology;  Laterality: N/A;   VENTRAL HERNIA REPAIR N/A 03/19/2017   Procedure: HERNIA REPAIR VENTRAL ADULT;   Surgeon: Lattie Haw, MD;  Location: [facility] ORS;  Service: General;  Laterality: N/A;     Past OB/GYN History: G{NUMBERS 1-10:18281} P{NUMBERS 1-10:18281} Vaginal deliveries: ***,  Forceps/ Vacuum deliveries: ***, Cesarean section: *** Menopausal: {menopausal:24763} Contraception: ***. Last pap smear was ***.  Any history of abnormal pap smears: {yes/no:19897}.   Medications: She has a current medication list which includes the following prescription(s): acetaminophen, clonazepam, ajovy, hydroxyzine, ibuprofen, lisdexamfetamine, omeprazole, ondansetron, sertraline, sumatriptan, tizanidine, topiramate, and ubrelvy.   Allergies: Patient is allergic to tape.   Social History:  Social History   Tobacco Use   Smoking status: Former    Years: 7    Types: Cigarettes    Quit date: 03/12/2009    Years since quitting: 13.5   Smokeless tobacco: Never   Tobacco comments:    1 PACK EVERY 2 WEEKS  Vaping Use   Vaping Use: Never used  Substance Use Topics   Alcohol use: Not Currently    Comment: RARE   Drug use: No    Relationship status: {relationship status:24764} She lives with ***.   She {ACTION; IS/IS NFA:21308657} employed ***. Regular exercise: {Yes/No:304960894} History of abuse: {Yes/No:304960894}  Family History:   Family History  Problem Relation Age of Onset   Hypertension Mother    Diabetes Mother    Renal cancer Father    Hypertension Father    Post-traumatic stress disorder Brother    Drug abuse Maternal Uncle    Alcohol abuse Maternal Uncle    Bipolar disorder Half-Sister    Bipolar disorder Niece    Suicidality Other      Review of Systems: ROS   OBJECTIVE Physical Exam: There were no vitals filed for this visit.  Physical Exam   GU / Detailed Urogynecologic Evaluation:  Pelvic Exam: Normal external female genitalia; Bartholin's and Skene's glands normal in appearance; urethral meatus normal in appearance, no urethral masses or discharge.    CST: {gen negative/positive:315881}  Reflexes: bulbocavernosis {DESC; PRESENT/NOT PRESENT:21021351}, anocutaneous {DESC; PRESENT/NOT PRESENT:21021351} ***bilaterally.  Speculum exam reveals normal vaginal mucosa {With/Without:20273} atrophy. Cervix {exam; gyn cervix:30847}. Uterus {exam; pelvic uterus:30849}. Adnexa {exam; adnexa:12223}.    s/p hysterectomy: Speculum exam reveals normal vaginal mucosa {With/Without:20273}  atrophy and normal vaginal cuff.  Adnexa {exam; adnexa:12223}.    With apex supported, anterior compartment defect was {reduced:24765}  Pelvic floor strength {Roman # I-V:19040}/V, puborectalis {Roman # I-V:19040}/V external anal sphincter {Roman # I-V:19040}/V  Pelvic floor musculature: Right levator {Tender/Non-tender:20250}, Right obturator {Tender/Non-tender:20250}, Left levator {Tender/Non-tender:20250}, Left obturator {Tender/Non-tender:20250}  POP-Q:   POP-Q                                               Aa                                               Ba  C                                                Gh                                               Pb                                               tvl                                                Ap                                               Bp                                                 D      Rectal Exam:  Normal sphincter tone, {rectocele:24766} distal rectocele, enterocoele {DESC; PRESENT/NOT PRESENT:21021351}, no rectal masses, {sign of:24767} dyssynergia when asking the patient to bear down.  Post-Void Residual (PVR) by Bladder Scan: In order to evaluate bladder emptying, we discussed obtaining a postvoid residual and she agreed to this procedure.  Procedure: The ultrasound unit was placed on the patient's abdomen in the suprapubic region after the patient had voided. A PVR of *** ml was obtained by bladder scan.  Laboratory  Results: @ENCLABS @   ***I visualized the urine specimen, noting the specimen to be {urine color:24768}  ASSESSMENT AND PLAN Ms. Kornacki is a 42 y.o. with: No diagnosis found.    Selmer Dominion, NP   Medical Decision Making:  - Reviewed/ ordered a clinical laboratory test - Reviewed/ ordered a radiologic study - Reviewed/ ordered medicine test - Decision to obtain old records - Discussion of management of or test interpretation with an external physician / other healthcare professional  - Assessment requiring independent historian - Review and summation of prior records - Independent review of image, tracing or specimen

## 2022-09-22 ENCOUNTER — Ambulatory Visit: Payer: BC Managed Care – PPO | Admitting: Obstetrics and Gynecology

## 2022-09-22 ENCOUNTER — Encounter: Payer: Self-pay | Admitting: Obstetrics and Gynecology

## 2022-09-22 VITALS — BP 121/89 | HR 80 | Ht 72.8 in | Wt 248.0 lb

## 2022-09-22 DIAGNOSIS — N8110 Cystocele, unspecified: Secondary | ICD-10-CM | POA: Diagnosis not present

## 2022-09-22 DIAGNOSIS — R350 Frequency of micturition: Secondary | ICD-10-CM

## 2022-09-22 DIAGNOSIS — M6289 Other specified disorders of muscle: Secondary | ICD-10-CM

## 2022-09-22 DIAGNOSIS — K469 Unspecified abdominal hernia without obstruction or gangrene: Secondary | ICD-10-CM | POA: Diagnosis not present

## 2022-09-22 LAB — POCT URINALYSIS DIPSTICK
Bilirubin, UA: NEGATIVE
Blood, UA: NEGATIVE
Glucose, UA: NEGATIVE
Ketones, UA: NEGATIVE
Leukocytes, UA: NEGATIVE
Nitrite, UA: NEGATIVE
Protein, UA: NEGATIVE
Spec Grav, UA: >=1.03 — AB (ref 1.010–1.025)
Urobilinogen, UA: 0.2 E.U./dL
pH, UA: 5.5 (ref 5.0–8.0)

## 2022-09-22 NOTE — Patient Instructions (Addendum)
Consider surgical options.   Start pelvic floor PT.

## 2022-10-15 ENCOUNTER — Telehealth (INDEPENDENT_AMBULATORY_CARE_PROVIDER_SITE_OTHER): Payer: BC Managed Care – PPO | Admitting: Psychiatry

## 2022-10-15 ENCOUNTER — Encounter: Payer: Self-pay | Admitting: Psychiatry

## 2022-10-15 DIAGNOSIS — F411 Generalized anxiety disorder: Secondary | ICD-10-CM | POA: Diagnosis not present

## 2022-10-15 DIAGNOSIS — F3341 Major depressive disorder, recurrent, in partial remission: Secondary | ICD-10-CM | POA: Diagnosis not present

## 2022-10-15 DIAGNOSIS — F902 Attention-deficit hyperactivity disorder, combined type: Secondary | ICD-10-CM

## 2022-10-15 MED ORDER — HYDROXYZINE PAMOATE 25 MG PO CAPS: 25.0000 mg | ORAL_CAPSULE | Freq: Every evening | ORAL | 0 refills | Status: AC | PRN

## 2022-10-15 MED ORDER — SERTRALINE HCL 50 MG PO TABS: 75.0000 mg | ORAL_TABLET | Freq: Every day | ORAL | 0 refills | Status: DC

## 2022-10-15 MED ORDER — CLONAZEPAM 0.5 MG PO TABS: 0.2500 mg | ORAL_TABLET | Freq: Every day | ORAL | 0 refills | Status: DC | PRN

## 2022-10-15 MED ORDER — AMPHETAMINE-DEXTROAMPHET ER 20 MG PO CP24: 20.0000 mg | ORAL_CAPSULE | Freq: Every morning | ORAL | 0 refills | Status: DC

## 2022-10-15 NOTE — Progress Notes (Signed)
Virtual Visit via Video Note  I connected with Caitlyn Harris on 10/15/22 at  1:00 PM EDT by a video enabled telemedicine application and verified that I am speaking with the correct person using two identifiers.  Location Provider Location : Remote Office  Patient Location : Home  Participants: Patient , Provider   I discussed the limitations of evaluation and management by telemedicine and the availability of in person appointments. The patient expressed understanding and agreed to proceed.   I discussed the assessment and treatment plan with the patient. The patient was provided an opportunity to ask questions and all were answered. The patient agreed with the plan and demonstrated an understanding of the instructions.   The patient was advised to call back or seek an in-person evaluation if the symptoms worsen or if the condition fails to improve as anticipated.    BH MD OP Progress Note  10/15/2022 2:18 PM Caitlyn Harris  MRN:  [id number]  Chief Complaint:  Chief Complaint  Patient presents with   Follow-up   Anxiety   Depression   ADD   Medication Refill   HPI: Caitlyn Harris is a 42 year old Caucasian female, married, employed, lives in [place], has a history of MDD, GAD, IBS, migraine headaches, hysterectomy, was evaluated by telemedicine today.  Patient's last visit was on 07/23/2022.  At that time she was initiated on Vyvanse.  Patient however reports she was unable to get it from the pharmacy.  She is interested in changing her Vyvanse to another stimulant to target her ADHD symptoms.  She reports she continues to struggle with attention and focus, has trouble taking care of her home, getting her chores done.  She reports recently she had a talk with her spouse who seemed to be upset since although she is currently on a break from work she has been unable to do a lot of chores around the house and her home is a mess.  Patient agreeable to trial of Adderall  extended release.  She reports she also has had episodes of sadness, anhedonia, low energy.  She reports she is currently struggling with migraine headaches.  She and her provider has been trying to get this under control since the past several months.  That likely contributing to some of her depression symptoms.  She also reports she struggles  internally regarding her conflicts with her mother.  Her mother moved away to live with her sister in [place].  She recently had a talk with her mother and apologized to her.  She is aware that she needs to start psychotherapy, she was provided resources for therapist several times in the past.  Patient reports recent sleep problems however since she started taking hydroxyzine as needed that has helped.  She is compliant on the Zoloft and currently takes 75 mg daily.  She would like to stay on this dosage for now.  Patient denies any suicidality, homicidality or perceptual disturbances.  Patient denies any other concerns today.  Visit Diagnosis:    ICD-10-CM   1. Recurrent major depressive disorder, in partial remission (HCC)  F33.41 hydrOXYzine (VISTARIL) 25 MG capsule    clonazePAM (KLONOPIN) 0.5 MG tablet    2. GAD (generalized anxiety disorder)  F41.1 hydrOXYzine (VISTARIL) 25 MG capsule    sertraline (ZOLOFT) 50 MG tablet    clonazePAM (KLONOPIN) 0.5 MG tablet    3. Attention deficit hyperactivity disorder (ADHD), combined type  F90.2 amphetamine-dextroamphetamine (ADDERALL XR) 20 MG 24 hr capsule  Past Psychiatric History: I have reviewed past psychiatric history from progress note on 11/15/2021.  Past trials of medications like Celexa, Ambien, ADHD medications like stimulants-does not remember the names. Patient had ADHD testing-per Dr. Helmut Muster 01/29/2022-ADHD combined type.  Past Medical History:  Past Medical History:  Diagnosis Date   Anxiety    Depression    Endometriosis    GERD (gastroesophageal reflux disease)     OCC   History of PCOS    Hyperlipidemia    Migraine    Seizures (HCC)    FEBRILE AS A BABY    Past Surgical History:  Procedure Laterality Date   ABDOMINAL HYSTERECTOMY     CERVICAL BIOPSY  W/ LOOP ELECTRODE EXCISION     COLONOSCOPY WITH PROPOFOL N/A 01/16/2022   Procedure: COLONOSCOPY WITH PROPOFOL;  Surgeon: Wyline Mood, MD;  Location: [facility] ENDOSCOPY;  Service: Gastroenterology;  Laterality: N/A;   ECTOPIC PREGNANCY SURGERY     endometirosis     ESOPHAGOGASTRODUODENOSCOPY (EGD) WITH PROPOFOL N/A 01/16/2022   Procedure: ESOPHAGOGASTRODUODENOSCOPY (EGD) WITH PROPOFOL;  Surgeon: Wyline Mood, MD;  Location: [facility] ENDOSCOPY;  Service: Gastroenterology;  Laterality: N/A;   HAND SURGERY     THUMB SURGERY   LAPAROSCOPIC OVARIAN CYSTECTOMY Left 03/02/2018   Procedure: LAPAROSCOPIC RIGHT OVARIAN CYSTECTOMY;  Surgeon: Nadara Mustard, MD;  Location: [facility] ORS;  Service: Gynecology;  Laterality: Left;   LAPAROSCOPIC OVARIAN CYSTECTOMY Left 08/30/2019   Procedure: LAPAROSCOPIC OVARIAN CYSTECTOMY;  Surgeon: Nadara Mustard, MD;  Location: [facility] ORS;  Service: Gynecology;  Laterality: Left;   LEEP     LEEP     PERINEOPLASTY N/A 03/02/2018   Procedure: PERINEORRHAPHY;  Surgeon: Nadara Mustard, MD;  Location: [facility] ORS;  Service: Gynecology;  Laterality: N/A;   RECTOCELE REPAIR N/A 03/02/2018   Procedure: POSTERIOR REPAIR (RECTOCELE);  Surgeon: Nadara Mustard, MD;  Location: [facility] ORS;  Service: Gynecology;  Laterality: N/A;   VENTRAL HERNIA REPAIR N/A 03/19/2017   Procedure: HERNIA REPAIR VENTRAL ADULT;  Surgeon: Lattie Haw, MD;  Location: [facility] ORS;  Service: General;  Laterality: N/A;    Family Psychiatric History: I have reviewed family psychiatric history from progress note on 11/15/2021.  Family History:  Family History  Problem Relation Age of Onset   Hypertension Mother    Diabetes Mother    Renal cancer Father    Hypertension Father    Post-traumatic stress disorder Brother     Drug abuse Maternal Uncle    Alcohol abuse Maternal Uncle    Colon cancer Paternal Aunt    Bipolar disorder Half-Sister    Bipolar disorder Niece    Suicidality Other     Social History: I have reviewed social history from progress note on 11/15/2021. Social History   Socioeconomic History   Marital status: Married    Spouse name: Not on file   Number of children: 2   Years of education: Not on file   Highest education level: Master's degree (e.g., MA, MS, MEng, MEd, MSW, MBA)  Occupational History   Not on file  Tobacco Use   Smoking status: Former    Years: 7    Types: Cigarettes    Quit date: 03/12/2009    Years since quitting: 13.6   Smokeless tobacco: Never   Tobacco comments:    1 PACK EVERY 2 WEEKS  Vaping Use   Vaping Use: Never used  Substance and Sexual Activity   Alcohol use: Not Currently    Comment: RARE   Drug  use: No   Sexual activity: Yes  Other Topics Concern   Not on file  Social History Narrative   Right handed   Caffeine prn   Two story home   Lives with husband and two sons and mom   Currently Occcupational therapist   Social Determinants of Health   Financial Resource Strain: Not on file  Food Insecurity: Not on file  Transportation Needs: Not on file  Physical Activity: Not on file  Stress: Not on file  Social Connections: Not on file    Allergies:  Allergies  Allergen Reactions   Tape     Low sensitivity to tape, ok with Tegaderm and paper tape     Metabolic Disorder Labs: Lab Results  Component Value Date   HGBA1C 4.7 08/17/2017   Lab Results  Component Value Date   PROLACTIN 13.3 12/21/2017   Lab Results  Component Value Date   CHOL 142 06/07/2018   TRIG 193.0 (H) 06/07/2018   HDL 28.00 (L) 06/07/2018   CHOLHDL 5 06/07/2018   VLDL 38.6 06/07/2018   LDLCALC 75 06/07/2018   Lab Results  Component Value Date   TSH 1.57 06/26/2021   TSH 0.89 08/17/2017    Therapeutic Level Labs: No results found for:  "LITHIUM" No results found for: "VALPROATE" No results found for: "CBMZ"  Current Medications: Current Outpatient Medications  Medication Sig Dispense Refill   acetaminophen (TYLENOL) 500 MG tablet Take 1,000 mg by mouth every 6 (six) hours as needed for moderate pain or headache.     amphetamine-dextroamphetamine (ADDERALL XR) 20 MG 24 hr capsule Take 1 capsule (20 mg total) by mouth in the morning. 30 capsule 0   Fremanezumab-vfrm (AJOVY) 225 MG/1.5ML SOAJ Inject 225 mg into the skin every 28 (twenty-eight) days. 1.68 mL 11   ibuprofen (ADVIL) 400 MG tablet Take 400 mg by mouth every 6 (six) hours as needed.     omeprazole (PRILOSEC) 20 MG capsule TAKE 1 CAPSULE (20 MG TOTAL) BY MOUTH 2 (TWO) TIMES DAILY BEFORE A MEAL. 180 capsule 3   ondansetron (ZOFRAN-ODT) 8 MG disintegrating tablet Take 1 tablet (8 mg total) by mouth every 8 (eight) hours as needed for nausea or vomiting. 20 tablet 0   tiZANidine (ZANAFLEX) 4 MG capsule TAKE 1 CAPSULE BY MOUTH 3 TIMES DAILY AS NEEDED FOR MUSCLE SPASMS. 30 capsule 3   topiramate (TOPAMAX) 100 MG tablet TAKE 1 TABLET BY MOUTH TWICE A DAY 180 tablet 3   Ubrogepant (UBRELVY) 100 MG TABS Take 1 tablet (100 mg total) by mouth as needed. May repeat after 2 hours.  Maximum 2 tablets in 24 hours. 16 tablet 11   clonazePAM (KLONOPIN) 0.5 MG tablet Take 0.5-1 tablets (0.25-0.5 mg total) by mouth daily as needed for anxiety. Please limit use 10 tablet 0   hydrOXYzine (VISTARIL) 25 MG capsule Take 1-2 capsules (25-50 mg total) by mouth at bedtime as needed. For sleep and anxiety 180 capsule 0   sertraline (ZOLOFT) 50 MG tablet Take 1.5 tablets (75 mg total) by mouth daily after supper. 135 tablet 0   SUMAtriptan (IMITREX) 100 MG tablet MAY REPEAT IN 2 HOURS IF HEADACHE PERSISTS OR RECURS. (Patient not taking: Reported on 10/15/2022) 30 tablet 3   No current facility-administered medications for this visit.     Musculoskeletal: Strength & Muscle Tone:  UTA Gait &  Station:  Seated Patient leans: N/A  Psychiatric Specialty Exam: Review of Systems  Psychiatric/Behavioral:  Positive for decreased concentration, dysphoric mood and  sleep disturbance.     There were no vitals taken for this visit.There is no height or weight on file to calculate BMI.  General Appearance: Fairly Groomed  Eye Contact:  Fair  Speech:  Clear and Coherent  Volume:  Normal  Mood:  Depressed  Affect:  Congruent  Thought Process:  Goal Directed and Descriptions of Associations: Intact  Orientation:  Full (Time, Place, and Person)  Thought Content: Logical   Suicidal Thoughts:  No  Homicidal Thoughts:  No  Memory:  Immediate;   Fair Recent;   Fair Remote;   Fair  Judgement:  Good  Insight:  Fair  Psychomotor Activity:  Normal  Concentration:  Concentration: Fair and Attention Span: Fair  Recall:  [employer] of Knowledge: Fair  Language: Fair  Akathisia:  No  Handed:  Right  AIMS (if indicated): not done  Assets:  Communication Skills Desire for Improvement Housing Social Support  ADL's:  Intact  Cognition: WNL  Sleep:   Improving   Screenings: GAD-7    Flowsheet Row Office Visit from 07/23/2022 in [facility] Office Visit from 01/21/2022 in [facility] Office Visit from 12/11/2021 in [facility] Office Visit from 11/15/2021 in [facility] Office Visit from 08/11/2018 in [facility]  Total GAD-7 Score 8 14 7 11 3       PHQ2-9    Flowsheet Row Video Visit from 10/15/2022 in [facility] Office Visit from 07/23/2022 in [facility] Office Visit from 03/03/2022 in [facility] Counselor from [facility]/28/2022 [facility] Office Visit from  01/21/2022 in [facility]  PHQ-2 Total Score 1 3 2 4 5   PHQ-9 Total Score 9 16 9 18 21       Flowsheet Row Video Visit from 10/15/2022 in [facility] ED from 08/27/2022 in [facility] at [facility] Visit from 07/23/2022 in [facility]  C-SSRS RISK CATEGORY No Risk No Risk No Risk        Assessment and Plan: Caitlyn Harris is a 42 year old Caucasian female, currently employed, married, lives in [place], has a history of depression, anxiety, ADHD, multiple medical problems including current migraine headaches, currently with some improvement on the Zoloft with regards to her mood symptoms although she continues to struggle with attention and focus problems, unable to fill her Vyvanse, discussed plan as noted below.  Plan GAD-some improvement Sertraline 75 mg p.o. daily Hydroxyzine 25-50 mg at bedtime as needed Klonopin 0.25-0.5 mg as needed for severe anxiety attacks. Patient completed [facility] IOP in November 2023 Patient noncompliant with CBT, encouraged to establish care with a therapist.  Patient does have resources available.  MDD-in partial remission Sertraline 75 mg p.o. daily. Patient encouraged to establish care with therapist.  ADHD-unstable Patient had ADHD psychological testing-per Dr. Beryle Beams Barker-01/23/2022 - 01/29/2022-patient meets criteria for ADHD combined type. Discontinue Vyvanse for noncompliance, on backorder. Start Adderall extended release 20 mg p.o. daily in the morning.  Provided education, including drug to drug interaction with medications like Imitrex, sertraline, risk for serotonin syndrome, effect on heart, sleep problems, effect on anxiety. Patient to monitor. Reviewed [place] PMP AWARxE Urine drug screen-dated 07/23/2022-within normal limits. EKG-07/23/2022-normal sinus rhythm with sinus  arrhythmia.   Follow-up in clinic in  4 to 5 weeks or sooner in person.  Collaboration of Care: Collaboration of Care: Referral or follow-up with counselor/therapist AEB patient encouraged to establish care with therapist.  Patient/Guardian was advised Release of Information must be obtained prior to any record release in order to collaborate their care with an outside provider. Patient/Guardian was advised if they have not already done so to contact the registration department to sign all necessary forms in order for [place] to release information regarding their care.   Consent: Patient/Guardian gives verbal consent for treatment and assignment of benefits for services provided during this visit. Patient/Guardian expressed understanding and agreed to proceed.   This note was generated in part or whole with voice recognition software. Voice recognition is usually quite accurate but there are transcription errors that can and very often do occur. I apologize for any typographical errors that were not detected and corrected.    Jomarie Longs, MD 10/15/2022, 2:18 PM

## 2022-10-15 NOTE — Patient Instructions (Signed)
Amphetamine; Dextroamphetamine Extended-Release Capsules What is this medication? AMPHETAMINE; DEXTROAMPHETAMINE (am FET a meen; dex troe am FET a meen) treats attention-deficit hyperactivity disorder (ADHD). It works by improving focus and reducing impulsive behavior. It belongs to a group of medications called stimulants. This medicine may be used for other purposes; ask your health care provider or pharmacist if you have questions. COMMON BRAND NAME(S): Adderall XR, Mydayis What should I tell my care team before I take this medication? They need to know if you have any of these conditions: Anxiety or panic attacks Circulation problems in fingers or toes (Raynaud syndrome) Glaucoma Heart attack Heart disease High blood pressure Kidney disease Liver disease Mental health conditions Seizures Stroke Substance use disorder Suicidal thoughts, plans, or attempt by you or a family member Thyroid disease Tourette syndrome An unusual or allergic reaction to dextroamphetamine, other medications, foods, dyes, or preservatives Pregnant or trying to get pregnant Breastfeeding How should I use this medication? Take this medication by mouth with water. Take it as directed on the prescription label at the same time every day. You can take it with or without food. If it upsets your stomach, take it with food. Do not cut, crush, or chew this medication. Swallow the capsules whole. You may open the capsule and put the contents in 1 teaspoon of applesauce. Swallow the medication and applesauce right away. Do not chew the medication or applesauce. Keep taking it unless your care team tells you to stop. A special MedGuide will be given to you by the pharmacist with each prescription and refill. Be sure to read this information carefully each time. Talk to your care team about the use of this medication in children. While it may be prescribed for children as young as 6 years for selected conditions,  precautions do apply. Overdosage: If you think you have taken too much of this medicine contact a poison control center or emergency room at once. NOTE: This medicine is only for you. Do not share this medicine with others. What if I miss a dose? If you miss a dose, take it as soon as you can in the morning, but do not take it later in the day because it can cause trouble sleeping. If it is almost time for your next dose, take only that dose. Do not take double or extra doses. What may interact with this medication? Do not take this medication with any of the following: Linezolid MAOIs, such as Marplan, Nardil, and Parnate Methylene blue This medication may also interact with the following: Acetazolamide Alcohol Ascorbic acid Certain medications for depression, anxiety, or other mental health conditions Certain medications for migraines, such as sumatriptan Guanethidine Opioids Reserpine Sodium bicarbonate St. John's wort Thiazide diuretics, such as chlorothiazide Tryptophan This list may not describe all possible interactions. Give your health care provider a list of all the medicines, herbs, non-prescription drugs, or dietary supplements you use. Also tell them if you smoke, drink alcohol, or use illegal drugs. Some items may interact with your medicine. What should I watch for while using this medication? Visit your care team for regular checks on your progress. Tell your care team if your symptoms do not start to get better or if they get worse. This medication requires a new prescription from your care team every time it is filled at the pharmacy. This medication can be abused and cause your brain and body to depend on it after high doses or long term use. Your care team will assess your  risk and monitor you closely during treatment. Long term use of this medication may cause your brain and body to depend on it. You may be able to take breaks from this medication during weekends,  holidays, or summer vacations. Talk to your care team about what works for you. If your care team wants you to stop this medication permanently, the dose may be slowly lowered over time to reduce the risk of side effects. Tell your care team if this medication loses its effects, or if you feel you need to take more than the prescribed amount. Do not change your dose without talking to your care team. Do not drink alcohol while taking this medication. Drinking alcohol may alter the effects of some brands of this medication. Serious side effects may occur. Do not take this medication close to bedtime. It may prevent you from sleeping. Loss of appetite is common when starting this medication. Eating small, frequent meals or snacks can help. Talk to your care team if appetite loss persists. Children should have height and weight checked often while taking this medication. Tell your care team right away if you notice unexplained wounds on your fingers and toes while taking this medication. You should also tell your care team if you experience numbness or pain, changes in the skin color, or sensitivity to temperature in your fingers or toes. Contact your care team right away if you have an erection that lasts longer than 4 hours or if it becomes painful. This may be a sign of a serious problem and must be treated right away to prevent permanent damage. What side effects may I notice from receiving this medication? Side effects that you should report to your care team as soon as possible: Allergic reactions--skin rash, itching, hives, swelling of the face, lips, tongue, or throat Heart attack--pain or tightness in the chest, shoulders, arms, or jaw, nausea, shortness of breath, cold or clammy skin, feeling faint or lightheaded Heart rhythm changes--fast or irregular heartbeat, dizziness, feeling faint or lightheaded, chest pain, trouble breathing Increase in blood pressure Irritability, confusion, fast or  irregular heartbeat, muscle stiffness, twitching muscles, sweating, high fever, seizure, chills, vomiting, diarrhea, which may be signs of serotonin syndrome Mood and behavior changes--anxiety, nervousness, confusion, hallucinations, irritability, hostility, thoughts of suicide or self-harm, worsening mood, feelings of depression Prolonged or painful erection Raynaud syndrome--cool, numb, or painful fingers or toes that may change color from pale, to blue, to red Seizures Stroke--sudden numbness or weakness of the face, arm, or leg, trouble speaking, confusion, trouble walking, loss of balance or coordination, dizziness, severe headache, change in vision Side effects that usually do not require medical attention (report these to your care team if they continue or are bothersome): Dry mouth Headache Loss of appetite with weight loss Nausea Stomach pain Trouble sleeping This list may not describe all possible side effects. Call your doctor for medical advice about side effects. You may report side effects to FDA at 1-800-FDA-1088. Where should I keep my medication? Keep out of the reach of children and pets. This medication can be abused. Keep it in a safe place to protect it from theft. Do not share it with anyone. It is only for you. Selling or giving away this medication is dangerous and against the law. Store at room temperature between 15 and 30 degrees C (59 and 86 degrees F). Protect from light and moisture. Keep container tightly closed. Get rid of any unused medication after the expiration date. This medication may  cause harm and death if it is taken by other adults, children, or pets. It is important to get rid of the medication as soon as you no longer need it or it is expired. You can do this in two ways: Take the medication to a medication take-back program. Check with your pharmacy or law enforcement to find a location. If you cannot return the medication, check the label or package  insert to see if the medication should be thrown out in the garbage or flushed down the toilet. If you are not sure, ask your care team. If it is safe to put it in the trash, take the medication out of the container. Mix the medication with cat litter, dirt, coffee grounds, or other unwanted substance. Seal the mixture in a bag or container. Put it in the trash. NOTE: This sheet is a summary. It may not cover all possible information. If you have questions about this medicine, talk to your doctor, pharmacist, or health care provider.  2024 Elsevier/Gold Standard (2022-06-01 00:00:00)

## 2022-10-27 ENCOUNTER — Telehealth: Payer: Self-pay

## 2022-10-27 NOTE — Telephone Encounter (Signed)
received notice from [email or web address] and submitted a prior auth on the amphetamine-dextroamphet

## 2022-11-01 NOTE — Telephone Encounter (Signed)
left message that prior auth was approved from 10-31-22 to 10-30-2025

## 2022-11-01 NOTE — Telephone Encounter (Signed)
prior Berkley Harvey was approved from 10-31-22 to 10-30-2025

## 2022-11-13 ENCOUNTER — Ambulatory Visit: Payer: BC Managed Care – PPO | Admitting: Psychiatry

## 2022-11-17 ENCOUNTER — Ambulatory Visit: Payer: BC Managed Care – PPO | Admitting: Obstetrics and Gynecology
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
## 2022-12-11 ENCOUNTER — Ambulatory Visit: Payer: BC Managed Care – PPO | Admitting: Neurology

## 2022-12-23 ENCOUNTER — Ambulatory Visit: Payer: BC Managed Care – PPO | Admitting: Psychiatry
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
## 2022-12-25 ENCOUNTER — Ambulatory Visit: Payer: BC Managed Care – PPO | Admitting: Psychiatry

## 2022-12-25 ENCOUNTER — Encounter: Payer: Self-pay | Admitting: Psychiatry

## 2022-12-25 VITALS — BP 114/81 | HR 101 | Temp 96.7°F | Ht 73.0 in | Wt 255.4 lb

## 2022-12-25 DIAGNOSIS — F902 Attention-deficit hyperactivity disorder, combined type: Secondary | ICD-10-CM | POA: Diagnosis not present

## 2022-12-25 DIAGNOSIS — Z634 Disappearance and death of family member: Secondary | ICD-10-CM | POA: Diagnosis not present

## 2022-12-25 DIAGNOSIS — F3341 Major depressive disorder, recurrent, in partial remission: Secondary | ICD-10-CM

## 2022-12-25 DIAGNOSIS — F411 Generalized anxiety disorder: Secondary | ICD-10-CM

## 2022-12-25 MED ORDER — AMPHETAMINE-DEXTROAMPHET ER 20 MG PO CP24: 20.0000 mg | ORAL_CAPSULE | Freq: Every morning | ORAL | 0 refills | Status: DC

## 2022-12-25 MED ORDER — AMPHETAMINE-DEXTROAMPHET ER 20 MG PO CP24: 20.0000 mg | ORAL_CAPSULE | Freq: Every day | ORAL | 0 refills | Status: DC

## 2022-12-25 MED ORDER — SERTRALINE HCL 50 MG PO TABS: 75.0000 mg | ORAL_TABLET | Freq: Every day | ORAL | 0 refills | Status: DC

## 2022-12-25 NOTE — Progress Notes (Signed)
BH MD OP Progress Note  12/25/2022 3:14 PM Caitlyn Harris  MRN:  [id number]  Chief Complaint:  Chief Complaint  Patient presents with   Follow-up   Depression   Anxiety   ADD   Medication Refill   HPI:  Caitlyn Harris is a 42 year old Caucasian female, married, employed, lives in [place], has a history of MDD, GAD, IBS, migraine headaches, hysterectomy was evaluated in office today.  Patient today reports she lost her mother August 9.  Her mother was struggling with medical problems and was in hospice.  She was able to spent some time with her mother and her sister.  Patient reports she is currently grieving her loss.  She has good days and bad days.  Overall she has been coping okay.  Patient continues to struggle with migraine headache.  Currently trying to work with her providers on the same.  The headaches does affect her energy level during the day and also sleep at night on and off.  Even when she sleeps for long hours she continues to feel not rested in the morning.  She has upcoming appointment with her headache specialist.  Patient reports attention and focus is improved on the Adderall extended release 20 mg.  She does not believe the Adderall has any effect on her anxiety or sleep.  She reports she is able to prioritize and do her job duties better than before.  She does continue to have problems with motivation, easy distractibility however she has been coping better than before.  Patient is currently compliant on the Zoloft.  Denies side effects.  She denies any suicidality, homicidality or perceptual disturbances.  Patient currently denies having a therapist however reports she has information for hospice therapy.  She is also trying to find a therapist for herself to start individual therapy.  Patient denies any other concerns today.    Visit Diagnosis:    ICD-10-CM   1. Recurrent major depressive disorder, in partial remission (HCC)  F33.41     2. GAD  (generalized anxiety disorder)  F41.1 sertraline (ZOLOFT) 50 MG tablet    3. Attention deficit hyperactivity disorder (ADHD), combined type  F90.2 amphetamine-dextroamphetamine (ADDERALL XR) 20 MG 24 hr capsule    amphetamine-dextroamphetamine (ADDERALL XR) 20 MG 24 hr capsule    amphetamine-dextroamphetamine (ADDERALL XR) 20 MG 24 hr capsule    4. Bereavement  Z63.4       Past Psychiatric History: I have reviewed past psychiatric history from progress note on 11/15/2021.  Past trials of medications like Celexa, Ambien, ADHD medications like stimulants-does not remember all the names.  Vyvanse was recently tried. Patient had ADHD testing-per Dr. Beryle Beams Barker-01/29/2022-ADHD combined type.  Past Medical History:  Past Medical History:  Diagnosis Date   Anxiety    Depression    Endometriosis    GERD (gastroesophageal reflux disease)    OCC   History of PCOS    Hyperlipidemia    Migraine    Seizures (HCC)    FEBRILE AS A BABY    Past Surgical History:  Procedure Laterality Date   ABDOMINAL HYSTERECTOMY     CERVICAL BIOPSY  W/ LOOP ELECTRODE EXCISION     COLONOSCOPY WITH PROPOFOL N/A 01/16/2022   Procedure: COLONOSCOPY WITH PROPOFOL;  Surgeon: Wyline Mood, MD;  Location: [facility] ENDOSCOPY;  Service: Gastroenterology;  Laterality: N/A;   ECTOPIC PREGNANCY SURGERY     endometirosis     ESOPHAGOGASTRODUODENOSCOPY (EGD) WITH PROPOFOL N/A 01/16/2022   Procedure: ESOPHAGOGASTRODUODENOSCOPY (EGD) WITH  PROPOFOL;  Surgeon: Wyline Mood, MD;  Location: [facility] ENDOSCOPY;  Service: Gastroenterology;  Laterality: N/A;   HAND SURGERY     THUMB SURGERY   LAPAROSCOPIC OVARIAN CYSTECTOMY Left 03/02/2018   Procedure: LAPAROSCOPIC RIGHT OVARIAN CYSTECTOMY;  Surgeon: Nadara Mustard, MD;  Location: [facility] ORS;  Service: Gynecology;  Laterality: Left;   LAPAROSCOPIC OVARIAN CYSTECTOMY Left 08/30/2019   Procedure: LAPAROSCOPIC OVARIAN CYSTECTOMY;  Surgeon: Nadara Mustard, MD;  Location: [facility] ORS;  Service:  Gynecology;  Laterality: Left;   LEEP     LEEP     PERINEOPLASTY N/A 03/02/2018   Procedure: PERINEORRHAPHY;  Surgeon: Nadara Mustard, MD;  Location: [facility] ORS;  Service: Gynecology;  Laterality: N/A;   RECTOCELE REPAIR N/A 03/02/2018   Procedure: POSTERIOR REPAIR (RECTOCELE);  Surgeon: Nadara Mustard, MD;  Location: [facility] ORS;  Service: Gynecology;  Laterality: N/A;   VENTRAL HERNIA REPAIR N/A 03/19/2017   Procedure: HERNIA REPAIR VENTRAL ADULT;  Surgeon: Lattie Haw, MD;  Location: [facility] ORS;  Service: General;  Laterality: N/A;    Family Psychiatric History: I have reviewed family psychiatric history from progress note on 11/15/2021.  Family History:  Family History  Problem Relation Age of Onset   Hypertension Mother    Diabetes Mother    Renal cancer Father    Hypertension Father    Post-traumatic stress disorder Brother    Drug abuse Maternal Uncle    Alcohol abuse Maternal Uncle    Colon cancer Paternal Aunt    Bipolar disorder Half-Sister    Bipolar disorder Niece    Suicidality Other     Social History: I have reviewed social history from progress note on 11/15/2021. Social History   Socioeconomic History   Marital status: Married    Spouse name: Not on file   Number of children: 2   Years of education: Not on file   Highest education level: Master's degree (e.g., MA, MS, MEng, MEd, MSW, MBA)  Occupational History   Not on file  Tobacco Use   Smoking status: Former    Current packs/day: 0.00    Types: Cigarettes    Start date: 03/12/2002    Quit date: 03/12/2009    Years since quitting: 13.8   Smokeless tobacco: Never   Tobacco comments:    1 PACK EVERY 2 WEEKS  Vaping Use   Vaping status: Never Used  Substance and Sexual Activity   Alcohol use: Not Currently    Comment: RARE   Drug use: No   Sexual activity: Yes  Other Topics Concern   Not on file  Social History Narrative   Right handed   Caffeine prn   Two story home   Lives with husband  and two sons and mom   Currently Occcupational therapist   Social Determinants of Health   Financial Resource Strain: Not on file  Food Insecurity: Not on file  Transportation Needs: Not on file  Physical Activity: Not on file  Stress: Not on file  Social Connections: Not on file    Allergies:  Allergies  Allergen Reactions   Tape     Low sensitivity to tape, ok with Tegaderm and paper tape     Metabolic Disorder Labs: Lab Results  Component Value Date   HGBA1C 4.7 08/17/2017   Lab Results  Component Value Date   PROLACTIN 13.3 12/21/2017   Lab Results  Component Value Date   CHOL 142 06/07/2018   TRIG 193.0 (H) 06/07/2018   HDL 28.00 (L)  06/07/2018   CHOLHDL 5 06/07/2018   VLDL 38.6 06/07/2018   LDLCALC 75 06/07/2018   Lab Results  Component Value Date   TSH 1.57 06/26/2021   TSH 0.89 08/17/2017    Therapeutic Level Labs: No results found for: "LITHIUM" No results found for: "VALPROATE" No results found for: "CBMZ"  Current Medications: Current Outpatient Medications  Medication Sig Dispense Refill   acetaminophen (TYLENOL) 500 MG tablet Take 1,000 mg by mouth every 6 (six) hours as needed for moderate pain or headache.     [START ON 01/23/2023] amphetamine-dextroamphetamine (ADDERALL XR) 20 MG 24 hr capsule Take 1 capsule (20 mg total) by mouth in the morning. 30 capsule 0   [START ON 02/21/2023] amphetamine-dextroamphetamine (ADDERALL XR) 20 MG 24 hr capsule Take 1 capsule (20 mg total) by mouth daily. 30 capsule 0   clonazePAM (KLONOPIN) 0.5 MG tablet Take 0.5-1 tablets (0.25-0.5 mg total) by mouth daily as needed for anxiety. Please limit use 10 tablet 0   Fremanezumab-vfrm (AJOVY) 225 MG/1.5ML SOAJ Inject 225 mg into the skin every 28 (twenty-eight) days. 1.68 mL 11   hydrOXYzine (VISTARIL) 25 MG capsule Take 1-2 capsules (25-50 mg total) by mouth at bedtime as needed. For sleep and anxiety 180 capsule 0   ibuprofen (ADVIL) 400 MG tablet Take 400 mg by  mouth every 6 (six) hours as needed.     omeprazole (PRILOSEC) 20 MG capsule TAKE 1 CAPSULE (20 MG TOTAL) BY MOUTH 2 (TWO) TIMES DAILY BEFORE A MEAL. 180 capsule 3   ondansetron (ZOFRAN-ODT) 8 MG disintegrating tablet Take 1 tablet (8 mg total) by mouth every 8 (eight) hours as needed for nausea or vomiting. 20 tablet 0   SUMAtriptan (IMITREX) 100 MG tablet MAY REPEAT IN 2 HOURS IF HEADACHE PERSISTS OR RECURS. 30 tablet 3   tiZANidine (ZANAFLEX) 4 MG capsule TAKE 1 CAPSULE BY MOUTH 3 TIMES DAILY AS NEEDED FOR MUSCLE SPASMS. 30 capsule 3   topiramate (TOPAMAX) 100 MG tablet TAKE 1 TABLET BY MOUTH TWICE A DAY 180 tablet 3   Ubrogepant (UBRELVY) 100 MG TABS Take 1 tablet (100 mg total) by mouth as needed. May repeat after 2 hours.  Maximum 2 tablets in 24 hours. 16 tablet 11   amphetamine-dextroamphetamine (ADDERALL XR) 20 MG 24 hr capsule Take 1 capsule (20 mg total) by mouth in the morning. 30 capsule 0   sertraline (ZOLOFT) 50 MG tablet Take 1.5 tablets (75 mg total) by mouth daily after supper. 135 tablet 0   No current facility-administered medications for this visit.     Musculoskeletal: Strength & Muscle Tone: within normal limits Gait & Station: normal Patient leans: N/A  Psychiatric Specialty Exam: Review of Systems  Psychiatric/Behavioral:  Positive for decreased concentration.        Grieving    Blood pressure 114/81, pulse (!) 101, temperature (!) 96.7 F (35.9 C), temperature source Skin, height 6\' 1"  (1.854 m), weight 255 lb 6.4 oz (115.8 kg).Body mass index is 33.7 kg/m.  General Appearance: Fairly Groomed  Eye Contact:  Fair  Speech:  Clear and Coherent  Volume:  Normal  Mood:   grieving  Affect:  Tearful  Thought Process:  Goal Directed and Descriptions of Associations: Intact  Orientation:  Full (Time, Place, and Person)  Thought Content: Logical   Suicidal Thoughts:  No  Homicidal Thoughts:  No  Memory:  Immediate;   Fair Recent;   Fair Remote;   Fair   Judgement:  Fair  Insight:  Fair  Psychomotor Activity:  Normal  Concentration:  Concentration: Fair and Attention Span: Fair  Recall:  [employer] of Knowledge: Fair  Language: Fair  Akathisia:  No  Handed:  Right  AIMS (if indicated): not done  Assets:  Communication Skills Desire for Improvement Housing Social Support  ADL's:  Intact  Cognition: WNL  Sleep:   improving   Screenings: GAD-7    Flowsheet Row Office Visit from 12/25/2022 in [facility] Office Visit from 07/23/2022 in [facility] Office Visit from 01/21/2022 in [facility] Office Visit from 12/11/2021 in [facility] Office Visit from 11/15/2021 in [facility]  Total GAD-7 Score 13 8 14 7 11       PHQ2-9    Flowsheet Row Office Visit from 12/25/2022 in [facility] Video Visit from 10/15/2022 in [facility] Office Visit from 07/23/2022 in [facility] Office Visit from 03/03/2022 in [facility] Counselor from 01/28/2022 in [facility]  PHQ-2 Total Score 6 1 3 2 4   PHQ-9 Total Score 21 9 16 9 18       Flowsheet Row Office Visit from 12/25/2022 in [facility] Video Visit from 10/15/2022 in [facility] ED from 08/27/2022 in [facility] Emergency Department at [facility]  C-SSRS RISK CATEGORY No Risk No Risk No Risk        Assessment and Plan: Caitlyn Harris is a 42 year old Caucasian female, currently employed, married, lives in [place], has a history of depression, anxiety, ADHD, multiple medical problems including migraine headaches, currently  struggling with grief reaction to the death of her mother who passed away a month ago.  Patient will benefit from following plan.  Plan GAD-improving Sertraline 75 mg p.o. daily Hydroxyzine 25-50 mg at bedtime as needed Klonopin 0.25 0.5 mg as needed for severe anxiety attacks Patient completed [facility] IOP in November 2023. Patient to establish care with a therapist-encourage her to do so.  MDD-likely in partial remission Currently complicated by her recent grief reaction to the death of her mother. Continue sertraline 75 mg p.o. daily.  Patient with migraine headaches, chronic with recent exacerbation, will manage headache management prior to readjusting the dosage of SSRI. Patient also encouraged to establish care with therapist to start CBT  ADHD-improving Continue Adderall extended release 20 mg p.o. daily in the morning.  Will consider increasing the dosage in the future as needed Reviewed [place] PMP AWARxE UDS-07/23/2022-within normal limits.   Bereavement-improving Provided grief counseling. Patient offered grief counseling referral, provided information for hospice.  Patient will benefit from establishing care with a therapist.   Patient currently struggling with chronic fatigue, headaches constant, not feeling rested after a good night's sleep, unknown if this is due to her headaches.  Once headaches are managed well we will consider referral for sleep study.   Collaboration of Care: Collaboration of Care: Referral or follow-up with counselor/therapist AEB encouraged to establish care with therapist.  As well as follow up with headache specialist for migraine headaches management.  Patient/Guardian was advised Release of Information must be obtained prior to any record release in order to collaborate their care with an outside provider. Patient/Guardian was advised if they have not already done so to contact the registration department to sign all necessary forms in  order for [place] to  release information regarding their care.   Consent: Patient/Guardian gives verbal consent for treatment and assignment of benefits for services provided during this visit. Patient/Guardian expressed understanding and agreed to proceed.   Follow-up in clinic in 3 months or sooner if needed.  This note was generated in part or whole with voice recognition software. Voice recognition is usually quite accurate but there are transcription errors that can and very often do occur. I apologize for any typographical errors that were not detected and corrected.    Jomarie Longs, MD 12/26/2022, 8:56 AM

## 2022-12-25 NOTE — Patient Instructions (Signed)
Managing Loss, Adult People experience loss in many different ways throughout their lives. Events such as moving, changing jobs, and losing friends can create a sense of loss. The loss may be as serious as a major health change, divorce, death of a pet, or death of a loved one. All of these types of loss are likely to create a physical and emotional reaction known as grief. Grief is the result of a major change or an absence of something or someone that you count on. Grief is a normal reaction to loss. A variety of factors can affect your grieving experience, including: The nature of your loss. Your relationship to what or whom you lost. Your understanding of grief and how to manage it. Your support system. Be aware that when grief becomes extreme, it can lead to more severe issues like isolation, depression, anxiety, or suicidal thoughts. Talk with your health care provider if you have any of these issues. How to manage lifestyle changes Keep to your normal routine as much as possible. If you have trouble focusing or doing normal activities, it is acceptable to take some time away from your normal routine. Spend time with friends and loved ones. Eat a healthy diet, get plenty of sleep, and rest when you feel tired. How to recognize changes  The way that you deal with your grief will affect your ability to function as you normally do. When grieving, you may experience these changes: Numbness, shock, sadness, anxiety, anger, denial, and guilt. Thoughts about death. Unexpected crying. A physical sensation of emptiness in your stomach. Problems sleeping and eating. Tiredness (fatigue). Loss of interest in normal activities. Dreaming about or imagining seeing the person who died. A need to remember what or whom you lost. Difficulty thinking about anything other than your loss for a period of time. Relief. If you have been expecting the loss for a while, you may feel a sense of relief when it  happens. Follow these instructions at home: Activity Express your feelings in healthy ways, such as: Talking with others about your loss. It may be helpful to find others who have had a similar loss, such as a support group. Writing down your feelings in a journal. Doing physical activities to release stress and emotional energy. Doing creative activities like painting, sculpting, or playing or listening to music. Practicing resilience. This is the ability to recover and adjust after facing challenges. Reading some resources that encourage resilience may help you to learn ways to practice those behaviors.  General instructions Be patient with yourself and others. Allow the grieving process to happen, and remember that grieving takes time. It is likely that you may never feel completely done with some grief. You may find a way to move on while still cherishing memories and feelings about your loss. Accepting your loss is a process. It can take months or longer to adjust. Keep all follow-up visits. This is important. Where to find support To get support for managing loss: Ask your health care provider for help and recommendations, such as grief counseling or therapy. Think about joining a support group for people who are managing a loss. Where to find more information You can find more information about managing loss from: American Society of Clinical Oncology: www.cancer.net American Psychological Association: [employer] Contact a health care provider if: Your grief is extreme and keeps getting worse. You have ongoing grief that does not improve. Your body shows symptoms of grief, such as illness. You feel depressed, anxious, or  hopeless. Get help right away if: You have thoughts about hurting yourself or others. Get help right away if you feel like you may hurt yourself or others, or have thoughts about taking your own life. Go to your nearest emergency room or: Call 911. Call the  National Suicide Prevention Lifeline at 323-417-3760 or 988. This is open 24 hours a day. Text the Crisis Text Line at 251-831-1540. Summary Grief is the result of a major change or an absence of someone or something that you count on. Grief is a normal reaction to loss. The depth of grief and the period of recovery depend on the type of loss and your ability to adjust to the change and process your feelings. Processing grief requires patience and a willingness to accept your feelings and talk about your loss with people who are supportive. It is important to find resources that work for you and to realize that people experience grief differently. There is not one grieving process that works for everyone in the same way. Be aware that when grief becomes extreme, it can lead to more severe issues like isolation, depression, anxiety, or suicidal thoughts. Talk with your health care provider if you have any of these issues. This information is not intended to replace advice given to you by your health care provider. Make sure you discuss any questions you have with your health care provider. Document Revised: 11/12/2020 Document Reviewed: 11/12/2020 Elsevier Patient Education  2024 [employer].

## 2022-12-26 ENCOUNTER — Ambulatory Visit: Payer: BC Managed Care – PPO | Admitting: Neurology

## 2022-12-26 DIAGNOSIS — Z029 Encounter for administrative examinations, unspecified: Secondary | ICD-10-CM

## 2022-12-29 ENCOUNTER — Encounter: Payer: Self-pay | Admitting: Neurology

## 2023-01-06 ENCOUNTER — Telehealth: Payer: Self-pay

## 2023-01-06 NOTE — Telephone Encounter (Signed)
PA needed for ubrelvy 100 mg.

## 2023-01-09 ENCOUNTER — Other Ambulatory Visit: Payer: Self-pay | Admitting: Family Medicine

## 2023-01-09 DIAGNOSIS — G43709 Chronic migraine without aura, not intractable, without status migrainosus: Secondary | ICD-10-CM

## 2023-01-12 NOTE — Telephone Encounter (Signed)
Last filled on 01/06/22 #180 tabs/ 3 refills   Last OV was 02/10/22 and no future appts.

## 2023-01-12 NOTE — Telephone Encounter (Signed)
Refilled once   Please schedule follow up in late fall or early winter

## 2023-01-13 NOTE — Telephone Encounter (Signed)
Patient has been scheduled

## 2023-02-05 ENCOUNTER — Other Ambulatory Visit (HOSPITAL_COMMUNITY): Payer: Self-pay

## 2023-02-05 ENCOUNTER — Telehealth: Payer: Self-pay | Admitting: Pharmacy Technician

## 2023-02-05 NOTE — Telephone Encounter (Signed)
Pharmacy Patient Advocate Encounter   Received notification from Pt Calls Messages that prior authorization for Bernita Raisin is required/requested.   Insurance verification completed.   The patient is insured through [facility] .   Per test claim: [id number]

## 2023-02-12 ENCOUNTER — Other Ambulatory Visit (HOSPITAL_COMMUNITY): Payer: Self-pay

## 2023-02-12 NOTE — Telephone Encounter (Signed)
Pharmacy Patient Advocate Encounter  Received notification from [employer] [facility] that Prior Authorization for Bernita Raisin has been APPROVED from 02/05/23 to 02/04/24 Filled 02/05/23  PA #/Case ID/Reference #: PA Case ID #: [id number]

## 2023-03-09 NOTE — Progress Notes (Unsigned)
Subjective:    Patient ID: Caitlyn Harris, female    DOB: 08/06/1980, 42 y.o.   MRN: [id number]  HPI  Wt Readings from Last 3 Encounters:  03/10/23 250 lb (113.4 kg)  09/22/22 248 lb (112.5 kg)  08/27/22 247 lb (112 kg)   32.98 kg/m  Vitals:   03/10/23 0856  BP: 120/76  Pulse: 85  Temp: 98.9 F (37.2 C)  SpO2: 99%    Pt presents for follow up of migraine disorder and chronic medical problems including vitamin D def and hypertriglyceridemia  Also vaginal itching and discharge   Mother died in Nov 19, 2022 (renal failure and cva) -has had a tough year  Coping fairly She did take care of her  Has 3 sibs/ good support    A lot of loss (dad a year ago, them GM)  Vaginal discharge - itching / odor White discharge  Itching is out and inside -mild  More than a month    Last vitamin D Lab Results  Component Value Date   VD25OH 26.24 (L) 06/07/2018  Not taking vitamin D    Hypertriglyceridemia   Lab Results  Component Value Date   CHOL 142 06/07/2018   HDL 28.00 (L) 06/07/2018   LDLCALC 75 06/07/2018   LDLDIRECT 71.0 12/04/2017   TRIG 193.0 (H) 06/07/2018   CHOLHDL 5 06/07/2018   Sees psychiatry for depression /anxiety   Sees neurology for headaches    Results for orders placed or performed in visit on 03/10/23  POCT Wet Prep [facility])  Result Value Ref Range   Source Wet Prep POC vaginal    WBC, Wet Prep HPF POC few    Bacteria Wet Prep HPF POC Few Few   BACTERIA WET PREP MORPHOLOGY POC     Clue Cells Wet Prep HPF POC Many (A) None   Clue Cells Wet Prep Whiff POC     Yeast Wet Prep HPF POC None None   KOH Wet Prep POC None None   Trichomonas Wet Prep HPF POC Absent Absent      Patient Active Problem List   Diagnosis Date Noted   Vaginal discharge 03/10/2023   Diabetes mellitus screening 03/10/2023   Attention deficit hyperactivity disorder (ADHD), combined type 07/23/2022   High risk medication use 07/23/2022   Stress incontinence 02/26/2022    Adenomatous polyp of colon    Dyspepsia    Low back pain 12/19/2021   MDD (major depressive disorder), recurrent severe, without psychosis (HCC) 11/15/2021   Bereavement 11/15/2021   Anxiety 08/21/2021   Attention and concentration deficit 08/21/2021   Cervical lymphadenopathy 07/09/2021   Heartburn 06/26/2021   Obesity (BMI 30-39.9) 04/16/2020   Hypertriglyceridemia 06/11/2018   Endometriosis 02/01/2018   Chronic female pelvic pain 02/01/2018   Rectocele 01/18/2018   Insomnia 08/25/2017   Cystocele, midline 08/25/2017   Chronic migraine without aura without status migrainosus, not intractable 02/04/2016   Depression 02/04/2016   GAD (generalized anxiety disorder) 02/04/2016   Vitamin D deficiency, unspecified 02/04/2016   Past Medical History:  Diagnosis Date   Anxiety    Depression    Endometriosis    GERD (gastroesophageal reflux disease)    OCC   History of PCOS    Hyperlipidemia    Migraine    Seizures (HCC)    FEBRILE AS A BABY   Past Surgical History:  Procedure Laterality Date   ABDOMINAL HYSTERECTOMY     CERVICAL BIOPSY  W/ LOOP ELECTRODE EXCISION     COLONOSCOPY  WITH PROPOFOL N/A 01/16/2022   Procedure: COLONOSCOPY WITH PROPOFOL;  Surgeon: Wyline Mood, MD;  Location: [facility] ENDOSCOPY;  Service: Gastroenterology;  Laterality: N/A;   ECTOPIC PREGNANCY SURGERY     endometirosis     ESOPHAGOGASTRODUODENOSCOPY (EGD) WITH PROPOFOL N/A 01/16/2022   Procedure: ESOPHAGOGASTRODUODENOSCOPY (EGD) WITH PROPOFOL;  Surgeon: Wyline Mood, MD;  Location: [facility] ENDOSCOPY;  Service: Gastroenterology;  Laterality: N/A;   HAND SURGERY     THUMB SURGERY   LAPAROSCOPIC OVARIAN CYSTECTOMY Left 03/02/2018   Procedure: LAPAROSCOPIC RIGHT OVARIAN CYSTECTOMY;  Surgeon: Nadara Mustard, MD;  Location: [facility] ORS;  Service: Gynecology;  Laterality: Left;   LAPAROSCOPIC OVARIAN CYSTECTOMY Left 08/30/2019   Procedure: LAPAROSCOPIC OVARIAN CYSTECTOMY;  Surgeon: Nadara Mustard, MD;  Location:  [facility] ORS;  Service: Gynecology;  Laterality: Left;   LEEP     LEEP     PERINEOPLASTY N/A 03/02/2018   Procedure: PERINEORRHAPHY;  Surgeon: Nadara Mustard, MD;  Location: [facility] ORS;  Service: Gynecology;  Laterality: N/A;   RECTOCELE REPAIR N/A 03/02/2018   Procedure: POSTERIOR REPAIR (RECTOCELE);  Surgeon: Nadara Mustard, MD;  Location: [facility] ORS;  Service: Gynecology;  Laterality: N/A;   VENTRAL HERNIA REPAIR N/A 03/19/2017   Procedure: HERNIA REPAIR VENTRAL ADULT;  Surgeon: Lattie Haw, MD;  Location: [facility] ORS;  Service: General;  Laterality: N/A;   Social History   Tobacco Use   Smoking status: Former    Current packs/day: 0.00    Types: Cigarettes    Start date: 03/12/2002    Quit date: 03/12/2009    Years since quitting: 14.0   Smokeless tobacco: Never   Tobacco comments:    1 PACK EVERY 2 WEEKS  Vaping Use   Vaping status: Never Used  Substance Use Topics   Alcohol use: Not Currently    Comment: RARE   Drug use: No   Family History  Problem Relation Age of Onset   Hypertension Mother    Diabetes Mother    CVA Mother    Chronic Renal Failure Mother    Renal cancer Father    Hypertension Father    Post-traumatic stress disorder Brother    Drug abuse Maternal Uncle    Alcohol abuse Maternal Uncle    Colon cancer Paternal Aunt    Bipolar disorder Half-Sister    Bipolar disorder Niece    Suicidality Other    Allergies  Allergen Reactions   Tape     Low sensitivity to tape, ok with Tegaderm and paper tape    Current Outpatient Medications on File Prior to Visit  Medication Sig Dispense Refill   acetaminophen (TYLENOL) 500 MG tablet Take 1,000 mg by mouth every 6 (six) hours as needed for moderate pain or headache.     amphetamine-dextroamphetamine (ADDERALL XR) 20 MG 24 hr capsule Take 1 capsule (20 mg total) by mouth in the morning. 30 capsule 0   amphetamine-dextroamphetamine (ADDERALL XR) 20 MG 24 hr capsule Take 1 capsule (20 mg total) by mouth in the  morning. 30 capsule 0   amphetamine-dextroamphetamine (ADDERALL XR) 20 MG 24 hr capsule Take 1 capsule (20 mg total) by mouth daily. 30 capsule 0   Fremanezumab-vfrm (AJOVY) 225 MG/1.5ML SOAJ Inject 225 mg into the skin every 28 (twenty-eight) days. 1.68 mL 11   hydrOXYzine (VISTARIL) 25 MG capsule Take 1-2 capsules (25-50 mg total) by mouth at bedtime as needed. For sleep and anxiety 180 capsule 0   ibuprofen (ADVIL) 400 MG tablet Take 400 mg by mouth  every 6 (six) hours as needed.     omeprazole (PRILOSEC) 20 MG capsule TAKE 1 CAPSULE (20 MG TOTAL) BY MOUTH 2 (TWO) TIMES DAILY BEFORE A MEAL. 180 capsule 3   ondansetron (ZOFRAN-ODT) 8 MG disintegrating tablet Take 1 tablet (8 mg total) by mouth every 8 (eight) hours as needed for nausea or vomiting. 20 tablet 0   sertraline (ZOLOFT) 50 MG tablet Take 1.5 tablets (75 mg total) by mouth daily after supper. 135 tablet 0   tiZANidine (ZANAFLEX) 4 MG capsule TAKE 1 CAPSULE BY MOUTH 3 TIMES DAILY AS NEEDED FOR MUSCLE SPASMS. 30 capsule 3   topiramate (TOPAMAX) 100 MG tablet TAKE 1 TABLET BY MOUTH TWICE A DAY 180 tablet 0   Ubrogepant (UBRELVY) 100 MG TABS Take 1 tablet (100 mg total) by mouth as needed. May repeat after 2 hours.  Maximum 2 tablets in 24 hours. 16 tablet 11   clonazePAM (KLONOPIN) 0.5 MG tablet Take 0.5-1 tablets (0.25-0.5 mg total) by mouth daily as needed for anxiety. Please limit use (Patient not taking: Reported on 03/10/2023) 10 tablet 0   SUMAtriptan (IMITREX) 100 MG tablet MAY REPEAT IN 2 HOURS IF HEADACHE PERSISTS OR RECURS. (Patient not taking: Reported on 03/10/2023) 30 tablet 3   No current facility-administered medications on file prior to visit.    Review of Systems  Constitutional:  Positive for fatigue. Negative for activity change, appetite change, fever and unexpected weight change.  HENT:  Negative for congestion, ear pain, rhinorrhea, sinus pressure and sore throat.   Eyes:  Negative for pain, redness and visual  disturbance.  Respiratory:  Negative for cough, shortness of breath and wheezing.   Cardiovascular:  Negative for chest pain and palpitations.  Gastrointestinal:  Negative for abdominal pain, blood in stool, constipation and diarrhea.  Endocrine: Negative for polydipsia and polyuria.  Genitourinary:  Positive for vaginal discharge and vaginal pain. Negative for dysuria, frequency and urgency.  Musculoskeletal:  Negative for arthralgias, back pain and myalgias.  Skin:  Negative for pallor and rash.  Allergic/Immunologic: Negative for environmental allergies.  Neurological:  Negative for dizziness, syncope and headaches.  Hematological:  Negative for adenopathy. Does not bruise/bleed easily.  Psychiatric/Behavioral:  Negative for decreased concentration and dysphoric mood. The patient is not nervous/anxious.        Recent grief        Objective:   Physical Exam Exam conducted with a chaperone present.  Constitutional:      General: She is not in acute distress.    Appearance: Normal appearance. She is well-developed. She is obese. She is not ill-appearing or diaphoretic.  HENT:     Head: Normocephalic and atraumatic.  Eyes:     Conjunctiva/sclera: Conjunctivae normal.     Pupils: Pupils are equal, round, and reactive to light.  Neck:     Thyroid: No thyromegaly.     Vascular: No carotid bruit or JVD.  Cardiovascular:     Rate and Rhythm: Normal rate and regular rhythm.     Heart sounds: Normal heart sounds.     No gallop.  Pulmonary:     Effort: Pulmonary effort is normal. No respiratory distress.     Breath sounds: Normal breath sounds. No wheezing or rales.  Abdominal:     General: There is no distension or abdominal bruit.     Palpations: Abdomen is soft.  Genitourinary:    Exam position: Supine.     Pubic Area: No rash.      Labia:  Right: No rash, tenderness or lesion.        Left: No rash, tenderness or lesion.      Urethra: No prolapse.     Vagina: Vaginal  discharge present. No erythema, tenderness or bleeding.  Musculoskeletal:     Cervical back: Normal range of motion and neck supple.     Right lower leg: No edema.     Left lower leg: No edema.  Lymphadenopathy:     Cervical: No cervical adenopathy.  Skin:    General: Skin is warm and dry.     Coloration: Skin is not pale.     Findings: No rash.  Neurological:     Mental Status: She is alert.     Coordination: Coordination normal.     Deep Tendon Reflexes: Reflexes are normal and symmetric. Reflexes normal.  Psychiatric:        Attention and Perception: Attention normal.        Mood and Affect: Mood is depressed.        Cognition and Memory: Cognition and memory normal.     Comments: Candidly discusses symptoms and stressors             Assessment & Plan:   Problem List Items Addressed This Visit       Other   Vitamin D deficiency, unspecified    D level today  Stressed importance to bone and overall health       Relevant Orders   VITAMIN D 25 Hydroxy (Vit-D Deficiency, Fractures)   Vaginal discharge - Primary    Clue cells on wet prep  Will treatment with oral flagyl 500 mg bid for 7 d for BV Update if not starting to improve in a week or if worsening  Call back and Er precautions noted in detail today        Relevant Orders   POCT Wet Prep [facility]) (Completed)   Obesity (BMI 30-39.9)    Discussed how this problem influences overall health and the risks it imposes  Reviewed plan for weight loss with lower calorie diet (via better food choices (lower glycemic and portion control) along with exercise building up to or more than 30 minutes 5 days per week including some aerobic activity and strength training         Hypertriglyceridemia    Labs today  Not fasting unfortunately  Working on diet   Disc goals for lipids and reasons to control them Rev last labs with pt Rev low sat fat diet in detail       Relevant Orders   Comprehensive metabolic  panel   Lipid panel   TSH   Diabetes mellitus screening    In setting of obesity  A1c ordered       Relevant Orders   Hemoglobin A1c

## 2023-03-10 ENCOUNTER — Ambulatory Visit: Payer: BC Managed Care – PPO | Admitting: Family Medicine

## 2023-03-10 VITALS — BP 120/76 | HR 85 | Temp 98.9°F | Ht 73.0 in | Wt 250.0 lb

## 2023-03-10 DIAGNOSIS — Z6832 Body mass index (BMI) 32.0-32.9, adult: Secondary | ICD-10-CM

## 2023-03-10 DIAGNOSIS — F411 Generalized anxiety disorder: Secondary | ICD-10-CM

## 2023-03-10 DIAGNOSIS — E781 Pure hyperglyceridemia: Secondary | ICD-10-CM | POA: Diagnosis not present

## 2023-03-10 DIAGNOSIS — Z131 Encounter for screening for diabetes mellitus: Secondary | ICD-10-CM

## 2023-03-10 DIAGNOSIS — E669 Obesity, unspecified: Secondary | ICD-10-CM

## 2023-03-10 DIAGNOSIS — E559 Vitamin D deficiency, unspecified: Secondary | ICD-10-CM | POA: Diagnosis not present

## 2023-03-10 DIAGNOSIS — N898 Other specified noninflammatory disorders of vagina: Secondary | ICD-10-CM

## 2023-03-10 LAB — COMPREHENSIVE METABOLIC PANEL
ALT: 13 U/L (ref 0–35)
AST: 17 U/L (ref 0–37)
Albumin: 4.6 g/dL (ref 3.5–5.2)
Alkaline Phosphatase: 58 U/L (ref 39–117)
BUN: 11 mg/dL (ref 6–23)
CO2: 25 meq/L (ref 19–32)
Calcium: 9.1 mg/dL (ref 8.4–10.5)
Chloride: 108 meq/L (ref 96–112)
Creatinine, Ser: 0.8 mg/dL (ref 0.40–1.20)
GFR: 92.33 mL/min (ref >60.00)
Glucose, Bld: 112 mg/dL — ABNORMAL HIGH (ref 70–99)
Potassium: 3.8 meq/L (ref 3.5–5.1)
Sodium: 139 meq/L (ref 135–145)
Total Bilirubin: 0.6 mg/dL (ref 0.2–1.2)
Total Protein: 7.2 g/dL (ref 6.0–8.3)

## 2023-03-10 LAB — POCT WET PREP (WET MOUNT): Trichomonas Wet Prep HPF POC: ABSENT

## 2023-03-10 LAB — LDL CHOLESTEROL, DIRECT: Direct LDL: 61 mg/dL

## 2023-03-10 LAB — LIPID PANEL
Cholesterol: 170 mg/dL (ref 0–200)
HDL: 31.2 mg/dL — ABNORMAL LOW (ref >39.00)
Total CHOL/HDL Ratio: 5
Triglycerides: 593 mg/dL — ABNORMAL HIGH (ref 0.0–149.0)

## 2023-03-10 LAB — HEMOGLOBIN A1C: Hgb A1c MFr Bld: 5.2 % (ref 4.6–6.5)

## 2023-03-10 LAB — TSH: TSH: 1.52 u[IU]/mL (ref 0.35–5.50)

## 2023-03-10 LAB — VITAMIN D 25 HYDROXY (VIT D DEFICIENCY, FRACTURES): VITD: 12.88 ng/mL — ABNORMAL LOW (ref 30.00–100.00)

## 2023-03-10 MED ORDER — METRONIDAZOLE 500 MG PO TABS: 500.0000 mg | ORAL_TABLET | Freq: Two times a day (BID) | ORAL | 0 refills | Status: AC

## 2023-03-10 NOTE — Assessment & Plan Note (Signed)
 Discussed how this problem influences overall health and the risks it imposes  Reviewed plan for weight loss with lower calorie diet (via better food choices (lower glycemic and portion control) along with exercise building up to or more than 30 minutes 5 days per week including some aerobic activity and strength training

## 2023-03-10 NOTE — Assessment & Plan Note (Signed)
In setting of obesity  A1c ordered

## 2023-03-10 NOTE — Assessment & Plan Note (Signed)
Clue cells on wet prep  Will treatment with oral flagyl 500 mg bid for 7 d for BV Update if not starting to improve in a week or if worsening  Call back and Er precautions noted in detail today

## 2023-03-10 NOTE — Assessment & Plan Note (Signed)
Labs today  Not fasting unfortunately  Working on diet   Disc goals for lipids and reasons to control them Rev last labs with pt Rev low sat fat diet in detail

## 2023-03-10 NOTE — Assessment & Plan Note (Signed)
D level today  Stressed importance to bone and overall health

## 2023-03-10 NOTE — Patient Instructions (Addendum)
Labs today for chemistry, blood sugar, cholesterol and D   For bacterial vaginosis  Take the generic flagyl 500 mg twice daily for a week  If symptoms not improved after that course let us know   A probiotic daily may help restore vaginal flora  Food - yogurt Or a supplement is helpful    Take care of yourself   Labs today

## 2023-03-11 ENCOUNTER — Encounter: Payer: Self-pay | Admitting: Psychiatry

## 2023-03-11 ENCOUNTER — Ambulatory Visit: Payer: BC Managed Care – PPO | Admitting: Psychiatry

## 2023-03-11 VITALS — BP 141/92 | HR 108 | Temp 95.9°F | Ht 73.0 in | Wt 250.0 lb

## 2023-03-11 DIAGNOSIS — F411 Generalized anxiety disorder: Secondary | ICD-10-CM | POA: Diagnosis not present

## 2023-03-11 DIAGNOSIS — F902 Attention-deficit hyperactivity disorder, combined type: Secondary | ICD-10-CM

## 2023-03-11 DIAGNOSIS — F3342 Major depressive disorder, recurrent, in full remission: Secondary | ICD-10-CM | POA: Diagnosis not present

## 2023-03-11 DIAGNOSIS — G4700 Insomnia, unspecified: Secondary | ICD-10-CM | POA: Diagnosis not present

## 2023-03-11 DIAGNOSIS — Z634 Disappearance and death of family member: Secondary | ICD-10-CM

## 2023-03-11 MED ORDER — ZOLPIDEM TARTRATE 5 MG PO TABS: 2.5000 mg | ORAL_TABLET | Freq: Every evening | ORAL | 0 refills | Status: DC | PRN

## 2023-03-11 MED ORDER — AMPHETAMINE-DEXTROAMPHET ER 20 MG PO CP24
20.0000 mg | ORAL_CAPSULE | Freq: Every morning | ORAL | 0 refills | Status: DC
Start: 2023-03-11 — End: 2023-06-29

## 2023-03-11 MED ORDER — AMPHETAMINE-DEXTROAMPHETAMINE 10 MG PO TABS: 10.0000 mg | ORAL_TABLET | Freq: Every day | ORAL | 0 refills | Status: DC

## 2023-03-11 NOTE — Progress Notes (Signed)
BH MD OP Progress Note  03/11/2023 3:43 PM Caitlyn Harris  MRN:  [id number]  Chief Complaint:  Chief Complaint  Patient presents with   Follow-up   Depression   Anxiety   ADD   Medication Refill   HPI: Caitlyn Harris is a 42 year old Caucasian female, married, employed, lives in [place], has a history of MDD, GAD, ADHD, IBS, migraine headaches, hysterectomy was evaluated in office today.  The patient presents with ongoing sleep disturbances and difficulty focusing. She reports feeling tired all the time, despite frequent napping during the day. The patient's sleep is frequently interrupted by environmental noises and changes in her husband's breathing patterns. She also reports difficulty falling asleep and staying asleep. The patient's sleep disturbances have been a long-standing issue, predating her current living situation and pet ownership.  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 She reports struggling to stay on top of her responsibilities and finding it hard to concentrate, especially in the afternoons. The patient's work stress is compounded by a lack of adequate breaks and a high volume of paperwork.  The patient also reports experiencing symptoms suggestive of perimenopause and is currently on an antibiotic for bacterial vaginosis. Despite these challenges, the patient has been making efforts to cope with her grief over her mother's passing, including creating activities to bring her family together during the holiday season. She has also noticed signs that she interprets as her mother's presence, which provides her with comfort.  Patient currently denies any suicidality, homicidality or perceptual disturbances.  Patient is currently compliant on medications like sertraline, Adderall, hydroxyzine denies side effects.  Visit Diagnosis:    ICD-10-CM   1. Recurrent major  depressive disorder, in full remission (HCC)  F33.42 zolpidem (AMBIEN) 5 MG tablet    2. GAD (generalized anxiety disorder)  F41.1     3. Attention deficit hyperactivity disorder (ADHD), combined type  F90.2 amphetamine-dextroamphetamine (ADDERALL XR) 20 MG 24 hr capsule    amphetamine-dextroamphetamine (ADDERALL) 10 MG tablet    4. Insomnia, unspecified type  G47.00     5. Bereavement  Z63.4       Past Psychiatric History: I have reviewed past psychiatric history from progress note on 11/15/2021.  Past trials of medications like Celexa, Ambien, ADHD medications like stimulants-does not remember all the names.  Vyvanse recently tried.  Patient had ADHD testing-per Dr. Trevor Barker-01/29/2022-patient meets criteria for ADHD combined type. Patient completed MH IOP in November 2023.  Past Medical History:  Past Medical History:  Diagnosis Date   Anxiety    Depression    Endometriosis    GERD (gastroesophageal reflux disease)    OCC   History of PCOS    Hyperlipidemia    Migraine    Seizures (HCC)    FEBRILE AS A BABY    Past Surgical History:  Procedure Laterality Date   ABDOMINAL HYSTERECTOMY     CERVICAL BIOPSY  W/ LOOP ELECTRODE EXCISION     COLONOSCOPY WITH PROPOFOL N/A 01/16/2022   Procedure: COLONOSCOPY WITH PROPOFOL;  Surgeon: Wyline Mood, MD;  Location: [facility] ENDOSCOPY;  Service: Gastroenterology;  Laterality: N/A;   ECTOPIC PREGNANCY SURGERY     endometirosis     ESOPHAGOGASTRODUODENOSCOPY (EGD) WITH PROPOFOL N/A 01/16/2022   Procedure: ESOPHAGOGASTRODUODENOSCOPY (EGD) WITH PROPOFOL;  Surgeon: Wyline Mood, MD;  Location[facility] ENDOSCOPY;  Service: Gastroenterology;  Laterality: N/A;   HAND SURGERY     THUMB SURGERY  LAPAROSCOPIC OVARIAN CYSTECTOMY Left 03/02/2018   Procedure: LAPAROSCOPIC RIGHT OVARIAN CYSTECTOMY;  Surgeon: Nadara Mustard, MD;  Location: [facility] ORS;  Service: Gynecology;  Laterality: Left;   LAPAROSCOPIC OVARIAN CYSTECTOMY Left 08/30/2019   Procedure:  LAPAROSCOPIC OVARIAN CYSTECTOMY;  Surgeon: Nadara Mustard, MD;  Location: [facility] ORS;  Service: Gynecology;  Laterality: Left;   LEEP     LEEP     PERINEOPLASTY N/A 03/02/2018   Procedure: PERINEORRHAPHY;  Surgeon: Nadara Mustard, MD;  Location: [facility] ORS;  Service: Gynecology;  Laterality: N/A;   RECTOCELE REPAIR N/A 03/02/2018   Procedure: POSTERIOR REPAIR (RECTOCELE);  Surgeon: Nadara Mustard, MD;  Location: [facility] ORS;  Service: Gynecology;  Laterality: N/A;   VENTRAL HERNIA REPAIR N/A 03/19/2017   Procedure: HERNIA REPAIR VENTRAL ADULT;  Surgeon: Lattie Haw, MD;  Location: [facility] ORS;  Service: General;  Laterality: N/A;    Family Psychiatric History: I have reviewed family psychiatric history from progress note on 11/15/2021.  Family History:  Family History  Problem Relation Age of Onset   Hypertension Mother    Diabetes Mother    CVA Mother    Chronic Renal Failure Mother    Renal cancer Father    Hypertension Father    Post-traumatic stress disorder Brother    Drug abuse Maternal Uncle    Alcohol abuse Maternal Uncle    Colon cancer Paternal Aunt    Bipolar disorder Half-Sister    Bipolar disorder Niece    Suicidality Other     Social History: I have reviewed social history from progress note on 11/15/2021. Social History   Socioeconomic History   Marital status: Married    Spouse name: Not on file   Number of children: 2   Years of education: Not on file   Highest education level: Master's degree (e.g., MA, MS, MEng, MEd, MSW, MBA)  Occupational History   Not on file  Tobacco Use   Smoking status: Former    Current packs/day: 0.00    Types: Cigarettes    Start date: 03/12/2002    Quit date: 03/12/2009    Years since quitting: 14.0   Smokeless tobacco: Never   Tobacco comments:    1 PACK EVERY 2 WEEKS  Vaping Use   Vaping status: Never Used  Substance and Sexual Activity   Alcohol use: Not Currently    Comment: RARE   Drug use: No   Sexual  activity: Yes  Other Topics Concern   Not on file  Social History Narrative   Right handed   Caffeine prn   Two story home   Lives with husband and two sons and mom   Currently Occcupational therapist   Social Determinants of Health   Financial Resource Strain: Low Risk  (03/10/2023)   Overall Financial Resource Strain (CARDIA)    Difficulty of Paying Living Expenses: Not hard at all  Food Insecurity: No Food Insecurity (03/10/2023)   Hunger Vital Sign    Worried About Running Out of Food in the Last Year: Never true    Ran Out of Food in the Last Year: Never true  Transportation Needs: No Transportation Needs (03/10/2023)   PRAPARE - Administrator, Civil Service (Medical): No    Lack of Transportation (Non-Medical): No  Physical Activity: Unknown (03/10/2023)   Exercise Vital Sign    Days of Exercise per Week: 0 days    Minutes of Exercise per Session: Not on file  Stress: Stress Concern Present (03/10/2023)  [employer] of Occupational Health - Occupational Stress Questionnaire    Feeling of Stress : To some extent  Social Connections: Moderately Isolated (03/10/2023)   Social Connection and Isolation Panel [NHANES]    Frequency of Communication with Friends and Family: Once a week    Frequency of Social Gatherings with Friends and Family: Once a week    Attends Religious Services: 1 to 4 times per year    Active Member of [employer] or Organizations: No    Attends Engineer, structural: Not on file    Marital Status: Married    Allergies:  Allergies  Allergen Reactions   Tape     Low sensitivity to tape, ok with Tegaderm and paper tape     Metabolic Disorder Labs: Lab Results  Component Value Date   HGBA1C 5.2 03/10/2023   Lab Results  Component Value Date   PROLACTIN 13.3 12/21/2017   Lab Results  Component Value Date   CHOL 170 03/10/2023   TRIG (H) 03/10/2023    593.0 Triglyceride is over 400; calculations on Lipids are invalid.   HDL  31.20 (L) 03/10/2023   CHOLHDL 5 03/10/2023   VLDL 38.6 06/07/2018   LDLCALC 75 06/07/2018   Lab Results  Component Value Date   TSH 1.52 03/10/2023   TSH 1.57 06/26/2021    Therapeutic Level Labs: No results found for: "LITHIUM" No results found for: "VALPROATE" No results found for: "CBMZ"  Current Medications: Current Outpatient Medications  Medication Sig Dispense Refill   acetaminophen (TYLENOL) 500 MG tablet Take 1,000 mg by mouth every 6 (six) hours as needed for moderate pain or headache.     amphetamine-dextroamphetamine (ADDERALL XR) 20 MG 24 hr capsule Take 1 capsule (20 mg total) by mouth in the morning. 30 capsule 0   amphetamine-dextroamphetamine (ADDERALL XR) 20 MG 24 hr capsule Take 1 capsule (20 mg total) by mouth daily. 30 capsule 0   amphetamine-dextroamphetamine (ADDERALL) 10 MG tablet Take 1 tablet (10 mg total) by mouth daily after lunch. 30 tablet 0   clonazePAM (KLONOPIN) 0.5 MG tablet Take 0.5-1 tablets (0.25-0.5 mg total) by mouth daily as needed for anxiety. Please limit use 10 tablet 0   Fremanezumab-vfrm (AJOVY) 225 MG/1.5ML SOAJ Inject 225 mg into the skin every 28 (twenty-eight) days. 1.68 mL 11   hydrOXYzine (VISTARIL) 25 MG capsule Take 1-2 capsules (25-50 mg total) by mouth at bedtime as needed. For sleep and anxiety 180 capsule 0   ibuprofen (ADVIL) 400 MG tablet Take 400 mg by mouth every 6 (six) hours as needed.     metroNIDAZOLE (FLAGYL) 500 MG tablet Take 1 tablet (500 mg total) by mouth 2 (two) times daily for 7 days. 14 tablet 0   omeprazole (PRILOSEC) 20 MG capsule TAKE 1 CAPSULE (20 MG TOTAL) BY MOUTH 2 (TWO) TIMES DAILY BEFORE A MEAL. 180 capsule 3   ondansetron (ZOFRAN-ODT) 8 MG disintegrating tablet Take 1 tablet (8 mg total) by mouth every 8 (eight) hours as needed for nausea or vomiting. 20 tablet 0   sertraline (ZOLOFT) 50 MG tablet Take 1.5 tablets (75 mg total) by mouth daily after supper. 135 tablet 0   SUMAtriptan (IMITREX) 100 MG  tablet MAY REPEAT IN 2 HOURS IF HEADACHE PERSISTS OR RECURS. 30 tablet 3   tiZANidine (ZANAFLEX) 4 MG capsule TAKE 1 CAPSULE BY MOUTH 3 TIMES DAILY AS NEEDED FOR MUSCLE SPASMS. 30 capsule 3   topiramate (TOPAMAX) 100 MG tablet TAKE 1 TABLET BY MOUTH TWICE  A DAY 180 tablet 0   Ubrogepant (UBRELVY) 100 MG TABS Take 1 tablet (100 mg total) by mouth as needed. May repeat after 2 hours.  Maximum 2 tablets in 24 hours. 16 tablet 11   zolpidem (AMBIEN) 5 MG tablet Take 0.5-1 tablets (2.5-5 mg total) by mouth at bedtime as needed for sleep. 30 tablet 0   amphetamine-dextroamphetamine (ADDERALL XR) 20 MG 24 hr capsule Take 1 capsule (20 mg total) by mouth in the morning. 30 capsule 0   cholecalciferol (VITAMIN D3) 25 MCG (1000 UNIT) tablet Take 5,000 Units by mouth daily.     No current facility-administered medications for this visit.     Musculoskeletal: Strength & Muscle Tone: within normal limits Gait & Station: normal Patient leans: N/A  Psychiatric Specialty Exam: Review of Systems  Psychiatric/Behavioral:  Positive for decreased concentration and sleep disturbance.     Blood pressure (!) 141/92, pulse (!) 108, temperature (!) 95.9 F (35.5 C), temperature source Skin, height 6\' 1"  (1.854 m), weight 250 lb (113.4 kg).Body mass index is 32.98 kg/m.  Reviewed and discussed most recent blood pressure dated 03/10/2023-Dr. Tower-120/76.  General Appearance: Casual  Eye Contact:  Fair  Speech:  Clear and Coherent  Volume:  Normal  Mood:   grieving  Affect:  Congruent  Thought Process:  Goal Directed and Descriptions of Associations: Intact  Orientation:  Full (Time, Place, and Person)  Thought Content: Logical   Suicidal Thoughts:  No  Homicidal Thoughts:  No  Memory:  Immediate;   Fair Recent;   Fair Remote;   Fair  Judgement:  Fair  Insight:  Fair  Psychomotor Activity:  Normal  Concentration:  Concentration: Fair and Attention Span: Fair  Recall:  [employer] of Knowledge: Fair   Language: Fair  Akathisia:  No  Handed:  Right  AIMS (if indicated): not done  Assets:  Manufacturing systems engineer Desire for Improvement Housing Social Support Transportation  ADL's:  Intact  Cognition: WNL  Sleep:   restless   Screenings: GAD-7    Loss adjuster, chartered Office Visit from 03/11/2023 in [facility] Office Visit from 12/25/2022 in [facility] Office Visit from 07/23/2022 in [facility] Office Visit from 01/21/2022 in [facility] Office Visit from 12/11/2021 in [facility]  Total GAD-7 Score 8 13 8 14 7       PHQ2-9    Flowsheet Row Office Visit from 03/11/2023 in [facility] Office Visit from 03/10/2023 in [facility] Office Visit from 12/25/2022 in [facility] Video Visit from 10/15/2022 in [facility] Office Visit from 07/23/2022 in [facility]  PHQ-2 Total Score 3 4 6 1 3   PHQ-9 Total Score 18 18 21 9 16       Flowsheet Row Office Visit from 03/11/2023 in [facility] Office Visit from 12/25/2022 in [facility] Video Visit from 10/15/2022 in [facility]  C-SSRS RISK CATEGORY No Risk No Risk No Risk        Assessment and Plan: Caitlyn Harris is a 42 year old Caucasian female, currently employed, married, lives in [place], has a history of depression, anxiety, ADHD, multiple medical problems including migraine headaches, currently struggling with concentration, sleep problems although coping with grief better  than before, plan as noted below.    Depression-in  remission Chronic depression with fatigue and concentration difficulties. No current counselor. Discussed the importance of finding a counselor. - Sertraline 75 mg p.o. daily. - Encourage finding a counselor - Continue current medications  Generalized anxiety disorder-unstable Chronic anxiety affecting concentration and workload management. Discussed stress management techniques. - Continue current medications, we will consider increasing the dosage of sertraline in the future - Continue clonazepam 0.25-0.5 mg as needed for severe anxiety attacks. - Continue hydroxyzine 25-50 mg at bedtime as needed. - Encourage stress management techniques  Attention Deficit Hyperactivity Disorder (ADHD)-unstable ADHD with afternoon focus difficulties. Current medication: Continue Adderall XR 20 mg daily. Discussed adding Adderall IR 10 mg for afternoon use and potential sleep disturbances. - Start Adderall IR 10 mg for afternoon use - Advise to monitor for sleep disturbances - Reviewed [place] PMP AWARxE  Insomnia -unstable Chronic insomnia with frequent awakenings. Discussed Ambien 5 mg, option to cut to 2.5 mg, habit-forming nature, and sleep hygiene. - Prescribe Ambien 5 mg with option to cut to 2.5 mg - Advise to avoid naps and improve sleep hygiene  Follow-up - Schedule follow-up appointment in 8 weeks - Send message through My Chart 7 days prior to running out of medications for refills.  Collaboration of Care: Collaboration of Care: Patient to monitor blood pressure and reach out to primary care provider as needed.  I have reviewed most recent blood pressure-dated 03/11/2023-120/76.  Will continue to monitor this closely.  Patient/Guardian was advised Release of Information must be obtained prior to any record release in order to collaborate their care with an outside provider. Patient/Guardian was advised if they have not already done so to contact the registration department to sign all necessary  forms in order for [place] to release information regarding their care.   Consent: Patient/Guardian gives verbal consent for treatment and assignment of benefits for services provided during this visit. Patient/Guardian expressed understanding and agreed to proceed.   This note was generated in part or whole with voice recognition software. Voice recognition is usually quite accurate but there are transcription errors that can and very often do occur. I apologize for any typographical errors that were not detected and corrected.    Jomarie Longs, MD 03/13/2023, 7:35 AM

## 2023-03-11 NOTE — Addendum Note (Signed)
Addended by: Roxy Manns A on: 03/11/2023 08:03 PM   Modules accepted: Orders

## 2023-03-11 NOTE — Patient Instructions (Signed)
Zolpidem Tablets What is this medication? ZOLPIDEM (zole PI dem) treats insomnia. It helps you get to sleep faster and stay asleep throughout the night. It is often used for a short period of time. This medicine may be used for other purposes; ask your health care provider or pharmacist if you have questions. COMMON BRAND NAME(S): Ambien What should I tell my care team before I take this medication? They need to know if you have any of these conditions: Depression Frequently drink alcohol Liver disease Lung or breathing disease Myasthenia gravis Sleep apnea Substance use disorder Suicidal thoughts, plans, or attempt by you or a family member Unusual sleep behaviors or activities you do not remember An unusual or allergic reaction to zolpidem, other medications, foods, dyes, or preservatives Pregnant or trying to get pregnant Breastfeeding How should I use this medication? Take this medication by mouth with water. Take it as directed on the prescription label. It is better to take this medication on an empty stomach and only when you are ready for bed. Do not take your medication more often than directed. If you have been taking this medication for several weeks and suddenly stop taking it, you may get unpleasant withdrawal symptoms. Your care team may want to gradually reduce the dose. Do not stop taking this medication on your own. Always follow your care team's advice. A special MedGuide will be given to you by the pharmacist with each prescription and refill. Be sure to read this information carefully each time. Talk to your care team about the use of this medication in children. Special care may be needed. Overdosage: If you think you have taken too much of this medicine contact a poison control center or emergency room at once. NOTE: This medicine is only for you. Do not share this medicine with others. What if I miss a dose? This does not apply. This medication should only be taken  immediately before going to sleep. Do not take double or extra doses. What may interact with this medication? Alcohol Antihistamines for allergy, cough, and cold Certain medications for anxiety or sleep Certain medications for depression, such as amitriptyline, fluoxetine, sertraline Certain medications for fungal infections, such as ketoconazole and itraconazole Certain medications for seizures, such as phenobarbital, primidone Ciprofloxacin Dietary supplements for sleep, such as valerian or kava kava General anesthetics, such as halothane, isoflurane, methoxyflurane, propofol Local anesthetics, such as lidocaine, pramoxine, tetracaine Medications that relax muscles for surgery Opioid medications for pain Phenothiazines, such as chlorpromazine, mesoridazine, prochlorperazine, thioridazine Rifampin This list may not describe all possible interactions. Give your health care provider a list of all the medicines, herbs, non-prescription drugs, or dietary supplements you use. Also tell them if you smoke, drink alcohol, or use illegal drugs. Some items may interact with your medicine. What should I watch for while using this medication? Visit your care team for regular checks on your progress. Keep a regular sleep schedule by going to bed at about the same time each night. Avoid caffeine-containing drinks in the evening hours. When sleep medications are used every night for more than a few weeks, they may stop working. Talk to your care team if you still have trouble sleeping. You may do unusual sleep behaviors or activities you do not remember the day after taking this medication. Activities include driving, making or eating food, talking on the phone, sexual activity, or sleep walking. Stop taking this medication and call your care team right away if you find out you have done activities  like this. Plan to go to bed and stay in bed for a full night (7 to 8 hours) after you take this medication. You  may still be drowsy the morning after taking this medication. This medication may affect your coordination, reaction time, or judgment. Do not drive or operate machinery until you know how this medication affects you. Sit up or stand slowly to reduce the risk of dizzy or fainting spells. If you or your family notice any changes in your behavior, such as new or worsening depression, thoughts of harming yourself, anxiety, other unusual or disturbing thoughts, or memory loss, call your care team right away. After you stop taking this medication, you may have trouble falling asleep. This is called rebound insomnia. This problem usually goes away on its own after 1 or 2 nights. What side effects may I notice from receiving this medication? Side effects that you should report to your care team as soon as possible: Allergic reactions--skin rash, itching, hives, swelling of the face, lips, tongue, or throat Change in vision such as blurry vision, seeing halos around lights, vision loss CNS depression--slow or shallow breathing, shortness of breath, feeling faint, dizziness, confusion, difficulty staying awake Mood and behavior changes--anxiety, nervousness, confusion, hallucinations, irritability, hostility, thoughts of suicide or self-harm, worsening mood, feelings of depression Unusual sleep behaviors or activities you do not remember such as driving, eating, or sexual activity Side effects that usually do not require medical attention (report to your care team if they continue or are bothersome): Diarrhea Dizziness Drowsiness the day after use Headache This list may not describe all possible side effects. Call your doctor for medical advice about side effects. You may report side effects to FDA at 1-800-FDA-1088. Where should I keep my medication? Keep out of the reach of children and pets. This medication can be abused. Keep your medication in a safe place to protect it from theft. Do not share this  medication with anyone. Selling or giving away this medication is dangerous and against the law. Store at room temperature between 20 and 25 degrees C (68 and 77 degrees F). This medication may cause accidental overdose and death if taken by other adults, children, or pets. Mix any unused medication with a substance like cat litter or coffee grounds. Then throw the medication away in a sealed container like a sealed bag or a coffee can with a lid. Do not use the medication after the expiration date. NOTE: This sheet is a summary. It may not cover all possible information. If you have questions about this medicine, talk to your doctor, pharmacist, or health care provider.  2024 Elsevier/Gold Standard (2022-03-23 00:00:00)

## 2023-03-12 ENCOUNTER — Telehealth: Payer: Self-pay | Admitting: *Deleted

## 2023-03-12 NOTE — Telephone Encounter (Signed)
Spoke to pt, scheduled labs for 05/12/23

## 2023-03-12 NOTE — Telephone Encounter (Signed)
Pt has already viewed lab and per Dr. Milinda Antis:  Re check fasting labs in 2 months for lipids and vitamin D   **please schedule lab appt. Thanks**

## 2023-04-11 ENCOUNTER — Other Ambulatory Visit: Payer: Self-pay | Admitting: Family Medicine

## 2023-04-11 DIAGNOSIS — G43709 Chronic migraine without aura, not intractable, without status migrainosus: Secondary | ICD-10-CM

## 2023-04-13 NOTE — Telephone Encounter (Signed)
 Last OV was a f/u on 03/10/23 Last filled on 01/12/23 #180 tabs/ 0 refills

## 2023-04-23 ENCOUNTER — Other Ambulatory Visit: Payer: Self-pay | Admitting: Psychiatry

## 2023-04-23 DIAGNOSIS — F411 Generalized anxiety disorder: Secondary | ICD-10-CM

## 2023-05-06 ENCOUNTER — Ambulatory Visit: Payer: 59 | Admitting: Psychiatry

## 2023-05-06 ENCOUNTER — Encounter: Payer: Self-pay | Admitting: Psychiatry

## 2023-05-06 ENCOUNTER — Telehealth: Payer: Self-pay

## 2023-05-06 VITALS — BP 116/82 | HR 82 | Temp 96.9°F | Ht 73.0 in | Wt 254.8 lb

## 2023-05-06 DIAGNOSIS — F411 Generalized anxiety disorder: Secondary | ICD-10-CM

## 2023-05-06 DIAGNOSIS — F902 Attention-deficit hyperactivity disorder, combined type: Secondary | ICD-10-CM | POA: Diagnosis not present

## 2023-05-06 DIAGNOSIS — F3342 Major depressive disorder, recurrent, in full remission: Secondary | ICD-10-CM

## 2023-05-06 DIAGNOSIS — Z634 Disappearance and death of family member: Secondary | ICD-10-CM

## 2023-05-06 DIAGNOSIS — G4700 Insomnia, unspecified: Secondary | ICD-10-CM

## 2023-05-06 NOTE — Telephone Encounter (Signed)
received fax that a prior auth was needed for the amphetamine dextroamphetamine 10mg 

## 2023-05-06 NOTE — Progress Notes (Unsigned)
BH MD OP Progress Note  05/06/2023 3:28 PM Caitlyn Harris  MRN:  [id number]  Chief Complaint:  Chief Complaint  Patient presents with   Follow-up   Anxiety   Depression   ADD   Medication Refill   HPI: Caitlyn Harris is a 43 year old Caucasian female, married, employed, lives in [place], has a history of MDD, GAD, ADHD, IBS, migraine headaches, hysterectomy was evaluated in office today.  The patient is a 43 year old female who presents for a follow-up on her psychiatric medications and sleep issues.  She continues to experience sleep disturbances despite her depression being in remission. She takes Ambien, halving the dose, which allows her to sleep for at least three hours. She sometimes wakes up around 1 or 2 AM but can fall back asleep without taking another dose. Her bedtime is usually 9:30 PM, and she falls asleep by 10 PM, achieving a few good hours of sleep. She also takes sertraline 75 mg, clonazepam as needed, and hydroxyzine.  She has encountered issues with her Adderall prescription. Her immediate release Adderall was not filled due to a prior authorization issue, leading to difficulties at work.  This was verified by calling her [facility]. She picked up the extended release 20 mg on December 4th but has not been taking it consistently.  She hence continues to struggle with attention and focus problems some days.  Her depression is currently in remission. She feels better and had a positive experience over the holidays, which was beneficial for her emotional well-being. She continues to take sertraline 75 mg daily.  She experiences anxiety, which occasionally affects her sleep. During periods of high anxiety, she has used a full dose of Ambien. She is not currently seeing a therapist, which could be beneficial for managing her anxiety and grief.  She has not started therapy yet but feels she is managing her grief well, having moments but generally feeling better than  expected. Her sister-in-law who recently visited her noted that she is doing better than expected.  She is taking it 1 day at a time.  Patient currently denies any suicidality, homicidality or perceptual disturbances.  Visit Diagnosis:    ICD-10-CM   1. Recurrent major depressive disorder, in full remission (HCC)  F33.42     2. GAD (generalized anxiety disorder)  F41.1     3. Attention deficit hyperactivity disorder (ADHD), combined type  F90.2     4. Insomnia, unspecified type  G47.00     5. Bereavement  Z63.4       Past Psychiatric History: I have reviewed past psychiatric history from progress note on 11/15/2021.  Past trials of medications like Celexa, Ambien, ADHD medications like stimulants-Vyvanse, several others.  Patient had ADHD testing-per Dr. Trevor Barker-01/29/2022-patient meets criteria for ADHD combined type.  Patient completed MH IOP in November 2023.  Past Medical History:  Past Medical History:  Diagnosis Date   Anxiety    Depression    Endometriosis    GERD (gastroesophageal reflux disease)    OCC   History of PCOS    Hyperlipidemia    Migraine    Seizures (HCC)    FEBRILE AS A BABY    Past Surgical History:  Procedure Laterality Date   ABDOMINAL HYSTERECTOMY     CERVICAL BIOPSY  W/ LOOP ELECTRODE EXCISION     COLONOSCOPY WITH PROPOFOL N/A 01/16/2022   Procedure: COLONOSCOPY WITH PROPOFOL;  Surgeon: Wyline Mood, MD;  Location: [facility] ENDOSCOPY;  Service:  Gastroenterology;  Laterality: N/A;   ECTOPIC PREGNANCY SURGERY     endometirosis     ESOPHAGOGASTRODUODENOSCOPY (EGD) WITH PROPOFOL N/A 01/16/2022   Procedure: ESOPHAGOGASTRODUODENOSCOPY (EGD) WITH PROPOFOL;  Surgeon: Wyline Mood, MD;  Location: [facility] ENDOSCOPY;  Service: Gastroenterology;  Laterality: N/A;   HAND SURGERY     THUMB SURGERY   LAPAROSCOPIC OVARIAN CYSTECTOMY Left 03/02/2018   Procedure: LAPAROSCOPIC RIGHT OVARIAN CYSTECTOMY;  Surgeon: Nadara Mustard, MD;  Location: [facility] ORS;  Service:  Gynecology;  Laterality: Left;   LAPAROSCOPIC OVARIAN CYSTECTOMY Left 08/30/2019   Procedure: LAPAROSCOPIC OVARIAN CYSTECTOMY;  Surgeon: Nadara Mustard, MD;  Location: [facility] ORS;  Service: Gynecology;  Laterality: Left;   LEEP     LEEP     PERINEOPLASTY N/A 03/02/2018   Procedure: PERINEORRHAPHY;  Surgeon: Nadara Mustard, MD;  Location: [facility] ORS;  Service: Gynecology;  Laterality: N/A;   RECTOCELE REPAIR N/A 03/02/2018   Procedure: POSTERIOR REPAIR (RECTOCELE);  Surgeon: Nadara Mustard, MD;  Location: [facility] ORS;  Service: Gynecology;  Laterality: N/A;   VENTRAL HERNIA REPAIR N/A 03/19/2017   Procedure: HERNIA REPAIR VENTRAL ADULT;  Surgeon: Lattie Haw, MD;  Location: [facility] ORS;  Service: General;  Laterality: N/A;    Family Psychiatric History: I have reviewed family psychiatric history from progress note on 11/15/2021.  Family History:  Family History  Problem Relation Age of Onset   Hypertension Mother    Diabetes Mother    CVA Mother    Chronic Renal Failure Mother    Renal cancer Father    Hypertension Father    Post-traumatic stress disorder Brother    Drug abuse Maternal Uncle    Alcohol abuse Maternal Uncle    Colon cancer Paternal Aunt    Bipolar disorder Half-Sister    Bipolar disorder Niece    Suicidality Other     Social History: I have reviewed social history from progress note on 11/15/2021. Social History   Socioeconomic History   Marital status: Married    Spouse name: Not on file   Number of children: 2   Years of education: Not on file   Highest education level: Master's degree (e.g., MA, MS, MEng, MEd, MSW, MBA)  Occupational History   Not on file  Tobacco Use   Smoking status: Former    Current packs/day: 0.00    Types: Cigarettes    Start date: 03/12/2002    Quit date: 03/12/2009    Years since quitting: 14.1   Smokeless tobacco: Never   Tobacco comments:    1 PACK EVERY 2 WEEKS  Vaping Use   Vaping status: Never Used  Substance and  Sexual Activity   Alcohol use: Not Currently    Comment: RARE   Drug use: No   Sexual activity: Yes  Other Topics Concern   Not on file  Social History Narrative   Right handed   Caffeine prn   Two story home   Lives with husband and two sons and mom   Currently Occcupational therapist   Social Drivers of Health   Financial Resource Strain: Low Risk  (03/10/2023)   Overall Financial Resource Strain (CARDIA)    Difficulty of Paying Living Expenses: Not hard at all  Food Insecurity: No Food Insecurity (03/10/2023)   Hunger Vital Sign    Worried About Running Out of Food in the Last Year: Never true    Ran Out of Food in the Last Year: Never true  Transportation Needs: No Transportation Needs (03/10/2023)  PRAPARE - Administrator, Civil Service (Medical): No    Lack of Transportation (Non-Medical): No  Physical Activity: Unknown (03/10/2023)   Exercise Vital Sign    Days of Exercise per Week: 0 days    Minutes of Exercise per Session: Not on file  Stress: Stress Concern Present (03/10/2023)   [employer] of Occupational Health - Occupational Stress Questionnaire    Feeling of Stress : To some extent  Social Connections: Moderately Isolated (03/10/2023)   Social Connection and Isolation Panel [NHANES]    Frequency of Communication with Friends and Family: Once a week    Frequency of Social Gatherings with Friends and Family: Once a week    Attends Religious Services: 1 to 4 times per year    Active Member of [employer] or Organizations: No    Attends Engineer, structural: Not on file    Marital Status: Married    Allergies:  Allergies  Allergen Reactions   Tape     Low sensitivity to tape, ok with Tegaderm and paper tape     Metabolic Disorder Labs: Lab Results  Component Value Date   HGBA1C 5.2 03/10/2023   Lab Results  Component Value Date   PROLACTIN 13.3 12/21/2017   Lab Results  Component Value Date   CHOL 170 03/10/2023   TRIG (H)  03/10/2023    593.0 Triglyceride is over 400; calculations on Lipids are invalid.   HDL 31.20 (L) 03/10/2023   CHOLHDL 5 03/10/2023   VLDL 38.6 06/07/2018   LDLCALC 75 06/07/2018   Lab Results  Component Value Date   TSH 1.52 03/10/2023   TSH 1.57 06/26/2021    Therapeutic Level Labs: No results found for: "LITHIUM" No results found for: "VALPROATE" No results found for: "CBMZ"  Current Medications: Current Outpatient Medications  Medication Sig Dispense Refill   acetaminophen (TYLENOL) 500 MG tablet Take 1,000 mg by mouth every 6 (six) hours as needed for moderate pain or headache.     amphetamine-dextroamphetamine (ADDERALL XR) 20 MG 24 hr capsule Take 1 capsule (20 mg total) by mouth in the morning. 30 capsule 0   amphetamine-dextroamphetamine (ADDERALL XR) 20 MG 24 hr capsule Take 1 capsule (20 mg total) by mouth daily. 30 capsule 0   amphetamine-dextroamphetamine (ADDERALL XR) 20 MG 24 hr capsule Take 1 capsule (20 mg total) by mouth in the morning. 30 capsule 0   amphetamine-dextroamphetamine (ADDERALL) 10 MG tablet Take 1 tablet (10 mg total) by mouth daily after lunch. 30 tablet 0   cholecalciferol (VITAMIN D3) 25 MCG (1000 UNIT) tablet Take 5,000 Units by mouth daily.     clonazePAM (KLONOPIN) 0.5 MG tablet Take 0.5-1 tablets (0.25-0.5 mg total) by mouth daily as needed for anxiety. Please limit use 10 tablet 0   Fremanezumab-vfrm (AJOVY) 225 MG/1.5ML SOAJ Inject 225 mg into the skin every 28 (twenty-eight) days. 1.68 mL 11   hydrOXYzine (VISTARIL) 25 MG capsule Take 1-2 capsules (25-50 mg total) by mouth at bedtime as needed. For sleep and anxiety 180 capsule 0   ibuprofen (ADVIL) 400 MG tablet Take 400 mg by mouth every 6 (six) hours as needed.     omeprazole (PRILOSEC) 20 MG capsule TAKE 1 CAPSULE (20 MG TOTAL) BY MOUTH 2 (TWO) TIMES DAILY BEFORE A MEAL. 180 capsule 3   ondansetron (ZOFRAN-ODT) 8 MG disintegrating tablet Take 1 tablet (8 mg total) by mouth every 8 (eight)  hours as needed for nausea or vomiting. 20 tablet 0  sertraline (ZOLOFT) 50 MG tablet TAKE 1.5 TABLETS (75 MG TOTAL) BY MOUTH DAILY AFTER SUPPER. 135 tablet 0   SUMAtriptan (IMITREX) 100 MG tablet MAY REPEAT IN 2 HOURS IF HEADACHE PERSISTS OR RECURS. 30 tablet 3   tiZANidine (ZANAFLEX) 4 MG capsule TAKE 1 CAPSULE BY MOUTH 3 TIMES DAILY AS NEEDED FOR MUSCLE SPASMS. 30 capsule 3   topiramate (TOPAMAX) 100 MG tablet TAKE 1 TABLET BY MOUTH TWICE A DAY 180 tablet 1   Ubrogepant (UBRELVY) 100 MG TABS Take 1 tablet (100 mg total) by mouth as needed. May repeat after 2 hours.  Maximum 2 tablets in 24 hours. 16 tablet 11   zolpidem (AMBIEN) 5 MG tablet Take 0.5-1 tablets (2.5-5 mg total) by mouth at bedtime as needed for sleep. 30 tablet 0   No current facility-administered medications for this visit.     Musculoskeletal: Strength & Muscle Tone: within normal limits Gait & Station: normal Patient leans: N/A  Psychiatric Specialty Exam: Review of Systems  Psychiatric/Behavioral:  Positive for sleep disturbance.     Blood pressure 116/82, pulse 82, temperature (!) 96.9 F (36.1 C), temperature source Skin, height 6\' 1"  (1.854 m), weight 254 lb 12.8 oz (115.6 kg).Body mass index is 33.62 kg/m.  General Appearance: Casual  Eye Contact:  Good  Speech:  Clear and Coherent  Volume:  Normal  Mood:  Euthymic  Affect:  Appropriate  Thought Process:  Goal Directed and Descriptions of Associations: Intact  Orientation:  Full (Time, Place, and Person)  Thought Content: Logical   Suicidal Thoughts:  No  Homicidal Thoughts:  No  Memory:  Immediate;   Fair Recent;   Fair Remote;   Fair  Judgement:  Fair  Insight:  Fair  Psychomotor Activity:  Normal  Concentration:  Concentration: Fair and Attention Span: Fair  Recall:  [employer] of Knowledge: Fair  Language: Fair  Akathisia:  No  Handed:  Right  AIMS (if indicated): not done  Assets:  Communication Skills Desire for  Improvement Housing Intimacy Talents/Skills  ADL's:  Intact  Cognition: WNL  Sleep:   improving   Screenings: GAD-7    Loss adjuster, chartered Office Visit from 05/06/2023 in [facility] Office Visit from 03/11/2023 in [facility] Office Visit from 12/25/2022 in [facility] Office Visit from 07/23/2022 in [facility] Office Visit from 01/21/2022 in [facility]  Total GAD-7 Score 7 8 13 8 14       PHQ2-9    Flowsheet Row Office Visit from 05/06/2023 in [facility] Office Visit from 03/11/2023 in [facility] Office Visit from 03/10/2023 in [facility] Office Visit from 12/25/2022 in [facility] Video Visit from 10/15/2022 in [facility]  PHQ-2 Total Score 4 3 4 6 1   PHQ-9 Total Score 15 18 18 21 9       Flowsheet Row Office Visit from 05/06/2023 in [facility] Office Visit from 03/11/2023 in [facility] Office Visit from 12/25/2022 in [facility]  C-SSRS RISK CATEGORY No Risk No Risk No Risk        Assessment and Plan: Caitlyn Harris is a 43 year old Caucasian female, currently employed, married, lives in [place], has a history of depression, anxiety, ADHD  was evaluated in office today.  Patient is currently improving with regards to her mood symptoms and grief although continues to have ADHD symptoms, not consistent with medication regimen, discussed assessment and plan as noted below.  Depression in remission Depression in remission. Improved mood and positive experiences with family  over the holidays. Continues to manage sleep with medication. No new medications reported. - Continue sertraline 75 mg daily - Continue clonazepam 0.25 0.5 mg as needed - Continue hydroxyzine  Insomnia-improving Difficulty with sleep, often waking around 1-2 AM. Ambien provides at least 3 hours of solid sleep. Avoids taking medication too late to prevent daytime drowsiness. Discussed avoiding another dose if waking in the middle of the night. - Continue Ambien 2.5 to 5 mg as needed. - Reviewed [place] PMP AWARxE   Attention-Deficit/Hyperactivity Disorder (ADHD)-unstable Difficulty with focus and organization at work. Issues with medication refills due to insurance requiring prior authorization. Inconsistent Adderall use. Discussed increasing extended-release dosage if immediate release is not covered by insurance. - Request prior authorization for Adderall 10 mg immediate release, per pharmacist. - Pharmacist agreed to fax request to this office. - Continue Adderall 20 mg extended release in the morning, patient encouraged to take it regularly. - Monitor response to medication and adjust as needed  Grief-improving Improving but still has moments of difficulty. No current therapy sessions. Discussed the importance of therapy for coping with grief. - Schedule therapy visit - Discuss grief and coping strategies with therapist  Follow-up - Schedule follow-up appointment in 6-8 weeks - Consider virtual visit for next appointment.   Collaboration of Care: Collaboration of Care: Referral or follow-up with counselor/therapist AEB patient encouraged to establish care with therapist.  Patient/Guardian was advised Release of Information must be obtained prior to any record release in order to collaborate their care with an outside provider. Patient/Guardian was advised if they have not already done so to contact the registration department to sign all necessary forms in order for [place] to release  information regarding their care.   Consent: Patient/Guardian gives verbal consent for treatment and assignment of benefits for services provided during this visit. Patient/Guardian expressed understanding and agreed to proceed.   This note was generated in part or whole with voice recognition software. Voice recognition is usually quite accurate but there are transcription errors that can and very often do occur. I apologize for any typographical errors that were not detected and corrected.    Jomarie Longs, MD 05/06/2023, 3:28 PM

## 2023-05-06 NOTE — Telephone Encounter (Signed)
went online and submitted the prior auth - pending

## 2023-05-07 NOTE — Telephone Encounter (Signed)
received fax that prior auth was approved from 05-07-23 to 05-06-26

## 2023-05-12 ENCOUNTER — Encounter: Payer: Self-pay | Admitting: Family Medicine

## 2023-05-12 ENCOUNTER — Other Ambulatory Visit (INDEPENDENT_AMBULATORY_CARE_PROVIDER_SITE_OTHER): Payer: 59

## 2023-05-12 DIAGNOSIS — E559 Vitamin D deficiency, unspecified: Secondary | ICD-10-CM | POA: Diagnosis not present

## 2023-05-12 DIAGNOSIS — E781 Pure hyperglyceridemia: Secondary | ICD-10-CM

## 2023-05-12 LAB — VITAMIN D 25 HYDROXY (VIT D DEFICIENCY, FRACTURES): VITD: 11.8 ng/mL — ABNORMAL LOW (ref 30.00–100.00)

## 2023-05-12 LAB — LDL CHOLESTEROL, DIRECT: Direct LDL: 101 mg/dL

## 2023-05-12 LAB — LIPID PANEL
Cholesterol: 188 mg/dL (ref 0–200)
HDL: 35.4 mg/dL — ABNORMAL LOW (ref >39.00)
NonHDL: 152.5
Total CHOL/HDL Ratio: 5
Triglycerides: 405 mg/dL — ABNORMAL HIGH (ref 0.0–149.0)
VLDL: 81 mg/dL — ABNORMAL HIGH (ref 0.0–40.0)

## 2023-05-15 ENCOUNTER — Telehealth: Payer: Self-pay | Admitting: Psychiatry

## 2023-05-15 NOTE — Telephone Encounter (Addendum)
 Prior authorization was approved for Adderall.  Will have staff contact patient to clarify if she was able to obtain it from pharmacy.

## 2023-06-09 ENCOUNTER — Encounter: Payer: Self-pay | Admitting: *Deleted

## 2023-06-16 ENCOUNTER — Ambulatory Visit (INDEPENDENT_AMBULATORY_CARE_PROVIDER_SITE_OTHER): Payer: 59 | Admitting: Professional Counselor

## 2023-06-16 DIAGNOSIS — F332 Major depressive disorder, recurrent severe without psychotic features: Secondary | ICD-10-CM | POA: Diagnosis not present

## 2023-06-16 DIAGNOSIS — F411 Generalized anxiety disorder: Secondary | ICD-10-CM | POA: Diagnosis not present

## 2023-06-16 NOTE — Telephone Encounter (Signed)
 PA needed
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
## 2023-06-29 ENCOUNTER — Encounter: Payer: Self-pay | Admitting: Psychiatry

## 2023-06-29 ENCOUNTER — Telehealth (INDEPENDENT_AMBULATORY_CARE_PROVIDER_SITE_OTHER): Payer: Self-pay | Admitting: Psychiatry

## 2023-06-29 DIAGNOSIS — F411 Generalized anxiety disorder: Secondary | ICD-10-CM

## 2023-06-29 DIAGNOSIS — Z634 Disappearance and death of family member: Secondary | ICD-10-CM

## 2023-06-29 DIAGNOSIS — F330 Major depressive disorder, recurrent, mild: Secondary | ICD-10-CM

## 2023-06-29 DIAGNOSIS — G4700 Insomnia, unspecified: Secondary | ICD-10-CM | POA: Diagnosis not present

## 2023-06-29 DIAGNOSIS — F902 Attention-deficit hyperactivity disorder, combined type: Secondary | ICD-10-CM | POA: Diagnosis not present

## 2023-06-29 MED ORDER — SERTRALINE HCL 50 MG PO TABS
75.0000 mg | ORAL_TABLET | Freq: Every day | ORAL | 0 refills | Status: DC
Start: 2023-06-29 — End: 2023-10-19

## 2023-06-29 MED ORDER — AMPHETAMINE-DEXTROAMPHET ER 30 MG PO CP24: 30.0000 mg | ORAL_CAPSULE | ORAL | 0 refills | Status: DC

## 2023-06-29 NOTE — Progress Notes (Unsigned)
 Virtual Visit via Video Note  I connected with Caitlyn Harris on 06/29/23 at  1:20 PM EDT by a video enabled telemedicine application and verified that I am speaking with the correct person using two identifiers.  Location Provider Location : [facility] Patient Location : Home  Participants: Patient , Provider   I discussed the limitations of evaluation and management by telemedicine and the availability of in person appointments. The patient expressed understanding and agreed to proceed.  I discussed the assessment and treatment plan with the patient. The patient was provided an opportunity to ask questions and all were answered. The patient agreed with the plan and demonstrated an understanding of the instructions.   The patient was advised to call back or seek an in-person evaluation if the symptoms worsen or if the condition fails to improve as anticipated.   [facility] MD OP Progress Note  06/29/2023 1:42 PM INZA MIKRUT  MRN:  [id number]  Chief Complaint:  Chief Complaint  Patient presents with   Follow-up   Depression   Anxiety   ADD   Medication Refill   HPI: Caitlyn Harris is a 43 year old, Caucasian female, married, employed, lives in [place], has a history of MDD, GAD, ADHD, IBS, migraine headaches, hysterectomy was evaluated by telemedicine today.  She is experiencing worsening symptoms of depression and anxiety, which she attributes to upcoming significant dates, including her late mother's birthday and her parents' anniversary, the first without her mother. Her son's birthday is also a difficult time. She feels more down, tired, and depressed, struggling with motivation and concentration.  Denies thoughts of self-harm or harm to others.  She has a history of ADHD and is currently taking Adderall, with a morning dose and a noon dose if she remembers. She takes the noon dose approximately three times a week, avoiding it later than noon to prevent sleep disturbances. Her  ADHD symptoms, such as difficulty focusing and forgetfulness, are impacting her work performance, as she often gets distracted and forgets tasks.  She experiences migraine headaches and is currently taking Topamax twice a day. She has run out of her rescue medication, Bernita Raisin, which is at the pharmacy. She also takes Ajovy shots every 28 days. Her migraine triggers include sleeping too much, skipping meals, and certain foods like cilantro, chocolates, and bananas. She has a migraine today, which she believes is due to skipping lunch yesterday.  She has had to leave work early and is currently at home in her bed with a headache.  Regarding her sleep, she mentions sleeping a lot over the past weekend and uses Ambien as needed for sleep.  She looks forward to her upcoming appointment with therapist and plans to keep it.  Currently compliant on her medications including Adderall, sertraline, denies side effects.  Visit Diagnosis:    ICD-10-CM   1. MDD (major depressive disorder), recurrent episode, mild (HCC)  F33.0     2. GAD (generalized anxiety disorder)  F41.1 sertraline (ZOLOFT) 50 MG tablet    3. Attention deficit hyperactivity disorder (ADHD), combined type  F90.2 amphetamine-dextroamphetamine (ADDERALL XR) 30 MG 24 hr capsule    4. Insomnia, unspecified type  G47.00     5. Bereavement  Z63.4       Past Psychiatric History: I have reviewed past psychiatric history from progress note on 11/15/2021.  Past trials of medications like Celexa, Ambien, ADHD medications like stimulants-Vyvanse, several others.  Patient had ADHD testing-per Dr. Trevor Barker-01/29/2022-patient meets criteria for ADHD combined type.  Patient completed MH IOP November 2023.  Past Medical History:  Past Medical History:  Diagnosis Date   Anxiety    Depression    Endometriosis    GERD (gastroesophageal reflux disease)    OCC   History of PCOS    Hyperlipidemia    Migraine    Seizures (HCC)    FEBRILE AS A  BABY    Past Surgical History:  Procedure Laterality Date   ABDOMINAL HYSTERECTOMY     CERVICAL BIOPSY  W/ LOOP ELECTRODE EXCISION     COLONOSCOPY WITH PROPOFOL N/A 01/16/2022   Procedure: COLONOSCOPY WITH PROPOFOL;  Surgeon: Wyline Mood, MD;  Location: [facility] ENDOSCOPY;  Service: Gastroenterology;  Laterality: N/A;   ECTOPIC PREGNANCY SURGERY     endometirosis     ESOPHAGOGASTRODUODENOSCOPY (EGD) WITH PROPOFOL N/A 01/16/2022   Procedure: ESOPHAGOGASTRODUODENOSCOPY (EGD) WITH PROPOFOL;  Surgeon: Wyline Mood, MD;  Location: [facility] ENDOSCOPY;  Service: Gastroenterology;  Laterality: N/A;   HAND SURGERY     THUMB SURGERY   LAPAROSCOPIC OVARIAN CYSTECTOMY Left 03/02/2018   Procedure: LAPAROSCOPIC RIGHT OVARIAN CYSTECTOMY;  Surgeon: Nadara Mustard, MD;  Location: [facility] ORS;  Service: Gynecology;  Laterality: Left;   LAPAROSCOPIC OVARIAN CYSTECTOMY Left 08/30/2019   Procedure: LAPAROSCOPIC OVARIAN CYSTECTOMY;  Surgeon: Nadara Mustard, MD;  Location: [facility] ORS;  Service: Gynecology;  Laterality: Left;   LEEP     LEEP     PERINEOPLASTY N/A 03/02/2018   Procedure: PERINEORRHAPHY;  Surgeon: Nadara Mustard, MD;  Location: [facility] ORS;  Service: Gynecology;  Laterality: N/A;   RECTOCELE REPAIR N/A 03/02/2018   Procedure: POSTERIOR REPAIR (RECTOCELE);  Surgeon: Nadara Mustard, MD;  Location: [facility] ORS;  Service: Gynecology;  Laterality: N/A;   VENTRAL HERNIA REPAIR N/A 03/19/2017   Procedure: HERNIA REPAIR VENTRAL ADULT;  Surgeon: Lattie Haw, MD;  Location: [facility] ORS;  Service: General;  Laterality: N/A;    Family Psychiatric History: I have reviewed family psychiatric history from progress note on 11/15/2021.  Family History:  Family History  Problem Relation Age of Onset   Hypertension Mother    Diabetes Mother    CVA Mother    Chronic Renal Failure Mother    Renal cancer Father    Hypertension Father    Post-traumatic stress disorder Brother    Drug abuse Maternal Uncle    Alcohol  abuse Maternal Uncle    Colon cancer Paternal Aunt    Bipolar disorder Half-Sister    Bipolar disorder Niece    Suicidality Other     Social History: I have reviewed social history from progress note on 11/15/2021. Social History   Socioeconomic History   Marital status: Married    Spouse name: Not on file   Number of children: 2   Years of education: Not on file   Highest education level: Master's degree (e.g., MA, MS, MEng, MEd, MSW, MBA)  Occupational History   Not on file  Tobacco Use   Smoking status: Former    Current packs/day: 0.00    Types: Cigarettes    Start date: 03/12/2002    Quit date: 03/12/2009    Years since quitting: 14.3   Smokeless tobacco: Never   Tobacco comments:    1 PACK EVERY 2 WEEKS  Vaping Use   Vaping status: Never Used  Substance and Sexual Activity   Alcohol use: Not Currently    Comment: RARE   Drug use: No   Sexual activity: Yes  Other Topics Concern   Not on  file  Social History Narrative   Right handed   Caffeine prn   Two story home   Lives with husband and two sons and mom   Currently Occcupational therapist   Social Drivers of Health   Financial Resource Strain: Low Risk  (06/16/2023)   Overall Financial Resource Strain (CARDIA)    Difficulty of Paying Living Expenses: Not hard at all  Food Insecurity: No Food Insecurity (06/16/2023)   Hunger Vital Sign    Worried About Running Out of Food in the Last Year: Never true    Ran Out of Food in the Last Year: Never true  Transportation Needs: No Transportation Needs (06/16/2023)   PRAPARE - Administrator, Civil Service (Medical): No    Lack of Transportation (Non-Medical): No  Physical Activity: Inactive (06/16/2023)   Exercise Vital Sign    Days of Exercise per Week: 0 days    Minutes of Exercise per Session: 0 min  Stress: Stress Concern Present (06/16/2023)   [employer] of Occupational Health - Occupational Stress Questionnaire    Feeling of Stress : To some  extent  Social Connections: Moderately Isolated (06/16/2023)   Social Connection and Isolation Panel [NHANES]    Frequency of Communication with Friends and Family: Three times a week    Frequency of Social Gatherings with Friends and Family: Never    Attends Religious Services: Never    Database administrator or Organizations: No    Attends Banker Meetings: Never    Marital Status: Married    Allergies:  Allergies  Allergen Reactions   Tape     Low sensitivity to tape, ok with Tegaderm and paper tape     Metabolic Disorder Labs: Lab Results  Component Value Date   HGBA1C 5.2 03/10/2023   Lab Results  Component Value Date   PROLACTIN 13.3 12/21/2017   Lab Results  Component Value Date   CHOL 188 05/12/2023   TRIG (H) 05/12/2023    405.0 Triglyceride is over 400; calculations on Lipids are invalid.   HDL 35.40 (L) 05/12/2023   CHOLHDL 5 05/12/2023   VLDL 81.0 (H) 05/12/2023   LDLCALC 75 06/07/2018   Lab Results  Component Value Date   TSH 1.52 03/10/2023   TSH 1.57 06/26/2021    Therapeutic Level Labs: No results found for: "LITHIUM" No results found for: "VALPROATE" No results found for: "CBMZ"  Current Medications: Current Outpatient Medications  Medication Sig Dispense Refill   amphetamine-dextroamphetamine (ADDERALL XR) 30 MG 24 hr capsule Take 1 capsule (30 mg total) by mouth every morning. 30 capsule 0   acetaminophen (TYLENOL) 500 MG tablet Take 1,000 mg by mouth every 6 (six) hours as needed for moderate pain or headache.     amphetamine-dextroamphetamine (ADDERALL) 10 MG tablet Take 1 tablet (10 mg total) by mouth daily after lunch. 30 tablet 0   cholecalciferol (VITAMIN D3) 25 MCG (1000 UNIT) tablet Take 5,000 Units by mouth daily.     clonazePAM (KLONOPIN) 0.5 MG tablet Take 0.5-1 tablets (0.25-0.5 mg total) by mouth daily as needed for anxiety. Please limit use 10 tablet 0   Fremanezumab-vfrm (AJOVY) 225 MG/1.5ML SOAJ Inject 225 mg into  the skin every 28 (twenty-eight) days. 1.68 mL 11   hydrOXYzine (VISTARIL) 25 MG capsule Take 1-2 capsules (25-50 mg total) by mouth at bedtime as needed. For sleep and anxiety 180 capsule 0   ibuprofen (ADVIL) 400 MG tablet Take 400 mg by mouth every 6 (six)  hours as needed.     omeprazole (PRILOSEC) 20 MG capsule TAKE 1 CAPSULE (20 MG TOTAL) BY MOUTH 2 (TWO) TIMES DAILY BEFORE A MEAL. 180 capsule 3   ondansetron (ZOFRAN-ODT) 8 MG disintegrating tablet Take 1 tablet (8 mg total) by mouth every 8 (eight) hours as needed for nausea or vomiting. 20 tablet 0   sertraline (ZOLOFT) 50 MG tablet Take 1.5 tablets (75 mg total) by mouth daily after supper. 135 tablet 0   SUMAtriptan (IMITREX) 100 MG tablet MAY REPEAT IN 2 HOURS IF HEADACHE PERSISTS OR RECURS. 30 tablet 3   tiZANidine (ZANAFLEX) 4 MG capsule TAKE 1 CAPSULE BY MOUTH 3 TIMES DAILY AS NEEDED FOR MUSCLE SPASMS. 30 capsule 3   topiramate (TOPAMAX) 100 MG tablet TAKE 1 TABLET BY MOUTH TWICE A DAY 180 tablet 1   Ubrogepant (UBRELVY) 100 MG TABS Take 1 tablet (100 mg total) by mouth as needed. May repeat after 2 hours.  Maximum 2 tablets in 24 hours. 16 tablet 11   zolpidem (AMBIEN) 5 MG tablet Take 0.5-1 tablets (2.5-5 mg total) by mouth at bedtime as needed for sleep. 30 tablet 0   No current facility-administered medications for this visit.     Musculoskeletal: Strength & Muscle Tone:  UTA Gait & Station:  Laying down Patient leans: N/A  Psychiatric Specialty Exam: Review of Systems  Neurological:  Positive for headaches.  Psychiatric/Behavioral:  Positive for decreased concentration, dysphoric mood and sleep disturbance.        Grief    There were no vitals taken for this visit.There is no height or weight on file to calculate BMI.  General Appearance: Casual  Eye Contact:  Fair  Speech:  Clear and Coherent  Volume:  Normal  Mood:   grief , sadness  Affect:  Congruent  Thought Process:  Goal Directed and Descriptions of  Associations: Intact  Orientation:  Full (Time, Place, and Person)  Thought Content: Logical   Suicidal Thoughts:  No  Homicidal Thoughts:  No  Memory:  Immediate;   Fair Recent;   Fair Remote;   Fair  Judgement:  Fair  Insight:  Fair  Psychomotor Activity:  Normal  Concentration:  Concentration: Fair and Attention Span: Fair  Recall:  [employer] of Knowledge: Fair  Language: Fair  Akathisia:  No  Handed:  Right  AIMS (if indicated): not done  Assets:  Communication Skills Desire for Improvement Housing Social Support  ADL's:  Intact  Cognition: WNL  Sleep:   excessive   Screenings: GAD-7    Advertising copywriter from 06/16/2023 in [facility] Office Visit from 05/06/2023 in [facility] Office Visit from 03/11/2023 in [facility] Office Visit from 12/25/2022 in [facility] Office Visit from 07/23/2022 in [facility]  Total GAD-7 Score 8 7 8 13 8       PHQ2-9    Flowsheet Row Counselor from 06/16/2023 in [facility] Office Visit from 05/06/2023 in [facility] Office Visit from 03/11/2023 in [facility] Office Visit from 03/10/2023 in [facility] Office Visit from 12/25/2022 in [facility]  PHQ-2 Total Score 6 4 3 4 6   PHQ-9 Total Score 23 15 18 18 21       Flowsheet Row Video Visit from 06/29/2023 in [facility] Counselor from 06/16/2023 in [facility] Office Visit from 05/06/2023 in [facility]  C-SSRS RISK CATEGORY No Risk Moderate Risk No Risk        Assessment  and Plan: Caitlyn Harris is a 43 year old Caucasian female, currently employed, married, has a history of depression, grief, ADHD was evaluated by telemedicine, discussed assessment and plan as noted below.  Attention Deficit Hyperactivity Disorder (ADHD)-unstable Ongoing difficulties with concentration, motivation, and attention, particularly at work. Current Adderall regimen is not fully effective, with missed noon doses due to scheduling conflicts, leading to suboptimal symptom control. Potential increase in Adderall dosage discussed to improve focus and attention, but advised to delay increase until migraine symptoms improve due to potential exacerbation of migraines. - Increase Adderall dosage to 30 mg in the morning, with an optional 10 mg dose after lunch as needed, but delay increase until migraine symptoms improve. - Monitor for potential side effects, including increased migraine frequency or severity. - Reviewed [place] PMP AWARxE   Depression - unstable Depression exacerbated by grief. Current sertraline dosage is well-managed, but adjustment may be needed if symptoms persist. Discussed potential for serotonin syndrome and advised monitoring for changes. - Continue Sertraline 75 mg daily - Consider increasing Sertraline dosage if depressive symptoms do not improve with the adjusted ADHD medication regimen.  Grief-unstable Increased grief symptoms due to upcoming significant dates related to deceased mother, leading to feeling down, tired, and depressed, particularly at home. Grief impacts motivation and emotional well-being, but no suicidal ideations. - Continue therapy sessions with therapist Miss Alinda Deem, with appointments on 4/15, 4/28, and 5/13. - Encourage open communication about feelings and coping strategies. - Continue Hydroxyzine 25-50 mg at bedtime as needed - Continue Clonazepam 0.25 mg - 0.5 mg as needed for severe anxiety/grief reaction.  Sleep  Disturbance-unstable Variable sleep patterns, with excessive sleep over the weekend potentially contributing to migraine onset. Ambien is used as needed for sleep. Advised to maintain a regular sleep schedule to prevent excessive sleep and associated migraine triggers. - Continue Ambien 2.5-5 mg as needed for sleep management. - Encourage regular sleep schedule to prevent excessive sleep and associated migraine triggers.  Follow-up - Follow-up in clinic in 3 to 4 weeks or sooner if needed.  Collaboration of Care: Collaboration of Care: Referral or follow-up with counselor/therapist AEB encouraged to continue psychotherapy sessions  Patient/Guardian was advised Release of Information must be obtained prior to any record release in order to collaborate their care with an outside provider. Patient/Guardian was advised if they have not already done so to contact the registration department to sign all necessary forms in order for [place] to release information regarding their care.   Consent: Patient/Guardian gives verbal consent for treatment and assignment of benefits for services provided during this visit. Patient/Guardian expressed understanding and agreed to proceed.  Discussed the use of a AI scribe software for clinical note transcription with the patient, who gave verbal consent to proceed.  This note was generated in part or whole with voice recognition software. Voice recognition is usually quite accurate but there are transcription errors that can and very often do occur. I apologize for any typographical errors that were not detected and corrected.     Jomarie Longs, MD 06/30/2023, 1:30 PM

## 2023-06-30 ENCOUNTER — Telehealth: Payer: Self-pay

## 2023-06-30 NOTE — Telephone Encounter (Signed)
 Received fax requesting a Prior Authorization for the patients Amphetamine-Dextroamphet ER 30 mg capsules sent to patients insurance via CoverMyMeds  Approved by [facility] NCPDP 2017 Coverage 06-29-23----06-29-26 [place] case ID #[id number]

## 2023-07-21 ENCOUNTER — Ambulatory Visit (INDEPENDENT_AMBULATORY_CARE_PROVIDER_SITE_OTHER): Admitting: Professional Counselor

## 2023-07-21 DIAGNOSIS — Z91199 Patient's noncompliance with other medical treatment and regimen due to unspecified reason: Secondary | ICD-10-CM

## 2023-07-21 NOTE — Progress Notes (Signed)
 Patient no-showed today's appointment; appointment was for 07/21/23 for outpatient therapy. Cln sent link to join virtual session and pt did not check in for appointment.

## 2023-08-03 ENCOUNTER — Ambulatory Visit (INDEPENDENT_AMBULATORY_CARE_PROVIDER_SITE_OTHER): Admitting: Professional Counselor

## 2023-08-03 DIAGNOSIS — Z91199 Patient's noncompliance with other medical treatment and regimen due to unspecified reason: Secondary | ICD-10-CM

## 2023-08-03 NOTE — Progress Notes (Signed)
 Patient no-showed today's appointment; appointment was for 08/03/23 for follow-up outpatient therapy. Sent virtual appt link via text and pt did not check in for session. Sent communication due to consecutive no-shows.

## 2023-08-07 ENCOUNTER — Telehealth (INDEPENDENT_AMBULATORY_CARE_PROVIDER_SITE_OTHER): Admitting: Psychiatry

## 2023-08-07 ENCOUNTER — Encounter: Payer: Self-pay | Admitting: Psychiatry

## 2023-08-07 DIAGNOSIS — F3341 Major depressive disorder, recurrent, in partial remission: Secondary | ICD-10-CM | POA: Diagnosis not present

## 2023-08-07 DIAGNOSIS — F902 Attention-deficit hyperactivity disorder, combined type: Secondary | ICD-10-CM

## 2023-08-07 DIAGNOSIS — G4700 Insomnia, unspecified: Secondary | ICD-10-CM | POA: Diagnosis not present

## 2023-08-07 DIAGNOSIS — F330 Major depressive disorder, recurrent, mild: Secondary | ICD-10-CM | POA: Insufficient documentation

## 2023-08-07 DIAGNOSIS — F411 Generalized anxiety disorder: Secondary | ICD-10-CM | POA: Diagnosis not present

## 2023-08-07 DIAGNOSIS — Z634 Disappearance and death of family member: Secondary | ICD-10-CM

## 2023-08-07 MED ORDER — AMPHETAMINE-DEXTROAMPHET ER 30 MG PO CP24: 30.0000 mg | ORAL_CAPSULE | ORAL | 0 refills | Status: DC

## 2023-08-07 NOTE — Progress Notes (Signed)
 Virtual Visit via Video Note  I connected with Caitlyn Harris on 08/07/23 at 11:00 AM EDT by a video enabled telemedicine application and verified that I am speaking with the correct person using two identifiers.  Location Provider Location : [facility] Patient Location : Work  Participants: Patient , Provider    I discussed the limitations of evaluation and management by telemedicine and the availability of in person appointments. The patient expressed understanding and agreed to proceed.   I discussed the assessment and treatment plan with the patient. The patient was provided an opportunity to ask questions and all were answered. The patient agreed with the plan and demonstrated an understanding of the instructions.   The patient was advised to call back or seek an in-person evaluation if the symptoms worsen or if the condition fails to improve as anticipated.   BH MD OP Progress Note  08/07/2023 12:17 PM Caitlyn Harris  MRN:  [id number]  Chief Complaint:  Chief Complaint  Patient presents with   Follow-up   Anxiety   ADD   Depression   Medication Refill   Discussed the use of AI scribe software for clinical note transcription with the patient, who gave verbal consent to proceed.  History of Present Illness Caitlyn Harris "Caitlyn Harris" is a 43 year old Caucasian female, married, employed, lives in [place], has a history of MDD, GAD, ADHD, IBS, migraine headaches, hysterectomy was evaluated by telemedicine today.  She has been experiencing significant fatigue over the past few weeks, despite feeling like she is sleeping adequately. Her sleep is interrupted by night sweats, causing her to feel overheated even with the use of two fans. She wakes up around 5:30 AM, earlier than her alarm, and feels restless during the night.  She has noticed lumps on both sides of her neck, which she has not shown to anyone due to her busy schedule. She also reports persistent itchy ears, which  wake her up at night. Despite previous examinations showing nothing in her ears, she feels like she sweats inside her ears, requiring her to use a Q-tip.   She has been taking Adderall 30 mg in the morning, which helps her focus, although her mornings remain chaotic. She has not been taking Ambien  regularly as she feels she can fall asleep initially, but her sleep is interrupted. She has lost weight, now weighing 239 pounds, down from 254 pounds, attributed to cutting carbs and sugar, although she has not increased her exercise.  She missed a couple of therapy appointments, one due to forgetfulness and another due to a migraine and abdominal pain, described as cystic pain. She attempted to reschedule but was unable to send a message through MyChart.  Denies heart palpitations, shortness of breath, or side effects from her medications.  Denies thoughts of self-harm or harm to others.    Visit Diagnosis:    ICD-10-CM   1. Recurrent major depressive disorder, in partial remission (HCC)  F33.41     2. GAD (generalized anxiety disorder)  F41.1     3. Attention deficit hyperactivity disorder (ADHD), combined type  F90.2 amphetamine -dextroamphetamine  (ADDERALL XR) 30 MG 24 hr capsule    4. Insomnia, unspecified type  G47.00     5. Bereavement  Z63.4       Past Psychiatric History: I have reviewed past psychiatric history from progress note on 11/15/2021.  Past trials of medications like Celexa , Ambien , ADHD medications like stimulants-Vyvanse  and several others.  She had ADHD testing-per Dr. Marlan Silva  [place]-01/29/2022-patient meets criteria for ADHD combined type.  She completed MH IOP-November 2023.  Past Medical History:  Past Medical History:  Diagnosis Date   Anxiety    Depression    Endometriosis    GERD (gastroesophageal reflux disease)    OCC   History of PCOS    Hyperlipidemia    Migraine    Seizures (HCC)    FEBRILE AS A BABY    Past Surgical History:  Procedure Laterality  Date   ABDOMINAL HYSTERECTOMY     CERVICAL BIOPSY  W/ LOOP ELECTRODE EXCISION     COLONOSCOPY WITH PROPOFOL  N/A 01/16/2022   Procedure: COLONOSCOPY WITH PROPOFOL ;  Surgeon: Luke Salaam, MD;  Location: [facility] ENDOSCOPY;  Service: Gastroenterology;  Laterality: N/A;   ECTOPIC PREGNANCY SURGERY     endometirosis     ESOPHAGOGASTRODUODENOSCOPY (EGD) WITH PROPOFOL  N/A 01/16/2022   Procedure: ESOPHAGOGASTRODUODENOSCOPY (EGD) WITH PROPOFOL ;  Surgeon: Luke Salaam, MD;  Location[facility] ENDOSCOPY;  Service: Gastroenterology;  Laterality: N/A;   HAND SURGERY     THUMB SURGERY   LAPAROSCOPIC OVARIAN CYSTECTOMY Left 03/02/2018   Procedure: LAPAROSCOPIC RIGHT OVARIAN CYSTECTOMY;  Surgeon: Alben Alma, MD;  Location: [facility] ORS;  Service: Gynecology;  Laterality: Left;   LAPAROSCOPIC OVARIAN CYSTECTOMY Left 08/30/2019   Procedure: LAPAROSCOPIC OVARIAN CYSTECTOMY;  Surgeon: Alben Alma, MD;  Location: [facility] ORS;  Service: Gynecology;  Laterality: Left;   LEEP     LEEP     PERINEOPLASTY N/A 03/02/2018   Procedure: PERINEORRHAPHY;  Surgeon: Alben Alma, MD;  Location: [facility] ORS;  Service: Gynecology;  Laterality: N/A;   RECTOCELE REPAIR N/A 03/02/2018   Procedure: POSTERIOR REPAIR (RECTOCELE);  Surgeon: Alben Alma, MD;  Location: [facility] ORS;  Service: Gynecology;  Laterality: N/A;   VENTRAL HERNIA REPAIR N/A 03/19/2017   Procedure: HERNIA REPAIR VENTRAL ADULT;  Surgeon: Claudia Cuff, MD;  Location: [facility] ORS;  Service: General;  Laterality: N/A;    Family Psychiatric History: I have reviewed family psychiatric history from progress note on 11/15/2021.  Family History:  Family History  Problem Relation Age of Onset   Hypertension Mother    Diabetes Mother    CVA Mother    Chronic Renal Failure Mother    Renal cancer Father    Hypertension Father    Post-traumatic stress disorder Brother    Drug abuse Maternal Uncle    Alcohol abuse Maternal Uncle    Colon cancer Paternal Aunt     Bipolar disorder Half-Sister    Bipolar disorder Niece    Suicidality Other     Social History: I have reviewed social history from progress note on 11/15/2021. Social History   Socioeconomic History   Marital status: Married    Spouse name: Not on file   Number of children: 2   Years of education: Not on file   Highest education level: Master's degree (e.g., MA, MS, MEng, MEd, MSW, MBA)  Occupational History   Not on file  Tobacco Use   Smoking status: Former    Current packs/day: 0.00    Types: Cigarettes    Start date: 03/12/2002    Quit date: 03/12/2009    Years since quitting: 14.4   Smokeless tobacco: Never   Tobacco comments:    1 PACK EVERY 2 WEEKS  Vaping Use   Vaping status: Never Used  Substance and Sexual Activity   Alcohol use: Not Currently    Comment: RARE   Drug use: No   Sexual activity: Yes  Other Topics Concern   Not on file  Social History Narrative   Right handed   Caffeine prn   Two story home   Lives with husband and two sons and mom   Currently Occcupational therapist   Social Drivers of Health   Financial Resource Strain: Low Risk  (06/16/2023)   Overall Financial Resource Strain (CARDIA)    Difficulty of Paying Living Expenses: Not hard at all  Food Insecurity: No Food Insecurity (06/16/2023)   Hunger Vital Sign    Worried About Running Out of Food in the Last Year: Never true    Ran Out of Food in the Last Year: Never true  Transportation Needs: No Transportation Needs (06/16/2023)   PRAPARE - Administrator, Civil Service (Medical): No    Lack of Transportation (Non-Medical): No  Physical Activity: Inactive (06/16/2023)   Exercise Vital Sign    Days of Exercise per Week: 0 days    Minutes of Exercise per Session: 0 min  Stress: Stress Concern Present (06/16/2023)   [employer] of Occupational Health - Occupational Stress Questionnaire    Feeling of Stress : To some extent  Social Connections: Moderately Isolated  (06/16/2023)   Social Connection and Isolation Panel [NHANES]    Frequency of Communication with Friends and Family: Three times a week    Frequency of Social Gatherings with Friends and Family: Never    Attends Religious Services: Never    Database administrator or Organizations: No    Attends Banker Meetings: Never    Marital Status: Married    Allergies:  Allergies  Allergen Reactions   Tape     Low sensitivity to tape, ok with Tegaderm and paper tape     Metabolic Disorder Labs: Lab Results  Component Value Date   HGBA1C 5.2 03/10/2023   Lab Results  Component Value Date   PROLACTIN 13.3 12/21/2017   Lab Results  Component Value Date   CHOL 188 05/12/2023   TRIG (H) 05/12/2023    405.0 Triglyceride is over 400; calculations on Lipids are invalid.   HDL 35.40 (L) 05/12/2023   CHOLHDL 5 05/12/2023   VLDL 81.0 (H) 05/12/2023   LDLCALC 75 06/07/2018   Lab Results  Component Value Date   TSH 1.52 03/10/2023   TSH 1.57 06/26/2021    Therapeutic Level Labs: No results found for: "LITHIUM" No results found for: "VALPROATE" No results found for: "CBMZ"  Current Medications: Current Outpatient Medications  Medication Sig Dispense Refill   acetaminophen  (TYLENOL ) 500 MG tablet Take 1,000 mg by mouth every 6 (six) hours as needed for moderate pain or headache.     amphetamine -dextroamphetamine  (ADDERALL XR) 30 MG 24 hr capsule Take 1 capsule (30 mg total) by mouth every morning. 30 capsule 0   amphetamine -dextroamphetamine  (ADDERALL) 10 MG tablet Take 1 tablet (10 mg total) by mouth daily after lunch. 30 tablet 0   cholecalciferol (VITAMIN D3) 25 MCG (1000 UNIT) tablet Take 5,000 Units by mouth daily.     clonazePAM  (KLONOPIN ) 0.5 MG tablet Take 0.5-1 tablets (0.25-0.5 mg total) by mouth daily as needed for anxiety. Please limit use 10 tablet 0   Fremanezumab -vfrm (AJOVY ) 225 MG/1.5ML SOAJ Inject 225 mg into the skin every 28 (twenty-eight) days. 1.68 mL 11    hydrOXYzine  (VISTARIL ) 25 MG capsule Take 1-2 capsules (25-50 mg total) by mouth at bedtime as needed. For sleep and anxiety 180 capsule 0   ibuprofen  (ADVIL ) 400 MG tablet Take  400 mg by mouth every 6 (six) hours as needed.     omeprazole  (PRILOSEC) 20 MG capsule TAKE 1 CAPSULE (20 MG TOTAL) BY MOUTH 2 (TWO) TIMES DAILY BEFORE A MEAL. 180 capsule 3   ondansetron  (ZOFRAN -ODT) 8 MG disintegrating tablet Take 1 tablet (8 mg total) by mouth every 8 (eight) hours as needed for nausea or vomiting. 20 tablet 0   sertraline  (ZOLOFT ) 50 MG tablet Take 1.5 tablets (75 mg total) by mouth daily after supper. 135 tablet 0   SUMAtriptan  (IMITREX ) 100 MG tablet MAY REPEAT IN 2 HOURS IF HEADACHE PERSISTS OR RECURS. 30 tablet 3   tiZANidine  (ZANAFLEX ) 4 MG capsule TAKE 1 CAPSULE BY MOUTH 3 TIMES DAILY AS NEEDED FOR MUSCLE SPASMS. 30 capsule 3   topiramate  (TOPAMAX ) 100 MG tablet TAKE 1 TABLET BY MOUTH TWICE A DAY 180 tablet 1   Ubrogepant  (UBRELVY ) 100 MG TABS Take 1 tablet (100 mg total) by mouth as needed. May repeat after 2 hours.  Maximum 2 tablets in 24 hours. 16 tablet 11   zolpidem  (AMBIEN ) 5 MG tablet Take 0.5-1 tablets (2.5-5 mg total) by mouth at bedtime as needed for sleep. 30 tablet 0   No current facility-administered medications for this visit.     Musculoskeletal: Strength & Muscle Tone:  UTA Gait & Station:  Seated Patient leans: N/A  Psychiatric Specialty Exam: Review of Systems  Psychiatric/Behavioral:  Positive for decreased concentration and sleep disturbance. The patient is nervous/anxious.        Grief - improving    There were no vitals taken for this visit.There is no height or weight on file to calculate BMI.  General Appearance: Casual  Eye Contact:  Fair  Speech:  Clear and Coherent  Volume:  Normal  Mood:  Anxious. Grief - improving  Affect:  Congruent  Thought Process:  Goal Directed and Descriptions of Associations: Intact  Orientation:  Full (Time, Place, and  Person)  Thought Content: Logical   Suicidal Thoughts:  No  Homicidal Thoughts:  No  Memory:  Immediate;   Fair Recent;   Fair Remote;   Fair  Judgement:  Fair  Insight:  Fair  Psychomotor Activity:  Normal  Concentration:  Concentration: Fair and Attention Span: Fair  Recall:  [employer] of Knowledge: Fair  Language: Fair  Akathisia:  No  Handed:  Right  AIMS (if indicated): not done  Assets:  Desire for Improvement Housing Social Support  ADL's:  Intact  Cognition: WNL  Sleep:  Poor, night sweats( chronic)   Screenings: GAD-7    Advertising copywriter from 06/16/2023 in [facility] Office Visit from 05/06/2023 in [facility] Office Visit from 03/11/2023 in [facility] Office Visit from 12/25/2022 in [facility] Office Visit from 07/23/2022 in [facility]  Total GAD-7 Score 8 7 8 13 8       PHQ2-9    Flowsheet Row Counselor from 06/16/2023 in [facility] Office Visit from 05/06/2023 in [facility] Office Visit from 03/11/2023 in [facility] Office Visit from 03[facility]10/2023 in [facility] Office Visit from 12/25/2022 in [facility]  PHQ-2 Total Score 6 4 3 4 6   PHQ-9 Total Score 23 15 18 18 21       Flowsheet Row Video Visit  from 08/07/2023 in [facility] Video Visit from 06/29/2023 in [facility] Counselor from 06/16/2023 in [facility]  C-SSRS RISK CATEGORY No Risk No Risk Moderate Risk        Assessment and Plan:Caitlyn Harris is a 43 year old Caucasian female,  currently employed, has a history of depression, grief, ADHD was evaluated by telemedicine today, discussed assessment and plan as noted below.  Assessment & Plan  ADHD-improving Currently reports she is more compliant with her Adderall extended release 30 mg in the morning although she has not been taking the 10 mg in the afternoon due to her schedule at school.  She reports the Adderall has been beneficial in improving her focus and attention. - Continue Adderall XR 30 mg in the morning - Continue Adderall 10 mg immediate release in the afternoon as needed, encouraged compliance - Reviewed [place] PMP AWARxE  Depression in partial remission Currently reports depression symptoms as improving although she continues to have grief with certain triggers.  She has been making use of her support system and is motivated to stay in therapy although she has missed a few appointments with therapist recently. - Continue Sertraline  75 mg daily  Bereavement-improving She is motivated to stay in therapy and has been making use of her support system. - Continue psychotherapy sessions for grief with Ms. Josiephine Nightingale. - Continue Hydroxyzine  25 to 50 mg at bedtime as needed  Insomnia-unstable Current sleep problems due to night sweats, headaches,itchy year. - Patient to make use of Ambien  2.5-5 mg as needed for sleep-she has not been compliant - Encouraged to follow up with her primary care provider about her physical complaints.  Follow-up - Schedule follow-up appointment for July 30th at 1 PM in person.    Collaboration of Care: Collaboration of Care: Referral or follow-up with counselor/therapist AEB encouraged to continue CBT.  I have communicated with staff regarding this patient's concern about her missed therapy appointments.  Will coordinate care with therapist as well.  Patient/Guardian was advised Release of Information must be obtained prior to any record release in order to collaborate their  care with an outside provider. Patient/Guardian was advised if they have not already done so to contact the registration department to sign all necessary forms in order for us  to release information regarding their care.   Consent: Patient/Guardian gives verbal consent for treatment and assignment of benefits for services provided during this visit. Patient/Guardian expressed understanding and agreed to proceed.   This note was generated in part or whole with voice recognition software. Voice recognition is usually quite accurate but there are transcription errors that can and very often do occur. I apologize for any typographical errors that were not detected and corrected.    Missey Hasley, MD 08/07/2023, 12:17 PM

## 2023-08-10 ENCOUNTER — Other Ambulatory Visit: Payer: Self-pay | Admitting: Neurology

## 2023-08-13 ENCOUNTER — Ambulatory Visit: Admitting: Family Medicine

## 2023-08-13 ENCOUNTER — Ambulatory Visit: Payer: Self-pay | Admitting: *Deleted

## 2023-08-13 ENCOUNTER — Other Ambulatory Visit: Payer: Self-pay

## 2023-08-13 ENCOUNTER — Emergency Department

## 2023-08-13 ENCOUNTER — Emergency Department
Admission: EM | Admit: 2023-08-13 | Discharge: 2023-08-13 | Disposition: A | Discharge status: Home / Self Care | Attending: Emergency Medicine | Admitting: Emergency Medicine

## 2023-08-13 ENCOUNTER — Telehealth: Payer: Self-pay | Admitting: Family Medicine

## 2023-08-13 DIAGNOSIS — N83209 Unspecified ovarian cyst, unspecified side: Secondary | ICD-10-CM

## 2023-08-13 DIAGNOSIS — N83202 Unspecified ovarian cyst, left side: Secondary | ICD-10-CM | POA: Insufficient documentation

## 2023-08-13 DIAGNOSIS — R1032 Left lower quadrant pain: Secondary | ICD-10-CM | POA: Diagnosis present

## 2023-08-13 LAB — URINALYSIS, ROUTINE W REFLEX MICROSCOPIC
Bilirubin Urine: NEGATIVE
Glucose, UA: NEGATIVE mg/dL
Hgb urine dipstick: NEGATIVE
Ketones, ur: NEGATIVE mg/dL
Leukocytes,Ua: NEGATIVE
Nitrite: NEGATIVE
Protein, ur: 30 mg/dL — AB
Specific Gravity, Urine: 1.03 (ref 1.005–1.030)
pH: 5 (ref 5.0–8.0)

## 2023-08-13 MED ORDER — IBUPROFEN 600 MG PO TABS
600.0000 mg | ORAL_TABLET | Freq: Once | ORAL | Status: AC
  Administered 2023-08-13: 600 mg via ORAL
  Filled 2023-08-13: qty 1

## 2023-08-13 NOTE — Telephone Encounter (Signed)
 Aware, will watch for correspondence

## 2023-08-13 NOTE — ED Notes (Signed)
 See triage note.

## 2023-08-13 NOTE — Telephone Encounter (Signed)
  Chief Complaint: LLQ pain Symptoms: pain lower left radiates to back, severe, nausea, tenderness Frequency: chronic- comes and goes- lasts for hours- getting worse Pertinent Negatives: Patient denies diarrhea, fever, urination pain, vomiting Disposition: [x] ED /[] Urgent Care (no appt availability in office) / [] Appointment(In office/virtual)/ []  [facility]/ [] Home Care/ [] Refused Recommended Disposition /[] [facility]/ []  Follow-up with PCP Additional Notes: ED recommended due to severe pain  Copied from CRM [id number]. Topic: Clinical - Red Word Triage >> Aug 13, 2023  8:06 AM Marissa P wrote: Red Word that prompted transfer to Nurse Triage: Lower abdominal pain, left side. Radiating to her back. Reason for Disposition  [1] SEVERE pain (e.g., excruciating) AND [2] present > 1 hour  Answer Assessment - Initial Assessment Questions 1. LOCATION: "Where does it hurt?"      LLQ 2. RADIATION: "Does the pain shoot anywhere else?" (e.g., chest, back)     Mirroring in back 3. ONSET: "When did the pain begin?" (e.g., minutes, hours or days ago)      Rated yesterday, comes and goes- monthly 4. SUDDEN: "Gradual or sudden onset?"     gradual 5. PATTERN "Does the pain come and go, or is it constant?"    - If it comes and goes: "How long does it last?" "Do you have pain now?"     (Note: Comes and goes means the pain is intermittent. It goes away completely between bouts.)    - If constant: "Is it getting better, staying the same, or getting worse?"      (Note: Constant means the pain never goes away completely; most serious pain is constant and gets worse.)      Can last days-level of pain changes  6. SEVERITY: "How bad is the pain?"  (e.g., Scale 1-10; mild, moderate, or severe)    - MILD (1-3): Doesn't interfere with normal activities, abdomen soft and not tender to touch.     - MODERATE (4-7): Interferes with normal activities or awakens from sleep, abdomen tender to touch.      - SEVERE (8-10): Excruciating pain, doubled over, unable to do any normal activities.       9/10 7. RECURRENT SYMPTOM: "Have you ever had this type of stomach pain before?" If Yes, ask: "When was the last time?" and "What happened that time?"      *No Answer* 8. CAUSE: "What do you think is causing the stomach pain?"     She has twice/month, thinks has to do with her ovaries 9. RELIEVING/AGGRAVATING FACTORS: "What makes it better or worse?" (e.g., antacids, bending or twisting motion, bowel movement)     Position, pain medication 10. OTHER SYMPTOMS: "Do you have any other symptoms?" (e.g., back pain, diarrhea, fever, urination pain, vomiting)       Back pain, nausea  Protocols used: Abdominal Pain - Female-A-AH

## 2023-08-13 NOTE — Telephone Encounter (Signed)
 Appt with PCP cancelled since pt is at ER, FYI to PCP

## 2023-08-13 NOTE — ED Provider Notes (Signed)
 [facility] Provider Note    Event Date/Time   First MD Initiated Contact with Patient 08/13/23 1009     (approximate)   History   Chief Complaint: Abdominal Pain   HPI  Caitlyn Harris is a 43 y.o. female with a past history of endometriosis, ovarian cysts, status post hysterectomy who comes ED complaining of left lower quadrant abdominal pain which has been colicky and worsening over the last few days.  Feels like an ovarian cyst.  Took ibuprofen  this morning.  Pain has improved, currently mild.  No dysuria or vaginal discharge.  No fever or chills or back pain.        Past Medical History:  Diagnosis Date   Anxiety    Depression    Endometriosis    GERD (gastroesophageal reflux disease)    OCC   History of PCOS    Hyperlipidemia    Migraine    Seizures (HCC)    FEBRILE AS A BABY    Current Outpatient Rx   Order #: [id number] Class: Historical Med   Order #: [id number] Class: Normal   Order #: [id number] Class: Normal   Order #: [id number] Class: Historical Med   Order #: [id number] Class: Normal   Order #: [id number] Class: Normal   Order #: [id number] Class: Normal   Order #: [id number] Class: Historical Med   Order #: [id number] Class: Normal   Order #: [id number] Class: Normal   Order #: [id number] Class: Normal   Order #: [id number] Class: Normal   Order #: [id number] Class: Normal   Order #: [id number] Class: Normal   Order #: [id number] Class: Normal   Order #: [id number] Class: Normal    Past Surgical History:  Procedure Laterality Date   ABDOMINAL HYSTERECTOMY     CERVICAL BIOPSY  W/ LOOP ELECTRODE EXCISION     COLONOSCOPY WITH PROPOFOL  N/A 01/16/2022   Procedure: COLONOSCOPY WITH PROPOFOL ;  Surgeon: Luke Salaam, MD;  Location: [facility] ENDOSCOPY;  Service: Gastroenterology;  Laterality: N/A;   ECTOPIC PREGNANCY SURGERY     endometirosis     ESOPHAGOGASTRODUODENOSCOPY (EGD) WITH PROPOFOL  N/A 01/16/2022   Procedure: ESOPHAGOGASTRODUODENOSCOPY (EGD)  WITH PROPOFOL ;  Surgeon: Luke Salaam, MD;  Location: [facility] ENDOSCOPY;  Service: Gastroenterology;  Laterality: N/A;   HAND SURGERY     THUMB SURGERY   LAPAROSCOPIC OVARIAN CYSTECTOMY Left 03/02/2018   Procedure: LAPAROSCOPIC RIGHT OVARIAN CYSTECTOMY;  Surgeon: Alben Alma, MD;  Location: [facility] ORS;  Service: Gynecology;  Laterality: Left;   LAPAROSCOPIC OVARIAN CYSTECTOMY Left 08/30/2019   Procedure: LAPAROSCOPIC OVARIAN CYSTECTOMY;  Surgeon: Alben Alma, MD;  Location: [facility] ORS;  Service: Gynecology;  Laterality: Left;   LEEP     LEEP     PERINEOPLASTY N/A 03/02/2018   Procedure: PERINEORRHAPHY;  Surgeon: Alben Alma, MD;  Location: [facility] ORS;  Service: Gynecology;  Laterality: N/A;   RECTOCELE REPAIR N/A 03/02/2018   Procedure: POSTERIOR REPAIR (RECTOCELE);  Surgeon: Alben Alma, MD;  Location: [facility] ORS;  Service: Gynecology;  Laterality: N/A;   VENTRAL HERNIA REPAIR N/A 03/19/2017   Procedure: HERNIA REPAIR VENTRAL ADULT;  Surgeon: Claudia Cuff, MD;  Location: [facility] ORS;  Service: General;  Laterality: N/A;    Physical Exam   Triage Vital Signs: ED Triage Vitals  Encounter Vitals Group     BP 08/13/23 0833 132/72     Systolic BP Percentile --      Diastolic BP Percentile --      Pulse Rate 08/13/23 0833 86     Resp 08/13/23 0833 18  Temp 08/13/23 0833 98.2 F (36.8 C)     Temp Source 08/13/23 0833 Oral     SpO2 08/13/23 0833 100 %     Weight 08/13/23 0923 254 lb 13.6 oz (115.6 kg)     Height 08/13/23 0923 6\' 1"  (1.854 m)     Head Circumference --      Peak Flow --      Pain Score 08/13/23 0835 6     Pain Loc --      Pain Education --      Exclude from Growth Chart --     Most recent vital signs: Vitals:   08/13/23 0833 08/13/23 1252  BP: 132/72 130/70  Pulse: 86 80  Resp: 18 18  Temp: 98.2 F (36.8 C) 98 F (36.7 C)  SpO2: 100% 100%    General: Awake, no distress.  CV:  Good peripheral perfusion.  Resp:  Normal effort.  Abd:  No  distention.  Soft with left lower quadrant tenderness Other:  No lymphadenopathy   ED Results / Procedures / Treatments   Labs (all labs ordered are listed, but only abnormal results are displayed) Labs Reviewed  URINALYSIS, ROUTINE W REFLEX MICROSCOPIC - Abnormal; Notable for the following components:      Result Value   Color, Urine AMBER (*)    APPearance HAZY (*)    Protein, ur 30 (*)    Bacteria, UA FEW (*)    All other components within normal limits     EKG    RADIOLOGY Ultrasound interpreted by me, small left ovarian cyst.  Radiology report reviewed, no signs of torsion   PROCEDURES:  Procedures   MEDICATIONS ORDERED IN ED: Medications  ibuprofen  (ADVIL ) tablet 600 mg (has no administration in time range)     IMPRESSION / MDM / ASSESSMENT AND PLAN / ED COURSE  I reviewed the triage vital signs and the nursing notes.  DDx: Cystitis, ovarian cyst, ovarian torsion, functional pain, constipation  Patient's presentation is most consistent with acute presentation with potential threat to life or bodily function.  Patient presents with left lower quadrant abdominal pain with history of ovarian cysts.  Will obtain ultrasound.  Declines pain medicine currently.  ----------------------------------------- 12:59 PM on 08/13/2023 ----------------------------------------- Pain still reasonably controlled.  Request ibuprofen  only.  Low risk for torsion with small cyst.  She has had these before, comfortable with outpatient follow-up.       FINAL CLINICAL IMPRESSION(S) / ED DIAGNOSES   Final diagnoses:  Hemorrhagic ovarian cyst     Rx / DC Orders   ED Discharge Orders     None        Note:  This document was prepared using Dragon voice recognition software and may include unintentional dictation errors.   Jacquie Maudlin, MD 08/13/23 1259

## 2023-08-13 NOTE — ED Notes (Signed)
 See triage note  Presents with left lower abd pain  States she has a hx of ovarian cyst  and this feels the same  Afebrile on arrival

## 2023-08-13 NOTE — ED Triage Notes (Signed)
 Patient states left lower pelvic pain, history of hemorrhagic cysts.

## 2023-08-13 NOTE — Telephone Encounter (Signed)
 Pt scheduled appt via mychart for today, @ 12pm with Dr. Malissa Se for abdomen pain on left side. Spoke to pt, pt states she was advised to visit the ER. Pt states she's currently at the ER now. See hosp notes for further review. Call back # [phone]

## 2023-08-18 ENCOUNTER — Ambulatory Visit: Payer: Self-pay

## 2023-08-18 ENCOUNTER — Ambulatory Visit: Admitting: Professional Counselor

## 2023-08-18 NOTE — Telephone Encounter (Signed)
 Aware, will see her then  Agree with UC/ER precautions

## 2023-08-18 NOTE — Telephone Encounter (Signed)
 Chief Complaint: abdominal pain Symptoms: abdominal pain, bloating Frequency: since last Thursday, hx of ovarian cysts Pertinent Negatives: Patient denies fever, vomiting, vaginal bleeding, bloody stools, dizziness, weakness, dark urine, urinating less, urinary symptoms Disposition: [] ED /[] Urgent Care (no appt availability in office) / [x] Appointment(In office/virtual)/ []  [place]/ [] Home Care/ [] Refused Recommended Disposition /[] [facility]/ []  Follow-up with PCP Additional Notes: Pt reports 6/10 L-sided abdominal pain that radiates to her groin. Pt was seen in the ED last Thursday and diagnosed with an ovarian cyst which she has a hx of. Pt reports she did not have a good experience in the ED and prefers not to return there at this time. Pt endorses some watery/mucus diarrhea but endorses hx of IBS. Pt denies vomiting but took a Zofran  last night for nausea. Pt endorses bloating and decreased appetite. Pt reports she is drinking fluids often but has been eating less. Pt has taken Tylenol  and ibuprofen  which she states "dull" the pain. RN scheduled pt for tomorrow at 0930 for a hospital follow-up. Pt agreeable to this plan. RN advised pt to continue to push fluids, bland foods, and alterate Tylenol  and ibuprofen  while watching for worsening. RN advised pt if she develops severe, worsening pain, vomiting, fever, bleeding to go to the ED. Pt verbalized understanding.    Copied from CRM [id number]. Topic: Clinical - Red Word Triage >> Aug 18, 2023 11:00 AM Annelle Kiel wrote: Red Word that prompted transfer to Nurse Triage: patient is still having pain in her stomach she went to the er and was told she can be prescribed pain medication but she doesn't feel like that would be a help Reason for Disposition  [1] MILD pain (e.g., does not interfere with normal activities) AND [2] pain comes and goes (cramps) AND [3] present > 48 hours  (Exception: This same abdominal pain is a chronic  symptom recurrent or ongoing AND present > 4 weeks.)  Answer Assessment - Initial Assessment Questions 1. LOCATION: "Where does it hurt?"      L-sided abdominal pain 2. RADIATION: "Does the pain shoot anywhere else?" (e.g., chest, back)     "Radiating into my groin area and muscles" 3. ONSET: "When did the pain begin?" (e.g., minutes, hours or days ago)      Went to the ED last Thursday for abdominal pain, hx w/ ovarian cyst 5. PATTERN "Does the pain come and go, or is it constant?"    - If it comes and goes: "How long does it last?" "Do you have pain now?"     (Note: Comes and goes means the pain is intermittent. It goes away completely between bouts.)    - If constant: "Is it getting better, staying the same, or getting worse?"      (Note: Constant means the pain never goes away completely; most serious pain is constant and gets worse.)      Constant with intermittent worsening  6. SEVERITY: "How bad is the pain?"  (e.g., Scale 1-10; mild, moderate, or severe)    - MILD (1-3): Doesn't interfere with normal activities, abdomen soft and not tender to touch.     - MODERATE (4-7): Interferes with normal activities or awakens from sleep, abdomen tender to touch.     - SEVERE (8-10): Excruciating pain, doubled over, unable to do any normal activities.       6/10 constantly, "throbs to an 8" 7. RECURRENT SYMPTOM: "Have you ever had this type of stomach pain before?" If Yes,  ask: "When was the last time?" and "What happened that time?"      Yes, ovarian cysts since high school but the pain is more frequent now 8. CAUSE: "What do you think is causing the stomach pain?"     Ovarian cyst 9. RELIEVING/AGGRAVATING FACTORS: "What makes it better or worse?" (e.g., antacids, bending or twisting motion, bowel movement)     Feels better when she presses her L side  10. OTHER SYMPTOMS: "Do you have any other symptoms?" (e.g., back pain, diarrhea, fever, urination pain, vomiting)       Went to the ED last  Thursday, pain started to get better. Sunday pain was 2-3/10. Pt felt bloated, sore, "not feeling great." Yesterday pt states her pain wasn't "too bad" but states she has decreased appetite and does not feel like herself. Pt states now she is not feeling the best and pain has worsened on the L side. Last night and this AM pt reports diarrhea. Endorses chills, no fever. States ibuprofen  and Tylenol  "dulls" the pain. Denies vomiting but states she was nauseous last night and took a Zofran   Diarrhea is very watery/mucous, hx of IBS. Pt states she is drinking fluids but has decreased appetite. Pt states she feels full quickly w/ bloating  Protocols used: Abdominal Pain - Female-A-AH

## 2023-08-19 ENCOUNTER — Ambulatory Visit: Payer: Self-pay | Admitting: Family Medicine

## 2023-08-19 ENCOUNTER — Encounter: Payer: Self-pay | Admitting: Family Medicine

## 2023-08-19 ENCOUNTER — Ambulatory Visit: Admitting: Family Medicine

## 2023-08-19 VITALS — HR 74 | Temp 98.2°F | Ht 73.0 in | Wt 236.0 lb

## 2023-08-19 DIAGNOSIS — N83202 Unspecified ovarian cyst, left side: Secondary | ICD-10-CM

## 2023-08-19 DIAGNOSIS — R110 Nausea: Secondary | ICD-10-CM

## 2023-08-19 DIAGNOSIS — R140 Abdominal distension (gaseous): Secondary | ICD-10-CM | POA: Diagnosis not present

## 2023-08-19 DIAGNOSIS — R1084 Generalized abdominal pain: Secondary | ICD-10-CM | POA: Diagnosis not present

## 2023-08-19 DIAGNOSIS — G43709 Chronic migraine without aura, not intractable, without status migrainosus: Secondary | ICD-10-CM

## 2023-08-19 DIAGNOSIS — R61 Generalized hyperhidrosis: Secondary | ICD-10-CM | POA: Diagnosis not present

## 2023-08-19 LAB — CBC WITH DIFFERENTIAL/PLATELET
Basophils Absolute: 0.1 10*3/uL (ref 0.0–0.1)
Basophils Relative: 1.1 % (ref 0.0–3.0)
Eosinophils Absolute: 0.1 10*3/uL (ref 0.0–0.7)
Eosinophils Relative: 1.9 % (ref 0.0–5.0)
HCT: 40.9 % (ref 36.0–46.0)
Hemoglobin: 14.2 g/dL (ref 12.0–15.0)
Lymphocytes Relative: 29.2 % (ref 12.0–46.0)
Lymphs Abs: 2 10*3/uL (ref 0.7–4.0)
MCHC: 34.7 g/dL (ref 30.0–36.0)
MCV: 90.8 fl (ref 78.0–100.0)
Monocytes Absolute: 0.5 10*3/uL (ref 0.1–1.0)
Monocytes Relative: 6.5 % (ref 3.0–12.0)
Neutro Abs: 4.3 10*3/uL (ref 1.4–7.7)
Neutrophils Relative %: 61.3 % (ref 43.0–77.0)
Platelets: 214 10*3/uL (ref 150.0–400.0)
RBC: 4.5 Mil/uL (ref 3.87–5.11)
RDW: 13 % (ref 11.5–15.5)
WBC: 7 10*3/uL (ref 4.0–10.5)

## 2023-08-19 LAB — FOLLICLE STIMULATING HORMONE: FSH: 3.3 m[IU]/mL

## 2023-08-19 LAB — COMPREHENSIVE METABOLIC PANEL WITH GFR
ALT: 12 U/L (ref 0–35)
AST: 14 U/L (ref 0–37)
Albumin: 4.9 g/dL (ref 3.5–5.2)
Alkaline Phosphatase: 49 U/L (ref 39–117)
BUN: 13 mg/dL (ref 6–23)
CO2: 22 meq/L (ref 19–32)
Calcium: 9.2 mg/dL (ref 8.4–10.5)
Chloride: 108 meq/L (ref 96–112)
Creatinine, Ser: 1.05 mg/dL (ref 0.40–1.20)
GFR: 66.41 mL/min (ref >60.00)
Glucose, Bld: 92 mg/dL (ref 70–99)
Potassium: 4 meq/L (ref 3.5–5.1)
Sodium: 138 meq/L (ref 135–145)
Total Bilirubin: 0.9 mg/dL (ref 0.2–1.2)
Total Protein: 7.1 g/dL (ref 6.0–8.3)

## 2023-08-19 LAB — LIPASE: Lipase: 40 U/L (ref 11.0–59.0)

## 2023-08-19 LAB — LUTEINIZING HORMONE: LH: 2.96 m[IU]/mL

## 2023-08-19 NOTE — Assessment & Plan Note (Signed)
 Unsure if this is from ovarian cyst , IBS or other  Some tenderness on exam  Labs ordered

## 2023-08-19 NOTE — Assessment & Plan Note (Addendum)
 Left sided/hemorrhagic and painful  2.7 cm on US  Seen in ER  Reviewed hospital records, lab results and studies in detail   Not clinically improving  Taking ibuprofen  (urged caution with abdominal symptoms and nausea)   Urgent referral made to gyn

## 2023-08-19 NOTE — Assessment & Plan Note (Signed)
 This may be due to pain of ovarian cyst  Also some bloating and upper abd pain   Lab today

## 2023-08-19 NOTE — Assessment & Plan Note (Signed)
 Ongoing  May be related to IBS or ovarian cyst  Some mild tenderness on exam  Lab today

## 2023-08-19 NOTE — Assessment & Plan Note (Signed)
 Suspect perimenopause based on age  Had partial hysterectomy   FSH and LH drawn today

## 2023-08-19 NOTE — Progress Notes (Signed)
 Subjective:    Patient ID: Caitlyn Harris, female    DOB: [birth year], 43 y.o.   MRN: [id number]  HPI  Wt Readings from Last 3 Encounters:  08/19/23 236 lb (107 kg)  08/13/23 254 lb 13.6 oz (115.6 kg)  03/10/23 250 lb (113.4 kg)   31.14 kg/m  Vitals:   08/19/23 0934  Pulse: 74  Temp: 98.2 F (36.8 C)  SpO2: 99%    Pt presents for ER visit follow up for abd/pelvic pain  Headaches today  Low appetite   Seen for colicky left lower pain   Urinalysis clear with few bacteria   US  pelvic US  PELVIC COMPLETE W TRANSVAGINAL AND TORSION R/O Result Date: 08/13/2023 CLINICAL DATA:  Acute onset left lower quadrant abdominal pain EXAM: TRANSABDOMINAL AND TRANSVAGINAL ULTRASOUND OF PELVIS DOPPLER ULTRASOUND OF OVARIES TECHNIQUE: Both transabdominal and transvaginal ultrasound examinations of the pelvis were performed. Transabdominal technique was performed for global imaging of the pelvis including uterus, ovaries, adnexal regions, and pelvic cul-de-sac. It was necessary to proceed with endovaginal exam following the transabdominal exam to visualize the ovaries. Color and duplex Doppler ultrasound was utilized to evaluate blood flow to the ovaries. COMPARISON:  CT abdomen and pelvis dated 11/24/2021 FINDINGS: Uterus Status post hysterectomy. Right ovary Measurements: 4.3 x 4.1 x 3.6 cm = volume: 32.7 mL. Contains a simple cyst measuring 3.3 x 3.2 x 2.7 cm Left ovary Measurements: 3.8 x 3.0 x 2.9 cm = volume: 16.8 mL. Contains a complex cyst with internal lacy reticular echogenicities and peripheral mild hypoechogenicity measuring 2.7 x 2.5 x 2.5 cm. Pulsed Doppler evaluation of both ovaries demonstrates normal low-resistance arterial and venous waveforms. Other findings No abnormal free fluid. IMPRESSION: 1. No evidence of ovarian torsion. 2. Complex left ovarian cyst measuring 2.7 cm, likely a hemorrhagic cyst. No follow-up imaging recommended. 3. Status post hysterectomy. Electronically Signed    By: Limin  Xu M.D.   On: 08/13/2023 12:01    Complex left ovarian cyst 2.7 cm / likely hemorrhagic cyst   Advised to continue ibuprofen    CT in 2023 noted  IMPRESSION: 1.  Hepatic steatosis.   2. Bowel loops are normal in caliber. No evidence of colitis or diverticulitis.   3.  Stable cyst in the left adnexal region.   4.  No CT evidence of acute abdominal/pelvic process   Has had to re schedule gyn appointment when her mother had died   Had partial hysterectomy in the past   Pain keeps her up at night  It improved by Sunday/ now worse since Tuesday   No appetite Feels bloated and nauseated  Has lost some weight   Has been cutting carbs    Headaches sees neuro Dr Dotty Gee  History of migraine  Did botox  in past - not effective   Ubrelvy - needs refill / planning neuro appointment -not really helping   Ibuprofen  -now / last dose Thursday  Ajovy   Topamax  100 mg bid   Sertraline  for mood  Adderall xr in am for ADHD  Started some vitamin D  with K  ? Perimenopause Night sweats    Lab Results  Component Value Date   WBC 7.0 08/19/2023   HGB 14.2 08/19/2023   HCT 40.9 08/19/2023   MCV 90.8 08/19/2023   PLT 214.0 08/19/2023   Lab Results  Component Value Date   NA 138 08/19/2023   K 4.0 08/19/2023   CO2 22 08/19/2023   GLUCOSE 92 08/19/2023   BUN 13 08/19/2023  CREATININE 1.05 08/19/2023   CALCIUM 9.2 08/19/2023   GFR 66.41 08/19/2023   GFRNONAA >60 08/27/2022      Patient Active Problem List   Diagnosis Date Noted   Night sweats 08/19/2023   MDD (major depressive disorder), recurrent episode, mild (HCC) 08/07/2023   Vaginal discharge 03/10/2023   Diabetes mellitus screening 03/10/2023   Attention deficit hyperactivity disorder (ADHD), combined type 07/23/2022   High risk medication use 07/23/2022   Stress incontinence 02/26/2022   Adenomatous polyp of colon    Dyspepsia    Low back pain 12/19/2021   MDD (major depressive disorder),  recurrent severe, without psychosis (HCC) 11/15/2021   Bereavement 11/15/2021   Anxiety 08/21/2021   Attention and concentration deficit 08/21/2021   Cervical lymphadenopathy 07/09/2021   Nausea 07/07/2021   Abdominal pain 06/26/2021   Bloating 06/26/2021   Heartburn 06/26/2021   Obesity (BMI 30-39.9) 04/16/2020   Ovarian cyst, left 08/30/2019   Hypertriglyceridemia 06/11/2018   Endometriosis 02/01/2018   Chronic female pelvic pain 02/01/2018   Rectocele 01/18/2018   Insomnia 08/25/2017   Cystocele, midline 08/25/2017   Chronic migraine without aura without status migrainosus, not intractable 02/04/2016   Depression 02/04/2016   GAD (generalized anxiety disorder) 02/04/2016   Vitamin D  deficiency, unspecified 02/04/2016   Past Medical History:  Diagnosis Date   Anxiety    Depression    Endometriosis    GERD (gastroesophageal reflux disease)    OCC   History of PCOS    Hyperlipidemia    Migraine    Seizures (HCC)    FEBRILE AS A BABY   Past Surgical History:  Procedure Laterality Date   ABDOMINAL HYSTERECTOMY     CERVICAL BIOPSY  W/ LOOP ELECTRODE EXCISION     COLONOSCOPY WITH PROPOFOL  N/A 01/16/2022   Procedure: COLONOSCOPY WITH PROPOFOL ;  Surgeon: Luke Salaam, MD;  Location: [facility] ENDOSCOPY;  Service: Gastroenterology;  Laterality: N/A;   ECTOPIC PREGNANCY SURGERY     endometirosis     ESOPHAGOGASTRODUODENOSCOPY (EGD) WITH PROPOFOL  N/A 01/16/2022   Procedure: ESOPHAGOGASTRODUODENOSCOPY (EGD) WITH PROPOFOL ;  Surgeon: Luke Salaam, MD;  Location: [facility] ENDOSCOPY;  Service: Gastroenterology;  Laterality: N/A;   HAND SURGERY     THUMB SURGERY   LAPAROSCOPIC OVARIAN CYSTECTOMY Left 03/02/2018   Procedure: LAPAROSCOPIC RIGHT OVARIAN CYSTECTOMY;  Surgeon: Alben Alma, MD;  Location: [facility] ORS;  Service: Gynecology;  Laterality: Left;   LAPAROSCOPIC OVARIAN CYSTECTOMY Left 08/30/2019   Procedure: LAPAROSCOPIC OVARIAN CYSTECTOMY;  Surgeon: Alben Alma, MD;  Location:  [facility] ORS;  Service: Gynecology;  Laterality: Left;   LEEP     LEEP     PERINEOPLASTY N/A 03/02/2018   Procedure: PERINEORRHAPHY;  Surgeon: Alben Alma, MD;  Location: [facility] ORS;  Service: Gynecology;  Laterality: N/A;   RECTOCELE REPAIR N/A 03/02/2018   Procedure: POSTERIOR REPAIR (RECTOCELE);  Surgeon: Alben Alma, MD;  Location: [facility] ORS;  Service: Gynecology;  Laterality: N/A;   VENTRAL HERNIA REPAIR N/A 03/19/2017   Procedure: HERNIA REPAIR VENTRAL ADULT;  Surgeon: Claudia Cuff, MD;  Location: [facility] ORS;  Service: General;  Laterality: N/A;   Social History   Tobacco Use   Smoking status: Former    Current packs/day: 0.00    Types: Cigarettes    Start date: 03/12/2002    Quit date: 03/12/2009    Years since quitting: 14.4   Smokeless tobacco: Never   Tobacco comments:    1 PACK EVERY 2 WEEKS  Vaping Use   Vaping status: Never  Used  Substance Use Topics   Alcohol use: Not Currently    Comment: RARE   Drug use: No   Family History  Problem Relation Age of Onset   Hypertension Mother    Diabetes Mother    CVA Mother    Chronic Renal Failure Mother    Renal cancer Father    Hypertension Father    Post-traumatic stress disorder Brother    Drug abuse Maternal Uncle    Alcohol abuse Maternal Uncle    Colon cancer Paternal Aunt    Bipolar disorder Half-Sister    Bipolar disorder Niece    Suicidality Other    Allergies  Allergen Reactions   Tape     Low sensitivity to tape, ok with Tegaderm and paper tape    Current Outpatient Medications on File Prior to Visit  Medication Sig Dispense Refill   acetaminophen  (TYLENOL ) 500 MG tablet Take 1,000 mg by mouth every 6 (six) hours as needed for moderate pain or headache.     amphetamine -dextroamphetamine  (ADDERALL XR) 30 MG 24 hr capsule Take 1 capsule (30 mg total) by mouth every morning. 30 capsule 0   amphetamine -dextroamphetamine  (ADDERALL) 10 MG tablet Take 1 tablet (10 mg total) by mouth daily after lunch.  30 tablet 0   cholecalciferol (VITAMIN D3) 25 MCG (1000 UNIT) tablet Take 5,000 Units by mouth daily.     clonazePAM  (KLONOPIN ) 0.5 MG tablet Take 0.5-1 tablets (0.25-0.5 mg total) by mouth daily as needed for anxiety. Please limit use 10 tablet 0   Fremanezumab -vfrm (AJOVY ) 225 MG/1.5ML SOAJ Inject 225 mg into the skin every 28 (twenty-eight) days. 1.68 mL 11   hydrOXYzine  (VISTARIL ) 25 MG capsule Take 1-2 capsules (25-50 mg total) by mouth at bedtime as needed. For sleep and anxiety 180 capsule 0   ibuprofen  (ADVIL ) 400 MG tablet Take 400 mg by mouth every 6 (six) hours as needed.     omeprazole  (PRILOSEC) 20 MG capsule TAKE 1 CAPSULE (20 MG TOTAL) BY MOUTH 2 (TWO) TIMES DAILY BEFORE A MEAL. 180 capsule 3   ondansetron  (ZOFRAN -ODT) 8 MG disintegrating tablet Take 1 tablet (8 mg total) by mouth every 8 (eight) hours as needed for nausea or vomiting. 20 tablet 0   sertraline  (ZOLOFT ) 50 MG tablet Take 1.5 tablets (75 mg total) by mouth daily after supper. 135 tablet 0   SUMAtriptan  (IMITREX ) 100 MG tablet MAY REPEAT IN 2 HOURS IF HEADACHE PERSISTS OR RECURS. 30 tablet 3   tiZANidine  (ZANAFLEX ) 4 MG capsule TAKE 1 CAPSULE BY MOUTH 3 TIMES DAILY AS NEEDED FOR MUSCLE SPASMS. 30 capsule 3   topiramate  (TOPAMAX ) 100 MG tablet TAKE 1 TABLET BY MOUTH TWICE A DAY 180 tablet 1   Ubrogepant  (UBRELVY ) 100 MG TABS Take 1 tablet (100 mg total) by mouth as needed. May repeat after 2 hours.  Maximum 2 tablets in 24 hours. 16 tablet 11   zolpidem  (AMBIEN ) 5 MG tablet Take 0.5-1 tablets (2.5-5 mg total) by mouth at bedtime as needed for sleep. 30 tablet 0   No current facility-administered medications on file prior to visit.    Review of Systems  Constitutional:  Positive for fatigue. Negative for activity change, appetite change, fever and unexpected weight change.  HENT:  Negative for congestion, ear pain, rhinorrhea, sinus pressure and sore throat.   Eyes:  Negative for pain, redness and visual disturbance.   Respiratory:  Negative for cough, shortness of breath and wheezing.   Cardiovascular:  Negative for chest pain and  palpitations.  Gastrointestinal:  Positive for abdominal distention, abdominal pain and nausea. Negative for blood in stool, constipation, diarrhea and vomiting.  Endocrine: Negative for polydipsia and polyuria.  Genitourinary:  Positive for pelvic pain. Negative for dysuria, frequency and urgency.  Musculoskeletal:  Negative for arthralgias, back pain and myalgias.  Skin:  Negative for pallor and rash.  Allergic/Immunologic: Negative for environmental allergies.  Neurological:  Positive for headaches. Negative for dizziness and syncope.  Hematological:  Negative for adenopathy. Does not bruise/bleed easily.  Psychiatric/Behavioral:  Negative for decreased concentration and dysphoric mood. The patient is not nervous/anxious.        Objective:   Physical Exam Constitutional:      General: She is not in acute distress.    Appearance: Normal appearance. She is well-developed. She is obese. She is not ill-appearing or diaphoretic.  HENT:     Head: Normocephalic and atraumatic.  Eyes:     Conjunctiva/sclera: Conjunctivae normal.     Pupils: Pupils are equal, round, and reactive to light.  Neck:     Thyroid : No thyromegaly.     Vascular: No carotid bruit or JVD.  Cardiovascular:     Rate and Rhythm: Normal rate and regular rhythm.     Heart sounds: Normal heart sounds.     No gallop.  Pulmonary:     Effort: Pulmonary effort is normal. No respiratory distress.     Breath sounds: Normal breath sounds. No stridor. No wheezing, rhonchi or rales.  Abdominal:     General: Abdomen is protuberant. Bowel sounds are normal. There is no distension or abdominal bruit.     Palpations: Abdomen is soft. There is no fluid wave, hepatomegaly, splenomegaly, mass or pulsatile mass.     Tenderness: There is abdominal tenderness in the epigastric area, left upper quadrant and left lower  quadrant. There is no right CVA tenderness, left CVA tenderness, guarding or rebound. Negative signs include Murphy's sign and McBurney's sign.  Musculoskeletal:     Cervical back: Normal range of motion and neck supple.     Right lower leg: No edema.     Left lower leg: No edema.  Lymphadenopathy:     Cervical: No cervical adenopathy.  Skin:    General: Skin is warm and dry.     Coloration: Skin is not pale.     Findings: No rash.  Neurological:     Mental Status: She is alert.     Coordination: Coordination normal.     Deep Tendon Reflexes: Reflexes are normal and symmetric. Reflexes normal.  Psychiatric:        Mood and Affect: Mood normal.           Assessment & Plan:   Problem List Items Addressed This Visit       Cardiovascular and Mediastinum   Chronic migraine without aura without status migrainosus, not intractable   Sees neurology Dr Dotty Gee  Does have headache today  Currently takes Topamax  100 mg bid Ajovy   Ubrevly prn (not helping much)  Perimenopause may add to this No longer getting botox          Endocrine   Ovarian cyst, left - Primary   Left sided/hemorrhagic and painful  2.7 cm on US  Seen in ER  Reviewed hospital records, lab results and studies in detail   Not clinically improving  Taking ibuprofen  (urged caution with abdominal symptoms and nausea)   Urgent referral made to gyn      Relevant Orders   Ambulatory referral  to Gynecology     Other   Night sweats   Suspect perimenopause based on age  Had partial hysterectomy   FSH and LH drawn today      Relevant Orders   Follicle Stimulating Hormone (Completed)   Luteinizing hormone (Completed)   Nausea   This may be due to pain of ovarian cyst  Also some bloating and upper abd pain   Lab today       Relevant Orders   Comprehensive metabolic panel with GFR (Completed)   CBC with Differential/Platelet (Completed)   Lipase (Completed)   Bloating   Ongoing  May be related to  IBS or ovarian cyst  Some mild tenderness on exam  Lab today      Relevant Orders   Comprehensive metabolic panel with GFR (Completed)   CBC with Differential/Platelet (Completed)   Lipase (Completed)   Abdominal pain   Unsure if this is from ovarian cyst , IBS or other  Some tenderness on exam  Labs ordered       Relevant Orders   Comprehensive metabolic panel with GFR (Completed)   CBC with Differential/Platelet (Completed)   Lipase (Completed)

## 2023-08-19 NOTE — Assessment & Plan Note (Signed)
 Sees neurology Dr Dotty Gee  Does have headache today  Currently takes Topamax  100 mg bid Ajovy   Ubrevly prn (not helping much)  Perimenopause may add to this No longer getting botox 

## 2023-08-19 NOTE — Patient Instructions (Addendum)
 I put the referral in for gyn  You can go ahead and call Dr Jodeane Mulligan office  Otherwise  Please let us  know if you don't hear in 1-2 weeks to set that up   Labs today for nausea/hormones / bloating  as well

## 2023-09-15 ENCOUNTER — Encounter: Payer: Self-pay | Admitting: Family Medicine

## 2023-09-15 ENCOUNTER — Ambulatory Visit: Admitting: Family Medicine

## 2023-09-15 VITALS — BP 134/88 | HR 78 | Wt 237.0 lb

## 2023-09-15 DIAGNOSIS — K469 Unspecified abdominal hernia without obstruction or gangrene: Secondary | ICD-10-CM | POA: Diagnosis not present

## 2023-09-15 DIAGNOSIS — N83202 Unspecified ovarian cyst, left side: Secondary | ICD-10-CM

## 2023-09-15 DIAGNOSIS — N393 Stress incontinence (female) (male): Secondary | ICD-10-CM | POA: Diagnosis not present

## 2023-09-15 MED ORDER — NORETHIN-ETH ESTRAD-FE BIPHAS 1 MG-10 MCG / 10 MCG PO TABS: 1.0000 | ORAL_TABLET | Freq: Every day | ORAL | 3 refills | Status: DC

## 2023-09-15 NOTE — Progress Notes (Unsigned)
 RGYN presents for problem visit today.   Complains of severe pelvic pain notes pain is getting worse will last for hours and days.  Hx of cyst feels they have been frequent.had U/S 08/13/23.  Wants to discuss management plan/options.

## 2023-09-15 NOTE — Progress Notes (Unsigned)
    Subjective:    Patient ID: Caitlyn Harris is a 43 y.o. female presenting with Pelvic Pain  on 09/15/2023  HPI: Still having some pelvic fullness. Has h/o multiple cysts on her ovaries. She has noted some h/o ovulatory pain. Pain would last days and noted it is more frequent and lasted x 1 week in the last month. Pain is severe and she is miserable. Pain in on left, pain is worse with standing. Then had some nausea and required Zofran . Pain started on Thursday. Stopped and then started again after several days. Has h/o left ruptured ectopic. Has had left ovarian cystectomy.  Feels tired of this recurrence. Does not want to be on COC's due to weight gain.  Review of Systems  Constitutional:  Negative for chills and fever.  Respiratory:  Negative for shortness of breath.   Cardiovascular:  Negative for chest pain.  Gastrointestinal:  Negative for abdominal pain, nausea and vomiting.  Genitourinary:  Negative for dysuria.  Skin:  Negative for rash.      Objective:    BP 134/88   Pulse 78   Wt 237 lb (107.5 kg)   BMI 31.27 kg/m  Physical Exam Exam conducted with a chaperone present.  Constitutional:      General: She is not in acute distress.    Appearance: She is well-developed.  HENT:     Head: Normocephalic and atraumatic.  Eyes:     General: No scleral icterus. Cardiovascular:     Rate and Rhythm: Normal rate.  Pulmonary:     Effort: Pulmonary effort is normal.  Abdominal:     Palpations: Abdomen is soft.  Musculoskeletal:     Cervical back: Neck supple.  Skin:    General: Skin is warm and dry.  Neurological:     Mental Status: She is alert and oriented to person, place, and time.         Assessment & Plan:   Total time in review of prior notes, pathology, labs, history taking, review with patient, exam, note writing, discussion of options, plan for next steps, alternatives and risks of treatment: *** minutes.  No follow-ups on file.  Granville Layer,  MD 09/15/2023 1:52 PM

## 2023-09-16 DIAGNOSIS — K469 Unspecified abdominal hernia without obstruction or gangrene: Secondary | ICD-10-CM | POA: Insufficient documentation

## 2023-09-16 NOTE — Assessment & Plan Note (Signed)
 Return to Uro/GYN for definitive repair.

## 2023-09-16 NOTE — Assessment & Plan Note (Addendum)
 Initially interested in BSO due to recurrence and chronic pain. After thoughtful discussion of R/B including risks of multiple surgeries, hernia repair and the decrease in life expectancy related to removal of ovaries at a young age and possible need for HRT/low dose COC's for hormone protection with surgical menopause, she elected to trial a low dose COC to try to keep her ovaries quiet and to prevent Mittleschmerz and ovulatory cysts. Complex cysts that appear hemorrhagic are not worrisome.

## 2023-09-16 NOTE — Assessment & Plan Note (Signed)
 Return to Uro-GYN.

## 2023-09-23 ENCOUNTER — Ambulatory Visit (INDEPENDENT_AMBULATORY_CARE_PROVIDER_SITE_OTHER): Admitting: Professional Counselor

## 2023-09-23 DIAGNOSIS — F902 Attention-deficit hyperactivity disorder, combined type: Secondary | ICD-10-CM

## 2023-09-23 DIAGNOSIS — F411 Generalized anxiety disorder: Secondary | ICD-10-CM | POA: Diagnosis not present

## 2023-09-23 DIAGNOSIS — Z634 Disappearance and death of family member: Secondary | ICD-10-CM | POA: Diagnosis not present

## 2023-09-23 DIAGNOSIS — F3341 Major depressive disorder, recurrent, in partial remission: Secondary | ICD-10-CM

## 2023-09-23 NOTE — Progress Notes (Signed)
 THERAPIST PROGRESS NOTE  Virtual Visit via Video Note  I connected with Caitlyn Harris on 09/24/23 at  4:00 PM EDT by a video enabled telemedicine application and verified that I am speaking with the correct person using two identifiers.  Location: Patient: Home Provider: Office   I discussed the limitations of evaluation and management by telemedicine and the availability of in person appointments. The patient expressed understanding and agreed to proceed.  I discussed the assessment and treatment plan with the patient. The patient was provided an opportunity to ask questions and all were answered. The patient agreed with the plan and demonstrated an understanding of the instructions.   The patient was advised to call back or seek an in-person evaluation if the symptoms worsen or if the condition fails to improve as anticipated.  I provided 48 minutes of non-face-to-face time during this encounter. Caitlyn Harris, [facility]  Session Time: 4:03 PM - 4:51 PM   Participation Level: Active  Behavioral Response: Casual, Alert, Dysphoric  Type of Therapy: Individual Therapy  Treatment Goals addressed: Active OP Depression  LTG: I feel like I'm struggling with my day-to-day tasks, like the laundry, the vacuuming. Those things pile up and they just become overwhelming. My husband can't stand that stuff. I want to be more present. It's like I'm putting on for everyone else.    Start:  09/23/23    Expected End:  09/21/24     STG: To improve productivity AEB creating and following a daily routine and using coping mechanisms to stay on track over the next 12 weeks.   STG: To improve boundary setting AEB identifying limitations and engaging in self-care weekly over the next 12 weeks.   STG: To improve mindset AEB identifying and restructuring maladaptive patterns of thinking and practicing daily affirmations over the next 12 weeks.    ProgressTowards Goals: Initial  Interventions:  Motivational Interviewing and Supportive  Summary: Caitlyn Harris is a 43 y.o. female who presents with a history of anxiety, depression, ADHD, and bereavement. She appeared alert and oriented x5. Today. She shared updates from the last three months from her initial CCA. She celebrated her mother's first birthday since passing. 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 She expressed enjoyment working with special needs and gratitude for the community support they have received to get this started. [name] engaged in creating her treatment plan. She was able to identify during discussion some ways that she can go ahead and start working towards her short-term goals.   Therapist Response: Conducted session with [name]. Began session with check-in/update since previous session. Utilized empathetic and reflective listening. Used open-ended questions to facilitate discussion and summarized Jenni's thoughts/feelings. Developed treatment plan with input from [place] on current strengths, needs, and progress towards goals. Encouraged [name] to start with tips/coping mechanisms identified during discussion. Scheduled additional appointment and concluded session.   Suicidal/Homicidal: No  Plan: Return again in 2 weeks.  Diagnosis: Recurrent major depressive disorder, in partial remission (HCC)  GAD (generalized anxiety disorder)  Attention deficit hyperactivity disorder (ADHD), combined type  Bereavement  Collaboration of Care: Medication Management AEB chart review  Patient/Guardian was advised Release of Information must be obtained prior to any record release in order to collaborate their care with an outside provider. Patient/Guardian was advised if they have  not already done so to contact the registration  department to sign all necessary forms in order for us  to release information regarding their care.   Consent: Patient/Guardian gives verbal consent for treatment and assignment of benefits for services provided during this visit. Patient/Guardian expressed understanding and agreed to proceed.   Caitlyn Harris, [facility] 09/24/2023
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
## 2023-10-05 ENCOUNTER — Other Ambulatory Visit: Payer: Self-pay | Admitting: Family Medicine

## 2023-10-05 ENCOUNTER — Ambulatory Visit (INDEPENDENT_AMBULATORY_CARE_PROVIDER_SITE_OTHER): Admitting: Professional Counselor

## 2023-10-05 ENCOUNTER — Ambulatory Visit: Admitting: Neurology

## 2023-10-05 DIAGNOSIS — F902 Attention-deficit hyperactivity disorder, combined type: Secondary | ICD-10-CM | POA: Diagnosis not present

## 2023-10-05 DIAGNOSIS — F411 Generalized anxiety disorder: Secondary | ICD-10-CM

## 2023-10-05 DIAGNOSIS — F330 Major depressive disorder, recurrent, mild: Secondary | ICD-10-CM

## 2023-10-05 DIAGNOSIS — G43709 Chronic migraine without aura, not intractable, without status migrainosus: Secondary | ICD-10-CM

## 2023-10-05 DIAGNOSIS — Z634 Disappearance and death of family member: Secondary | ICD-10-CM | POA: Diagnosis not present

## 2023-10-05 NOTE — Progress Notes (Signed)
 THERAPIST PROGRESS NOTE  Virtual Visit via Video Note  I connected with Caitlyn Harris on 10/05/23 at  4:00 PM EDT by a video enabled telemedicine application and verified that I am speaking with the correct person using two identifiers.  Location: Patient: Home Provider: Remote office   I discussed the limitations of evaluation and management by telemedicine and the availability of in person appointments. The patient expressed understanding and agreed to proceed.   I discussed the assessment and treatment plan with the patient. The patient was provided an opportunity to ask questions and all were answered. The patient agreed with the plan and demonstrated an understanding of the instructions.   The patient was advised to call back or seek an in-person evaluation if the symptoms worsen or if the condition fails to improve as anticipated.  I provided 54 minutes of non-face-to-face time during this encounter. Caitlyn Harris, [facility]  Session Time: 4:00 PM - 4:54 PM  Participation Level: Active  Behavioral Response: Casual, Alert, Dysphoric  Type of Therapy: Individual Therapy  Treatment Goals addressed: Active OP Depression  LTG: I feel like I'm struggling with my day-to-day tasks, like the laundry, the vacuuming. Those things pile up and they just become overwhelming. My husband can't stand that stuff. I want to be more present. It's like I'm putting on for everyone else.    Start:  09/23/23    Expected End:  09/21/24     STG: To improve productivity AEB creating and following a daily routine and using coping mechanisms to stay on track over the next 12 weeks.   STG: To improve boundary setting AEB identifying limitations and engaging in self-care weekly over the next 12 weeks.   STG: To improve mindset AEB identifying and restructuring maladaptive patterns of thinking and practicing daily affirmations over the next 12 weeks.    ProgressTowards Goals:  Progressing  Interventions: CBT, Motivational Interviewing, and Supportive  Summary: [name] is a 43 y.o. female who presents with a history of anxiety, depression, ADHD, and bereavement. She appeared somber but oriented x5. She stated her great uncle passed away. She was happy they were able to visit him before he passed. However, it was sad because this was the last main member of her mother's family. Caitlyn Harris chose to discuss relationships and communication today. She noted a struggle with her sons. She shared current thoughts and feelings about this. Caitlyn Harris engaged in writing exercise. She was able to identify things within and outside of her control. She was receptive to input from Conservation officer, nature as well. She will try to use this between now and next session.  Therapist Response: Conducted session with Caitlyn Harris. Began session with check-in/update since previous session. Utilized empathetic and reflective listening. Used open-ended questions to facilitate discussion and summarized Jenni's thoughts/feelings. Explored thoughts around communication and emotion regulation, particularly in connection with sons. Engaged Jenni in writing exercise - donut/circles of control. Encouraged Caitlyn Harris to focus on things within her control. Scheduled additional appointment and concluded session.   Suicidal/Homicidal: No  Plan: Return again in 4 weeks.  Diagnosis: MDD (major depressive disorder), recurrent episode, mild (HCC)  GAD (generalized anxiety disorder)  Attention deficit hyperactivity disorder (ADHD), combined type  Bereavement  Collaboration of Care: Medication Management AEB chart review  Patient/Guardian was advised Release of Information must be obtained prior to any record release in order to collaborate their care with an outside provider. Patient/Guardian was advised if they have not already done so to contact the  registration department to sign all necessary forms in order for us  to release  information regarding their care.   Consent: Patient/Guardian gives verbal consent for treatment and assignment of benefits for services provided during this visit. Patient/Guardian expressed understanding and agreed to proceed.   Caitlyn Harris, [facility] 10/05/2023

## 2023-10-05 NOTE — Telephone Encounter (Signed)
 Last filled on 04/13/23 #180 tabs/ 1 refill  Last OV was a hospital f/u for abd problems on 08/19/23

## 2023-10-18 ENCOUNTER — Other Ambulatory Visit: Payer: Self-pay | Admitting: Psychiatry

## 2023-10-18 DIAGNOSIS — F411 Generalized anxiety disorder: Secondary | ICD-10-CM
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
## 2023-10-20 ENCOUNTER — Encounter: Payer: Self-pay | Admitting: Neurology

## 2023-10-20 ENCOUNTER — Ambulatory Visit: Admitting: Neurology

## 2023-10-20 ENCOUNTER — Other Ambulatory Visit: Payer: Self-pay | Admitting: Neurology

## 2023-10-20 VITALS — BP 133/88 | HR 82 | Ht 73.0 in | Wt 232.0 lb

## 2023-10-20 DIAGNOSIS — G44229 Chronic tension-type headache, not intractable: Secondary | ICD-10-CM

## 2023-10-20 DIAGNOSIS — G43109 Migraine with aura, not intractable, without status migrainosus: Secondary | ICD-10-CM

## 2023-10-20 MED ORDER — AJOVY 225 MG/1.5ML ~~LOC~~ SOAJ: 225.0000 mg | SUBCUTANEOUS | 11 refills | Status: AC

## 2023-10-20 MED ORDER — TIZANIDINE HCL 4 MG PO TABS: 4.0000 mg | ORAL_TABLET | Freq: Four times a day (QID) | ORAL | 5 refills | Status: AC | PRN

## 2023-10-20 MED ORDER — SUMATRIPTAN SUCCINATE 100 MG PO TABS: ORAL_TABLET | ORAL | 5 refills | Status: DC

## 2023-10-20 MED ORDER — ONDANSETRON 8 MG PO TBDP: 8.0000 mg | ORAL_TABLET | Freq: Three times a day (TID) | ORAL | 0 refills | Status: DC | PRN

## 2023-10-20 NOTE — Patient Instructions (Signed)
  Start Ajovy  every 28 days.   Take sumatriptan  at earliest onset of headache.  May repeat dose once in 2 hours if needed.  Maximum 2 tablets in 24 hours. Zofran  for nausea Tizanidine  for tension headaches Limit use of pain relievers to no more than 9 days out of the month.  These medications include acetaminophen , NSAIDs (ibuprofen /Advil /Motrin , naproxen/Aleve, triptans (Imitrex /sumatriptan ), Excedrin, and narcotics.  This will help reduce risk of rebound headaches. Be aware of common food triggers: Routine exercise Stay adequately hydrated (aim for 64 oz water  daily) Keep headache diary Maintain proper stress management Maintain proper sleep hygiene Do not skip meals Consider supplements:  magnesium citrate 400mg  daily, riboflavin 400mg  daily, coenzyme Q10 100mg  three times daily.

## 2023-10-27 ENCOUNTER — Encounter: Payer: Self-pay | Admitting: Neurology

## 2023-11-03 ENCOUNTER — Ambulatory Visit: Admitting: Professional Counselor

## 2023-11-04 ENCOUNTER — Encounter: Payer: Self-pay | Admitting: Psychiatry

## 2023-11-04 ENCOUNTER — Ambulatory Visit (INDEPENDENT_AMBULATORY_CARE_PROVIDER_SITE_OTHER): Admitting: Psychiatry

## 2023-11-04 VITALS — BP 124/82 | HR 76 | Temp 97.8°F | Ht 73.0 in | Wt 229.6 lb

## 2023-11-04 DIAGNOSIS — G4700 Insomnia, unspecified: Secondary | ICD-10-CM | POA: Diagnosis not present

## 2023-11-04 DIAGNOSIS — F902 Attention-deficit hyperactivity disorder, combined type: Secondary | ICD-10-CM

## 2023-11-04 DIAGNOSIS — F3341 Major depressive disorder, recurrent, in partial remission: Secondary | ICD-10-CM | POA: Diagnosis not present

## 2023-11-04 DIAGNOSIS — Z79899 Other long term (current) drug therapy: Secondary | ICD-10-CM

## 2023-11-04 DIAGNOSIS — Z634 Disappearance and death of family member: Secondary | ICD-10-CM

## 2023-11-04 DIAGNOSIS — F411 Generalized anxiety disorder: Secondary | ICD-10-CM

## 2023-11-04 MED ORDER — AMPHETAMINE-DEXTROAMPHETAMINE 10 MG PO TABS: 10.0000 mg | ORAL_TABLET | Freq: Every day | ORAL | 0 refills | Status: DC

## 2023-11-04 MED ORDER — ZOLPIDEM TARTRATE 5 MG PO TABS: 2.5000 mg | ORAL_TABLET | Freq: Every evening | ORAL | 0 refills | Status: AC | PRN

## 2023-11-04 MED ORDER — AMPHETAMINE-DEXTROAMPHET ER 30 MG PO CP24: 30.0000 mg | ORAL_CAPSULE | ORAL | 0 refills | Status: DC

## 2023-11-04 NOTE — Progress Notes (Signed)
 BH MD OP Progress Note  11/04/2023 1:36 PM Caitlyn Harris  MRN:  [id number]  Chief Complaint:  Chief Complaint  Patient presents with   Follow-up   Anxiety   Depression   Medication Refill   Discussed the use of AI scribe software for clinical note transcription with the patient, who gave verbal consent to proceed.  History of Present Illness Caitlyn Harris is a 43 year old Caucasian female, married, employed, lives in [place], has a history of ADD, GAD, ADHD, IBS, migraine headaches, hysterectomy was evaluated in office today for a follow-up appointment.  She describes ongoing grief as the one-year anniversary of her mother's death approaches, noting increased emotional distress with the upcoming date. 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 Feelings of sadness and tearfulness arise, especially when reminded of her mother, and she finds these changes and losses challenging. She states that her lack of interest and motivation relates primarily to grief rather than depression, and she denies hopelessness. She continues to see her therapist, Ms.Elizabeth, but missed a recent appointment due to a headache and falling asleep after work.  She reports increased sleep, including daytime napping, which she relates to headaches, fatigue, and sometimes her son's request for her to be upstairs. She notes sleeping more than usual at night and during the day. Night sweats disrupt her sleep and have persisted for at least the past 1.5 years. She reports that a prior sleep study found that she wakes up frequently, is a light sleeper, and does not stay in REM sleep for long. She uses Ambien  as needed, typically taking half a pill once or twice a week to assist with sleep.  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 Daily headaches and occasional migraines require her to rest. For migraine management, she uses Ajovy  and sumatriptan  as needed and notes that she no longer takes Ubrelvy . Ongoing night sweats and difficulty with temperature regulation at night persist, even with multiple fans and cooling bedding. She recently started a low-dose birth control pill for hormonal management but has not yet completed a full cycle and has not noticed improvement in her night sweats.  She takes sertraline  75 mg daily for mood, which she feels is effective, and Adderall XR 30 mg in the morning with an additional 10 mg after lunch for attention and focus. She recently ran out of Adderall but has since resumed it. She describes her appetite as decreased, stating that she does not crave food but continues to eat regularly. She notes some improvement in her ability to complete household tasks, such as putting away laundry, and has created a dedicated space for herself to relax.  She denies use of drugs and alcohol. She reports rare alcohol use, stating that if she has alcohol, it is infrequent, such as a daiquiri approximately every 6 months.  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 She lives with her husband and children. She regularly spends time with her children and nephew. She enjoys running a summer camp and spending time with kids, including those with different disabilities. She reports making progress on organizing her home space, such as clearing a chair for personal use.  She denies any suicidality, homicidality or perceptual disturbances.  Visit Diagnosis:    ICD-10-CM   1. Recurrent major depressive disorder, in partial remission (HCC)  F33.41 zolpidem  (AMBIEN ) 5 MG tablet    2. GAD (generalized anxiety disorder)  F41.1     3. Attention deficit hyperactivity disorder  (ADHD), combined type  F90.2 amphetamine -dextroamphetamine  (ADDERALL XR) 30 MG 24 hr capsule    amphetamine -dextroamphetamine  (ADDERALL) 10 MG tablet    4. Insomnia, unspecified type  G47.00     5. Bereavement  Z63.4     6. High risk medication use  Z79.899 Urine drugs of abuse scrn w alc, routine (Ref Lab)      Past Psychiatric History: I have reviewed past psychiatric history from progress note on 11/15/2021.  Past trials of medications like Celexa , Ambien , ADHD medications-Vyvanse  and several others.  Patient had ADHD testing-Dr. Frederic Fire dated 01/29/2022-meets criteria for ADHD combined type.  Completed MH IOP-November 2023.  Past Medical History:  Past Medical History:  Diagnosis Date   Anxiety    Depression    Endometriosis    GERD (gastroesophageal reflux disease)    OCC   History of PCOS    Hyperlipidemia    Migraine    Seizures (HCC)    FEBRILE AS A BABY    Past Surgical History:  Procedure Laterality Date   ABDOMINAL HYSTERECTOMY     CERVICAL BIOPSY  W/ LOOP ELECTRODE EXCISION     COLONOSCOPY WITH PROPOFOL  N/A 01/16/2022   Procedure: COLONOSCOPY WITH PROPOFOL ;  Surgeon: Therisa Bi, MD;  Location: [facility] ENDOSCOPY;  Service: Gastroenterology;  Laterality: N/A;   ECTOPIC PREGNANCY SURGERY     endometirosis     ESOPHAGOGASTRODUODENOSCOPY (EGD) WITH PROPOFOL  N/A 01/16/2022   Procedure: ESOPHAGOGASTRODUODENOSCOPY (EGD) WITH PROPOFOL ;  Surgeon: Therisa Bi, MD;  Location[facility] ENDOSCOPY;  Service: Gastroenterology;  Laterality: N/A;   HAND SURGERY     THUMB SURGERY   LAPAROSCOPIC OVARIAN CYSTECTOMY Left 03/02/2018   Procedure: LAPAROSCOPIC RIGHT OVARIAN CYSTECTOMY;  Surgeon: Arloa Lamar SQUIBB, MD;  Location: [facility] ORS;  Service: Gynecology;  Laterality: Left;   LAPAROSCOPIC OVARIAN CYSTECTOMY Left 08/30/2019   Procedure: LAPAROSCOPIC OVARIAN CYSTECTOMY;  Surgeon: Arloa Lamar SQUIBB, MD;  Location: [facility] ORS;  Service: Gynecology;  Laterality: Left;   LEEP     LEEP      PERINEOPLASTY N/A 03/02/2018   Procedure: PERINEORRHAPHY;  Surgeon: Arloa Lamar SQUIBB, MD;  Location: [facility] ORS;  Service: Gynecology;  Laterality: N/A;   RECTOCELE REPAIR N/A 03/02/2018   Procedure: POSTERIOR REPAIR (RECTOCELE);  Surgeon: Arloa Lamar SQUIBB, MD;  Location: [facility] ORS;  Service: Gynecology;  Laterality: N/A;   VENTRAL HERNIA REPAIR N/A 03/19/2017   Procedure: HERNIA REPAIR VENTRAL ADULT;  Surgeon: Wonda Charlie BRAVO, MD;  Location: [facility] ORS;  Service: General;  Laterality: N/A;    Family Psychiatric History: I have reviewed family psychiatric history from progress note on 11/15/2021.  Family History:  Family History  Problem Relation Age of Onset   Hypertension Mother    Diabetes Mother    CVA Mother    Chronic Renal Failure Mother    Renal cancer Father    Hypertension Father    Post-traumatic stress disorder Brother    Drug abuse Maternal Uncle    Alcohol abuse Maternal Uncle    Colon cancer Paternal Aunt    Bipolar disorder Half-Sister    Bipolar disorder Niece    Suicidality Other     Social History: I have reviewed social history from progress note on 11/15/2021. Social History   Socioeconomic History  Marital status: Married    Spouse name: Not on file   Number of children: 2   Years of education: Not on file   Highest education level: Master's degree (e.g., MA, MS, MEng, MEd, MSW, MBA)  Occupational History   Not on file  Tobacco Use   Smoking status: Former    Current packs/day: 0.00    Types: Cigarettes    Start date: 03/12/2002    Quit date: 03/12/2009    Years since quitting: 14.6   Smokeless tobacco: Never   Tobacco comments:    1 PACK EVERY 2 WEEKS  Vaping Use   Vaping status: Never Used  Substance and Sexual Activity   Alcohol use: Not Currently    Comment: RARE   Drug use: No   Sexual activity: Yes  Other Topics Concern   Not on file  Social History Narrative   Right handed   Caffeine prn   Two story home   Lives with husband and two  sons and mom   Currently Occcupational therapist   Social Drivers of Health   Financial Resource Strain: Low Risk  (06/16/2023)   Overall Financial Resource Strain (CARDIA)    Difficulty of Paying Living Expenses: Not hard at all  Food Insecurity: No Food Insecurity (06/16/2023)   Hunger Vital Sign    Worried About Running Out of Food in the Last Year: Never true    Ran Out of Food in the Last Year: Never true  Transportation Needs: No Transportation Needs (06/16/2023)   PRAPARE - Administrator, Civil Service (Medical): No    Lack of Transportation (Non-Medical): No  Physical Activity: Inactive (06/16/2023)   Exercise Vital Sign    Days of Exercise per Week: 0 days    Minutes of Exercise per Session: 0 min  Stress: Stress Concern Present (06/16/2023)   [employer] of Occupational Health - Occupational Stress Questionnaire    Feeling of Stress : To some extent  Social Connections: Moderately Isolated (06/16/2023)   Social Connection and Isolation Panel    Frequency of Communication with Friends and Family: Three times a week    Frequency of Social Gatherings with Friends and Family: Never    Attends Religious Services: Never    Database administrator or Organizations: No    Attends Banker Meetings: Never    Marital Status: Married    Allergies:  Allergies  Allergen Reactions   Tape     Low sensitivity to tape, ok with Tegaderm and paper tape     Metabolic Disorder Labs: Lab Results  Component Value Date   HGBA1C 5.2 03/10/2023   Lab Results  Component Value Date   PROLACTIN 13.3 12/21/2017   Lab Results  Component Value Date   CHOL 188 05/12/2023   TRIG (H) 05/12/2023    405.0 Triglyceride is over 400; calculations on Lipids are invalid.   HDL 35.40 (L) 05/12/2023   CHOLHDL 5 05/12/2023   VLDL 81.0 (H) 05/12/2023   LDLCALC 75 06/07/2018   Lab Results  Component Value Date   TSH 1.52 03/10/2023   TSH 1.57 06/26/2021     Therapeutic Level Labs: No results found for: LITHIUM No results found for: VALPROATE No results found for: CBMZ  Current Medications: Current Outpatient Medications  Medication Sig Dispense Refill   acetaminophen  (TYLENOL ) 500 MG tablet Take 1,000 mg by mouth every 6 (six) hours as needed for moderate pain or headache.     cholecalciferol (VITAMIN D3)  25 MCG (1000 UNIT) tablet Take 5,000 Units by mouth daily.     doxycycline (VIBRAMYCIN) 100 MG capsule Take 100 mg by mouth 2 (two) times daily.     Fremanezumab -vfrm (AJOVY ) 225 MG/1.5ML SOAJ Inject 225 mg into the skin every 28 (twenty-eight) days. 1.68 mL 11   hydrOXYzine  (VISTARIL ) 25 MG capsule Take 1-2 capsules (25-50 mg total) by mouth at bedtime as needed. For sleep and anxiety 180 capsule 0   ibuprofen  (ADVIL ) 400 MG tablet Take 400 mg by mouth every 6 (six) hours as needed.     Norethindrone -Ethinyl Estradiol-Fe Biphas (LO LOESTRIN FE) 1 MG-10 MCG / 10 MCG tablet Take 1 tablet by mouth daily. 84 tablet 3   omeprazole  (PRILOSEC) 20 MG capsule TAKE 1 CAPSULE (20 MG TOTAL) BY MOUTH 2 (TWO) TIMES DAILY BEFORE A MEAL. 180 capsule 3   ondansetron  (ZOFRAN -ODT) 8 MG disintegrating tablet Take 1 tablet (8 mg total) by mouth every 8 (eight) hours as needed for nausea or vomiting. 20 tablet 0   sertraline  (ZOLOFT ) 50 MG tablet TAKE 1.5 TABLETS (75 MG TOTAL) BY MOUTH DAILY AFTER SUPPER. 135 tablet 0   SUMAtriptan  (IMITREX ) 100 MG tablet TAKE 1 TABLET BY MOUTH AT EARLIEST ONSET OF MIGRAINE. MAY REPEAT IN 2 HOURS. MAX 1 TAB/24 HOURS 10 tablet 5   tiZANidine  (ZANAFLEX ) 4 MG tablet Take 1 tablet (4 mg total) by mouth every 6 (six) hours as needed for muscle spasms. 30 tablet 5   topiramate  (TOPAMAX ) 100 MG tablet TAKE 1 TABLET BY MOUTH TWICE A DAY 180 tablet 1   amphetamine -dextroamphetamine  (ADDERALL XR) 30 MG 24 hr capsule Take 1 capsule (30 mg total) by mouth every morning. 30 capsule 0   amphetamine -dextroamphetamine  (ADDERALL) 10 MG  tablet Take 1 tablet (10 mg total) by mouth daily after lunch. 30 tablet 0   zolpidem  (AMBIEN ) 5 MG tablet Take 0.5-1 tablets (2.5-5 mg total) by mouth at bedtime as needed for sleep. 30 tablet 0   No current facility-administered medications for this visit.     Musculoskeletal: Strength & Muscle Tone: within normal limits Gait & Station: normal Patient leans: N/A  Psychiatric Specialty Exam: Review of Systems  Psychiatric/Behavioral:  Positive for sleep disturbance. The patient is nervous/anxious.        Grief    Blood pressure 124/82, pulse 76, temperature 97.8 F (36.6 C), temperature source Temporal, height 6' 1 (1.854 m), weight 229 lb 9.6 oz (104.1 kg), SpO2 96%.Body mass index is 30.29 kg/m.  General Appearance: Casual  Eye Contact:  Fair  Speech:  Clear and Coherent  Volume:  Normal  Mood:  Anxious, grief  Affect:  Congruent  Thought Process:  Goal Directed and Descriptions of Associations: Intact  Orientation:  Full (Time, Place, and Person)  Thought Content: Logical   Suicidal Thoughts:  No  Homicidal Thoughts:  No  Memory:  Immediate;   Fair Recent;   Fair Remote;   Fair  Judgement:  Fair  Insight:  Fair  Psychomotor Activity:  Normal  Concentration:  Concentration: Fair and Attention Span: Fair  Recall:  [employer] of Knowledge: Fair  Language: Fair  Akathisia:  No  Handed:  Right  AIMS (if indicated): not done  Assets:  Communication Skills Desire for Improvement Housing Social Support Transportation  ADL's:  Intact  Cognition: WNL  Sleep:  improving   Screenings: GAD-7    Flowsheet Row Office Visit from 08/19/2023 in [facility] [place] [facility] from 06/16/2023  in [facility] Office Visit from 05/06/2023 in [facility] Office Visit from 03/11/2023 in [facility] Office Visit from 12/25/2022 in [facility]  Total GAD-7 Score 9 8 7 8 13    PHQ2-9    Flowsheet Row Office Visit from 08/19/2023 in [facility] [place] [facility]or from 06/16/2023 in [facility] Office Visit from 05/06/2023 in [facility] Office Visit from 03/11/2023 in [facility] Office Visit from 03/10/2023 in [facility] [place]  PHQ-2 Total Score 4 6 4 3 4   PHQ-9 Total Score 13 23 15 18 18    Flowsheet Row ED from 08/13/2023 in [facility] Video Visit from 08/07/2023 in [facility] Video Visit from 06/29/2023 in [facility]  C-SSRS RISK CATEGORY No Risk No Risk No Risk     Assessment and Plan: Caitlyn Harris is a 43 year old Caucasian female, currently employed, has a history of depression, grief, ADHD, insomnia was evaluated in office today.  Discussed assessment and plan as noted below.  ADHD-improving Currently with good response to the Adderall and is more compliant with the medication. Continue Adderall XR 30 mg in the morning Continue Adderall 10 mg during the day as needed. Reviewed [place] PMP AWARxE  Depression in partial remission Bereavement-improving. Patient with recent worsening of grief since.  Anniversary of her mother's death coming up in a week from now.  Encouraged to continue psychotherapy sessions.  Not interested in medication changes today. Continue sertraline  75 mg daily Continue hydroxyzine  25 to 50 mg at bedtime as needed   Insomnia-improving Sleep problems complicated by night sweats, headaches.  Does have Ambien  available however does not take it every night.  Recently started on hormonal replacement for night sweats by OB/GYN. Continue Ambien  2.5-5 mg at bedtime as  needed Previously completed sleep study and reports she did not have sleep apnea Encouraged to work on sleep hygiene.  Follow-up Follow-up in clinic in 3 months or sooner if needed.    Collaboration of Care: Collaboration of Care: {BH OP Collaboration of Care:[id number]}  Patient/Guardian was advised Release of Information must be obtained prior to any record release in order to collaborate their care with an outside provider. Patient/Guardian was advised if they have not already done so to contact the registration department to sign all necessary forms in order for us  to release information regarding their care.   Consent: Patient/Guardian gives verbal consent for treatment and assignment of benefits for services provided during this visit. Patient/Guardian expressed understanding and agreed to proceed.    Harue Pribble, MD 11/04/2023, 1:36 PM

## 2023-11-07 ENCOUNTER — Other Ambulatory Visit: Payer: Self-pay | Admitting: Neurology

## 2023-11-16 ENCOUNTER — Ambulatory Visit: Admitting: Professional Counselor

## 2023-11-16 DIAGNOSIS — F902 Attention-deficit hyperactivity disorder, combined type: Secondary | ICD-10-CM | POA: Diagnosis not present

## 2023-11-16 DIAGNOSIS — F3341 Major depressive disorder, recurrent, in partial remission: Secondary | ICD-10-CM | POA: Diagnosis not present

## 2023-11-16 DIAGNOSIS — F411 Generalized anxiety disorder: Secondary | ICD-10-CM | POA: Diagnosis not present

## 2023-11-16 DIAGNOSIS — Z634 Disappearance and death of family member: Secondary | ICD-10-CM

## 2023-11-16 NOTE — Progress Notes (Signed)
 THERAPIST PROGRESS NOTE  Virtual Visit via Video Note  I connected with Caitlyn Harris on 11/16/23 at  9:00 AM EDT by a video enabled telemedicine application and verified that I am speaking with the correct person using two identifiers.  Location: Patient: Home Provider: Remote office   I discussed the limitations of evaluation and management by telemedicine and the availability of in person appointments. The patient expressed understanding and agreed to proceed.   I discussed the assessment and treatment plan with the patient. The patient was provided an opportunity to ask questions and all were answered. The patient agreed with the plan and demonstrated an understanding of the instructions.   The patient was advised to call back or seek an in-person evaluation if the symptoms worsen or if the condition fails to improve as anticipated.  I provided 49 minutes of non-face-to-face time during this encounter. Almarie JONETTA Ligas, [facility]  Session Time: 9:05 AM - 9:54 AM  Participation Level: Active  Behavioral Response: Casual, Alert, Euthymic  Type of Therapy: Individual Therapy  Treatment Goals addressed: Active OP Depression  LTG: I feel like I'm struggling with my day-to-day tasks, like the laundry, the vacuuming. Those things pile up and they just become overwhelming. My husband can't stand that stuff. I want to be more present. It's like I'm putting on for everyone else.                Start:  09/23/23    Expected End:  09/21/24      STG: To improve productivity AEB creating and following a daily routine and using coping mechanisms to stay on track over the next 12 weeks.    STG: To improve boundary setting AEB identifying limitations and engaging in self-care weekly over the next 12 weeks.    STG: To improve mindset AEB identifying and restructuring maladaptive patterns of thinking and practicing daily affirmations over the next 12 weeks.    ProgressTowards Goals:  Progressing  Interventions: Motivational Interviewing and Supportive  Summary: Caitlyn Harris is a 43 y.o. female who presents with a history of anxiety, depression, bereavement, and ADHD. She appeared somber but oriented x5. She stated the anniversary of her mother's death was this past weekend. She noted it wasn't as bad as she thought it was going to be. She found ways to remember her mother throughout the week prior. Caitlyn Harris discussed work and her struggle with some of the changes. She explored potential future plans for her career.   Therapist Response: Conducted session with Caitlyn Harris. Began session with check-in/update since previous session. Utilized empathetic and reflective listening. Used open-ended questions to facilitate discussion and summarized Caitlyn Harris's thoughts/feelings. Explored Caitlyn Harris's struggle with change/transition and future career plans. Scheduled additional appointment and concluded session.   Suicidal/Homicidal: No  Plan: Return again in 4 weeks.  Diagnosis: Recurrent major depressive disorder, in partial remission (HCC)  GAD (generalized anxiety disorder)  Attention deficit hyperactivity disorder (ADHD), combined type  Bereavement  Collaboration of Care: Medication Management AEB chart review  Patient/Guardian was advised Release of Information must be obtained prior to any record release in order to collaborate their care with an outside provider. Patient/Guardian was advised if they have not already done so to contact the registration department to sign all necessary forms in order for us  to release information regarding their care.   Consent: Patient/Guardian gives verbal consent for treatment and assignment of benefits for services provided during this visit. Patient/Guardian expressed understanding and agreed to proceed.  Almarie JONETTA Ligas, [facility] 11/16/2023

## 2023-12-11 ENCOUNTER — Ambulatory Visit: Payer: Self-pay | Admitting: Family Medicine

## 2023-12-11 NOTE — Telephone Encounter (Signed)
 Agree with UC disposition for severe neck pain  Will watch for correspondence

## 2023-12-11 NOTE — Telephone Encounter (Signed)
 FYI Only or Action Required?: FYI only for provider.  Patient was last seen in primary care on 08/19/2023 by Caitlyn Laine LABOR, MD.  Called Nurse Triage reporting Neck Pain.  Symptoms began a week ago.  Interventions attempted: Other: Massage, tried a different pillow, ice pack.  Symptoms are: gradually worsening.  Triage Disposition: See HCP Within 4 Hours (Or PCP Triage)  Patient/caregiver understands and will follow disposition?: Yes            Copied from CRM [id number]. Topic: Clinical - Red Word Triage >> Dec 11, 2023  3:11 PM [name] wrote: [facility] that prompted transfer to Nurse Triage: Patient calls in states been having real bad neck and shoulder pain, also been having trouble sleeping    ----------------------------------------------------------------------- From previous Reason for Contact - Scheduling: Patient/patient representative is calling to schedule an appointment. Refer to attachments for appointment information. Reason for Disposition  [1] SEVERE neck pain (e.g., excruciating, unable to do any normal activities) AND [2] not improved after 2 hours of pain medicine  Answer Assessment - Initial Assessment Questions This RN recommends pt goes to urgent care as no appointment availability in office. Pt agreeable. This RN educated pt on new-worsening symptom and when to call back/seek emergent care. Pt verbalized understanding and agrees to plan.    ONSET: When did the pain begin?      One week ago, worsening  LOCATION: Where does it hurt?      Base of neck, across shoulder blades  SEVERITY: How bad is the pain?  (Scale 0-10; or none or slight stiffness, mild, moderate, severe)     Achy, arms feel heavy; when tilting head to look up it worsens down spine; when turning head pt sometimes feels dizzy; 8-9/10 pain constant  CAUSE: What do you think is causing the neck pain?     Not sure if its stenosis or muscular per pt; pt states she has bad  posture  OTHER SYMPTOMS: Do you have any other symptoms? (e.g., headache, fever, chest pain, difficulty breathing, neck swelling)     Difficulty sleeping, headache, nauseous  Protocols used: Neck Pain or Stiffness-A-AH

## 2023-12-17 ENCOUNTER — Ambulatory Visit: Admitting: Professional Counselor

## 2024-01-20 ENCOUNTER — Other Ambulatory Visit: Payer: Self-pay | Admitting: Psychiatry

## 2024-01-20 DIAGNOSIS — F411 Generalized anxiety disorder: Secondary | ICD-10-CM

## 2024-02-01 ENCOUNTER — Encounter: Payer: Self-pay | Admitting: Psychiatry

## 2024-02-01 ENCOUNTER — Telehealth (INDEPENDENT_AMBULATORY_CARE_PROVIDER_SITE_OTHER): Admitting: Psychiatry

## 2024-02-01 DIAGNOSIS — F411 Generalized anxiety disorder: Secondary | ICD-10-CM

## 2024-02-01 DIAGNOSIS — Z79899 Other long term (current) drug therapy: Secondary | ICD-10-CM

## 2024-02-01 DIAGNOSIS — G4700 Insomnia, unspecified: Secondary | ICD-10-CM | POA: Diagnosis not present

## 2024-02-01 DIAGNOSIS — F3341 Major depressive disorder, recurrent, in partial remission: Secondary | ICD-10-CM

## 2024-02-01 DIAGNOSIS — F902 Attention-deficit hyperactivity disorder, combined type: Secondary | ICD-10-CM

## 2024-02-01 DIAGNOSIS — Z634 Disappearance and death of family member: Secondary | ICD-10-CM

## 2024-02-01 MED ORDER — AMITRIPTYLINE HCL 10 MG PO TABS: 10.0000 mg | ORAL_TABLET | Freq: Every day | ORAL | 0 refills | Status: DC

## 2024-02-01 MED ORDER — SERTRALINE HCL 50 MG PO TABS: 25.0000 mg | ORAL_TABLET | Freq: Every day | ORAL | Status: DC

## 2024-02-01 MED ORDER — AMPHETAMINE-DEXTROAMPHET ER 30 MG PO CP24: 30.0000 mg | ORAL_CAPSULE | ORAL | 0 refills | Status: DC

## 2024-02-01 NOTE — Progress Notes (Signed)
 Virtual Visit via Video Note  I connected with Caitlyn Harris on 02/01/24 at  1:30 PM EDT by a video enabled telemedicine application and verified that I am speaking with the correct person using two identifiers.  Location Provider Location : [facility] Patient Location : Work  Participants: Patient , Provider   I discussed the limitations of evaluation and management by telemedicine and the availability of in person appointments. The patient expressed understanding and agreed to proceed.   I discussed the assessment and treatment plan with the patient. The patient was provided an opportunity to ask questions and all were answered. The patient agreed with the plan and demonstrated an understanding of the instructions.   The patient was advised to call back or seek an in-person evaluation if the symptoms worsen or if the condition fails to improve as anticipated.   BH MD OP Progress Note  02/01/2024 1:57 PM Caitlyn Harris  MRN:  [id number]  Chief Complaint:  Chief Complaint  Patient presents with   Follow-up   Anxiety   Depression   Medication Refill   Discussed the use of AI scribe software for clinical note transcription with the patient, who gave verbal consent to proceed.  History of Present Illness Caitlyn Harris is a 43 year old Caucasian female, married, employed, lives in [place], has a history of ADD, GAD, ADHD, IBS, migraine headaches, hysterectomy was evaluated by telemedicine today.  She shares that her anxiety remains generally manageable, though occasional worries arise, particularly regarding living long enough to see potential grandchildren. She connects these concerns to her parents having died in their 53. Overall, she feels her current medications for anxiety and depression are effective. Her regimen includes sertraline  75 mg for mood and anxiety. She also takes Adderall 30 mg, but notes that she ran out and forgot to message her provider for a  refill. On days when she experiences a migraine or wakes up with one, she chooses not to take Adderall, as she feels it can trigger or worsen her headaches. For sleep, she uses Ambien  as needed, usually a half tablet, and only on some days when necessary.  She continues to struggle with tension headaches and migraines, with particular difficulty over the past 2 weeks, though she notes improvement in the last few days. She sometimes experiences headaches with neck and shoulder pain, especially around her left scapula, which can become severe enough to limit her ability to turn her neck and impact her functioning at work. To manage pain, she uses various coping strategies, including heat and ice packs, a heating pad at work, massage from her husband, kinesio tape, and a TENS unit. She also experiments with different pillows to help alleviate symptoms. For headache management, she currently takes topiramate  and Ajovy . On days when migraines are severe, she sometimes leaves work early or calls out sick, and the day after a severe migraine, she feels unwell, describing this as a hangover.  She denies any suicidality, homicidality or perceptual disturbances.  She is agreeable to trial of amitriptyline or Elavil and tapering off of sertraline  due to chronic migraine headaches and the potential for SSRIs to worsen her headaches.   Visit Diagnosis:    ICD-10-CM   1. Recurrent major depressive disorder, in partial remission  F33.41 amitriptyline (ELAVIL) 10 MG tablet    2. GAD (generalized anxiety disorder)  F41.1 sertraline  (ZOLOFT ) 50 MG tablet    amitriptyline (ELAVIL) 10 MG tablet    3. Attention deficit hyperactivity disorder (ADHD), combined type  F90.2 amphetamine -dextroamphetamine  (ADDERALL XR) 30 MG 24 hr capsule    4. Insomnia, unspecified type  G47.00     5. Bereavement  Z63.4       Past Psychiatric History: I have reviewed past psychiatric history from progress note on 11/15/2021.  Past  trials of medications like Celexa , Ambien , ADHD medications-Vyvanse  and several others.  Patient had ADHD testing-Dr. Frederic Fire dated 01/29/2022-meets criteria for ADHD combined type.  Completed [facility] IOP-November 2023.  Past Medical History:  Past Medical History:  Diagnosis Date   Anxiety    Depression    Endometriosis    GERD (gastroesophageal reflux disease)    OCC   History of PCOS    Hyperlipidemia    Migraine    Seizures (HCC)    FEBRILE AS A BABY    Past Surgical History:  Procedure Laterality Date   ABDOMINAL HYSTERECTOMY     CERVICAL BIOPSY  W/ LOOP ELECTRODE EXCISION     COLONOSCOPY WITH PROPOFOL  N/A 01/16/2022   Procedure: COLONOSCOPY WITH PROPOFOL ;  Surgeon: Therisa Bi, MD;  Location: [facility] ENDOSCOPY;  Service: Gastroenterology;  Laterality: N/A;   ECTOPIC PREGNANCY SURGERY     endometirosis     ESOPHAGOGASTRODUODENOSCOPY (EGD) WITH PROPOFOL  N/A 01/16/2022   Procedure: ESOPHAGOGASTRODUODENOSCOPY (EGD) WITH PROPOFOL ;  Surgeon: Therisa Bi, MD;  Location: [facility] ENDOSCOPY;  Service: Gastroenterology;  Laterality: N/A;   HAND SURGERY     THUMB SURGERY   LAPAROSCOPIC OVARIAN CYSTECTOMY Left 03/02/2018   Procedure: LAPAROSCOPIC RIGHT OVARIAN CYSTECTOMY;  Surgeon: Arloa Lamar SQUIBB, MD;  Location: [facility] ORS;  Service: Gynecology;  Laterality: Left;   LAPAROSCOPIC OVARIAN CYSTECTOMY Left 08/30/2019   Procedure: LAPAROSCOPIC OVARIAN CYSTECTOMY;  Surgeon: Arloa Lamar SQUIBB, MD;  Location: [facility] ORS;  Service: Gynecology;  Laterality: Left;   LEEP     LEEP     PERINEOPLASTY N/A 03/02/2018   Procedure: PERINEORRHAPHY;  Surgeon: Arloa Lamar SQUIBB, MD;  Location: [facility] ORS;  Service: Gynecology;  Laterality: N/A;   RECTOCELE REPAIR N/A 03/02/2018   Procedure: POSTERIOR REPAIR (RECTOCELE);  Surgeon: Arloa Lamar SQUIBB, MD;  Location: [facility] ORS;  Service: Gynecology;  Laterality: N/A;   VENTRAL HERNIA REPAIR N/A 03/19/2017   Procedure: HERNIA REPAIR VENTRAL ADULT;  Surgeon: Wonda Charlie BRAVO, MD;  Location: [facility] ORS;  Service: General;  Laterality: N/A;    Family Psychiatric History: I have reviewed family psychiatric history from progress note on 11/15/2021.  Family History:  Family History  Problem Relation Age of Onset   Hypertension Mother    Diabetes Mother    CVA Mother    Chronic Renal Failure Mother    Renal cancer Father    Hypertension Father    Post-traumatic stress disorder Brother    Drug abuse Maternal Uncle    Alcohol abuse Maternal Uncle    Colon cancer Paternal Aunt    Bipolar disorder Half-Sister    Bipolar disorder Niece    Suicidality Other     Social History: I have reviewed social history from progress note on 11/15/2021. Social History   Socioeconomic History   Marital status: Married    Spouse name: Not on file   Number of children: 2   Years of education: Not on file   Highest education level: Master's degree (e.g., MA, MS, MEng, MEd, MSW, MBA)  Occupational History   Not on file  Tobacco Use   Smoking status: Former    Current packs/day: 0.00    Types: Cigarettes    Start date: 03/12/2002  Quit date: 03/12/2009    Years since quitting: 14.9   Smokeless tobacco: Never   Tobacco comments:    1 PACK EVERY 2 WEEKS  Vaping Use   Vaping status: Never Used  Substance and Sexual Activity   Alcohol use: Not Currently    Comment: RARE   Drug use: No   Sexual activity: Yes  Other Topics Concern   Not on file  Social History Narrative   Right handed   Caffeine prn   Two story home   Lives with husband and two sons and mom   Currently Occcupational therapist   Social Drivers of Health   Financial Resource Strain: Low Risk  (06/16/2023)   Overall Financial Resource Strain (CARDIA)    Difficulty of Paying Living Expenses: Not hard at all  Food Insecurity: No Food Insecurity (06/16/2023)   Hunger Vital Sign    Worried About Running Out of Food in the Last Year: Never true    Ran Out of Food in the Last Year: Never true   Transportation Needs: No Transportation Needs (06/16/2023)   PRAPARE - Administrator, Civil Service (Medical): No    Lack of Transportation (Non-Medical): No  Physical Activity: Inactive (06/16/2023)   Exercise Vital Sign    Days of Exercise per Week: 0 days    Minutes of Exercise per Session: 0 min  Stress: Stress Concern Present (06/16/2023)   [employer] of Occupational Health - Occupational Stress Questionnaire    Feeling of Stress : To some extent  Social Connections: Moderately Isolated (06/16/2023)   Social Connection and Isolation Panel    Frequency of Communication with Friends and Family: Three times a week    Frequency of Social Gatherings with Friends and Family: Never    Attends Religious Services: Never    Database Administrator or Organizations: No    Attends Banker Meetings: Never    Marital Status: Married    Allergies:  Allergies  Allergen Reactions   Tape     Low sensitivity to tape, ok with Tegaderm and paper tape     Metabolic Disorder Labs: Lab Results  Component Value Date   HGBA1C 5.2 03/10/2023   Lab Results  Component Value Date   PROLACTIN 13.3 12/21/2017   Lab Results  Component Value Date   CHOL 188 05/12/2023   TRIG (H) 05/12/2023    405.0 Triglyceride is over 400; calculations on Lipids are invalid.   HDL 35.40 (L) 05/12/2023   CHOLHDL 5 05/12/2023   VLDL 81.0 (H) 05/12/2023   LDLCALC 75 06/07/2018   Lab Results  Component Value Date   TSH 1.52 03/10/2023   TSH 1.57 06/26/2021    Therapeutic Level Labs: No results found for: LITHIUM No results found for: VALPROATE No results found for: CBMZ  Current Medications: Current Outpatient Medications  Medication Sig Dispense Refill   amitriptyline (ELAVIL) 10 MG tablet Take 1 tablet (10 mg total) by mouth at bedtime. 15 tablet 0   acetaminophen  (TYLENOL ) 500 MG tablet Take 1,000 mg by mouth every 6 (six) hours as needed for moderate pain or  headache.     amphetamine -dextroamphetamine  (ADDERALL XR) 30 MG 24 hr capsule Take 1 capsule (30 mg total) by mouth every morning. 30 capsule 0   amphetamine -dextroamphetamine  (ADDERALL) 10 MG tablet Take 1 tablet (10 mg total) by mouth daily after lunch. 30 tablet 0   cholecalciferol (VITAMIN D3) 25 MCG (1000 UNIT) tablet Take 5,000 Units by mouth daily.  doxycycline (VIBRAMYCIN) 100 MG capsule Take 100 mg by mouth 2 (two) times daily.     Fremanezumab -vfrm (AJOVY ) 225 MG/1.5ML SOAJ Inject 225 mg into the skin every 28 (twenty-eight) days. 1.68 mL 11   hydrOXYzine  (VISTARIL ) 25 MG capsule Take 1-2 capsules (25-50 mg total) by mouth at bedtime as needed. For sleep and anxiety 180 capsule 0   ibuprofen  (ADVIL ) 400 MG tablet Take 400 mg by mouth every 6 (six) hours as needed.     Norethindrone -Ethinyl Estradiol-Fe Biphas (LO LOESTRIN FE) 1 MG-10 MCG / 10 MCG tablet Take 1 tablet by mouth daily. 84 tablet 3   omeprazole  (PRILOSEC) 20 MG capsule TAKE 1 CAPSULE (20 MG TOTAL) BY MOUTH 2 (TWO) TIMES DAILY BEFORE A MEAL. 180 capsule 3   ondansetron  (ZOFRAN -ODT) 8 MG disintegrating tablet TAKE 1 TABLET BY MOUTH EVERY 8 HOURS AS NEEDED FOR NAUSEA OR VOMITING. 18 tablet 1   sertraline  (ZOLOFT ) 50 MG tablet Take 0.5-1 tablets (25-50 mg total) by mouth daily after supper. Take 1 tablet daily for 1 week and reduce to half tablet after that for a week and stop     SUMAtriptan  (IMITREX ) 100 MG tablet TAKE 1 TABLET BY MOUTH AT EARLIEST ONSET OF MIGRAINE. MAY REPEAT IN 2 HOURS. MAX 1 TAB/24 HOURS 10 tablet 5   tiZANidine  (ZANAFLEX ) 4 MG tablet Take 1 tablet (4 mg total) by mouth every 6 (six) hours as needed for muscle spasms. 30 tablet 5   topiramate  (TOPAMAX ) 100 MG tablet TAKE 1 TABLET BY MOUTH TWICE A DAY 180 tablet 1   zolpidem  (AMBIEN ) 5 MG tablet Take 0.5-1 tablets (2.5-5 mg total) by mouth at bedtime as needed for sleep. 30 tablet 0   No current facility-administered medications for this visit.      Musculoskeletal: Strength & Muscle Tone: UTA Gait & Station: Seated Patient leans: N/A  Psychiatric Specialty Exam: Review of Systems  Psychiatric/Behavioral:  The patient is nervous/anxious.     There were no vitals taken for this visit.There is no height or weight on file to calculate BMI.  General Appearance: Casual  Eye Contact:  Fair  Speech:  Clear and Coherent  Volume:  Normal  Mood:  Anxious  Affect:  Appropriate  Thought Process:  Goal Directed and Descriptions of Associations: Intact  Orientation:  Full (Time, Place, and Person)  Thought Content: Logical   Suicidal Thoughts:  No  Homicidal Thoughts:  No  Memory:  Immediate;   Fair Recent;   Fair Remote;   Fair  Judgement:  Fair  Insight:  Fair  Psychomotor Activity:  Normal  Concentration:  Concentration: Fair and Attention Span: Fair  Recall:  [employer] of Knowledge: Fair  Language: Fair  Akathisia:  No  Handed:  Right  AIMS (if indicated): not done  Assets:  Communication Skills Desire for Improvement Housing Social Support Transportation  ADL's:  Intact  Cognition: WNL  Sleep:  improving   Screenings: GAD-7    Loss Adjuster, Chartered Office Visit from 08/19/2023 in [facility] [place] Counselor from 06/16/2023 in [facility] Office Visit from 05/06/2023 in [facility] Office Visit from 03/11/2023 in [facility] Office Visit from 12/25/2022 in [facility]  Total GAD-7 Score 9 8 7 8 13    PHQ2-9    Flowsheet Row Office Visit from 08/19/2023 in [facility] [place] Counselor from 06/16/2023 in  [facility] Office Visit from 05/06/2023 in [facility] Office Visit from 03/11/2023 in [facility] Office Visit from 03/10/2023 in [facility] [place]  PHQ-2 Total Score 4 6 4 3 4   PHQ-9 Total Score 13 23 15 18 18    Flowsheet Row Office Visit from 11/04/2023 in [facility] ED from 08/13/2023 in [facility] at [facility] Video Visit from 08/07/2023 in [facility]  C-SSRS RISK CATEGORY No Risk No Risk No Risk     Assessment and Plan: Caitlyn Harris is a 43 year old Caucasian female, currently employed, has a history of depression, grief, ADHD, insomnia was evaluated by telemedicine today.  Discussed assessment and plan as noted below.   1. Recurrent major depressive disorder, in partial remission Currently continues to have headaches which does have an effect on her mood although overall doing well.  Agreeable to trial of amitriptyline. Taper off sertraline , advised to take sertraline  50 mg for a week and then reduce to 25 mg after that and stop taking it in a week. Start amitriptyline 10 mg daily at bedtime when sertraline  is reduced to 25 mg daily Provided education about drug-drug interaction.   2. GAD (generalized anxiety disorder)-improving Does have situational anxiety mostly due to chronic headaches. Start amitriptyline as noted above.  3. Attention deficit hyperactivity disorder (ADHD), combined type-stable Currently takes Adderall although she ran out and never requested a refill.  Pending urine drug screen. Continue Adderall XR 30 mg daily Continue Adderall 10 mg daily in the afternoon as needed Reviewed [place]MP AWARxE Pending urine drug screen.  4. Insomnia, unspecified type-improving Does have chronic headache, pain which does complicate her sleep Will need sufficient pain management Start amitriptyline 10 mg at bedtime which helps with sleep as well Continue Ambien  as needed.  5. Bereavement-improving Reports coping with  grief better than before. Will reevaluate in future sessions.  Follow-up Follow-up in clinic in 1 week or sooner if needed.   Collaboration of Care: Collaboration of Care: Other previously referred to psychotherapist, pending  Patient/Guardian was advised Release of Information must be obtained prior to any record release in order to collaborate their care with an outside provider. Patient/Guardian was advised if they have not already done so to contact the registration department to sign all necessary forms in order for us  to release information regarding their care.   Consent: Patient/Guardian gives verbal consent for treatment and assignment of benefits for services provided during this visit. Patient/Guardian expressed understanding and agreed to proceed.  This note was generated in part or whole with voice recognition software. Voice recognition is usually quite accurate but there are transcription errors that can and very often do occur. I apologize for any typographical errors that were not detected and corrected.     Beza Steppe, MD 02/01/2024, 1:57 PM

## 2024-02-01 NOTE — Patient Instructions (Signed)
 Amitriptyline Tablets What is this medication? AMITRIPTYLINE (a mee TRIP ti leen) treats depression. It increases the amount of serotonin and norepinephrine in the brain, hormones that help regulate mood. It belongs to a group of medications called tricyclic antidepressants (TCAs). This medicine may be used for other purposes; ask your health care provider or pharmacist if you have questions. COMMON BRAND NAME(S): Elavil, Vanatrip What should I tell my care team before I take this medication? They need to know if you have any of these conditions: Asthma, trouble breathing Bipolar disorder or schizophrenia Difficulty passing urine, prostate trouble Frequently drink alcohol Glaucoma Heart disease or previous heart attack Liver disease Seizures Suicidal thoughts, plans, or attempt by you or a family member Thyroid disease An unusual or allergic reaction to amitriptyline, other medications, foods, dyes, or preservatives Pregnant or trying to get pregnant Breastfeeding How should I use this medication? Take this medication by mouth with a drink of water. Follow the directions on the prescription label. You can take the tablets with or without food. Take your medication at regular intervals. Do not take it more often than directed. Do not stop taking this medication suddenly except upon the advice of your care team. Stopping this medication too quickly may cause serious side effects or your condition may worsen. A special MedGuide will be given to you by the pharmacist with each prescription and refill. Be sure to read this information carefully each time. Talk to your care team regarding the use of this medication in children. Special care may be needed. Overdosage: If you think you have taken too much of this medicine contact a poison control center or emergency room at once. NOTE: This medicine is only for you. Do not share this medicine with others. What if I miss a dose? If you miss a dose,  take it as soon as you can. If it is almost time for your next dose, take only that dose. Do not take double or extra doses. What may interact with this medication? Do not take this medication with any of the following: Arsenic trioxide Certain medications used to regulate abnormal heartbeat or to treat other heart conditions Cisapride Droperidol Halofantrine Linezolid MAOIs like Carbex, Eldepryl, Marplan, Nardil, and Parnate Methylene blue Other medications for mental depression Phenothiazines like perphenazine, thioridazine and chlorpromazine Pimozide Probucol Procarbazine Sparfloxacin St. John's Wort This medication may also interact with the following: Atropine and related medications like hyoscyamine, scopolamine, tolterodine and others Barbiturate medications for inducing sleep or treating seizures, like phenobarbital Cimetidine Disulfiram Ethchlorvynol Thyroid hormones such as levothyroxine Ziprasidone This list may not describe all possible interactions. Give your health care provider a list of all the medicines, herbs, non-prescription drugs, or dietary supplements you use. Also tell them if you smoke, drink alcohol, or use illegal drugs. Some items may interact with your medicine. What should I watch for while using this medication? Visit your care team for regular checks on your progress. It may take several weeks to see the full effects of this medication, and it is important to continue your treatment as prescribed by your care team. Tell your care team if your symptoms do not get better or if they get worse. Patients and their families should watch out for new or worsening thoughts of suicide or depression. Also watch out for sudden changes in feelings such as feeling anxious, agitated, panicky, irritable, hostile, aggressive, impulsive, severely restless, overly excited and hyperactive, or not being able to sleep. If this happens, especially at the  beginning of treatment  or after a change in dose, call your care team. This medication may affect your coordination, reaction time, or judgment. Do not drive or operate machinery until you know how this medication affects you. Sit up or stand slowly to reduce the risk of dizzy or fainting spells. Drinking alcohol with this medication can increase the risk of these side effects. Do not treat yourself for coughs, colds, or allergies while you are taking this medication without asking your care team for advice. Some ingredients can increase possible side effects. Your mouth may get dry. Chewing sugarless gum or sucking hard candy and drinking plenty of water may help. Contact your care team if the problem does not go away or is severe. This medication may cause dry eyes and blurred vision. If you wear contact lenses, you may feel some discomfort. Lubricating eye drops may help. See your care team if the problem does not go away or is severe. This medication can cause constipation. If you do not have a bowel movement for 3 days, call your care team. This medication can make you more sensitive to the sun. Keep out of the sun. If you cannot avoid being in the sun, wear protective clothing and sunscreen. Do not use sun lamps, tanning beds, or tanning booths. What side effects may I notice from receiving this medication? Side effects that you should report to your care team as soon as possible: Allergic reactions--skin rash, itching, hives, swelling of the face, lips, tongue, or throat Heart rhythm changes-- fast or irregular heartbeat, dizziness, feeling faint or lightheaded, chest pain, trouble breathing Serotonin syndrome--irritability, confusion, fast or irregular heartbeat, muscle stiffness, twitching muscles, sweating, high fever, seizure, chills, vomiting, diarrhea Sudden eye pain or change in vision such as blurry vision, seeing halos around lights, vision loss Thoughts of suicide or self-harm, worsening mood, feelings of  depression Side effects that usually do not require medical attention (report to your care team if they continue or are bothersome): Change in appetite or weight Change in sex drive or performance Constipation Dizziness Drowsiness Dry mouth Tremors This list may not describe all possible side effects. Call your doctor for medical advice about side effects. You may report side effects to FDA at 1-800-FDA-1088. Where should I keep my medication? Keep out of the reach of children and pets. Store at room temperature between 20 and 25 degrees C (68 and 77 degrees F). Throw away any unused medication after the expiration date. NOTE: This sheet is a summary. It may not cover all possible information. If you have questions about this medicine, talk to your doctor, pharmacist, or health care provider.  2024 Elsevier/Gold Standard (2021-12-05 00:00:00)

## 2024-02-02 ENCOUNTER — Telehealth: Payer: Self-pay | Admitting: Psychiatry

## 2024-02-02 NOTE — Telephone Encounter (Signed)
 Urine drug screen is still pending.  Will have staff contact patient to advise.

## 2024-02-02 NOTE — Telephone Encounter (Signed)
 Called patient to inform of message from provider no answer left voicemail to return call to office

## 2024-02-09 ENCOUNTER — Other Ambulatory Visit: Payer: Self-pay | Admitting: Psychiatry

## 2024-02-09 DIAGNOSIS — F3341 Major depressive disorder, recurrent, in partial remission: Secondary | ICD-10-CM

## 2024-02-09 DIAGNOSIS — F411 Generalized anxiety disorder: Secondary | ICD-10-CM

## 2024-02-10 ENCOUNTER — Encounter: Payer: Self-pay | Admitting: Psychiatry

## 2024-02-10 ENCOUNTER — Telehealth: Admitting: Psychiatry

## 2024-02-10 DIAGNOSIS — F411 Generalized anxiety disorder: Secondary | ICD-10-CM | POA: Diagnosis not present

## 2024-02-10 DIAGNOSIS — G4701 Insomnia due to medical condition: Secondary | ICD-10-CM | POA: Diagnosis not present

## 2024-02-10 DIAGNOSIS — F902 Attention-deficit hyperactivity disorder, combined type: Secondary | ICD-10-CM

## 2024-02-10 DIAGNOSIS — F3341 Major depressive disorder, recurrent, in partial remission: Secondary | ICD-10-CM | POA: Diagnosis not present

## 2024-02-10 MED ORDER — AMPHETAMINE-DEXTROAMPHET ER 30 MG PO CP24: 30.0000 mg | ORAL_CAPSULE | ORAL | Status: AC

## 2024-02-10 MED ORDER — AMPHETAMINE-DEXTROAMPHETAMINE 10 MG PO TABS: 10.0000 mg | ORAL_TABLET | Freq: Every day | ORAL | Status: DC

## 2024-02-10 MED ORDER — AMITRIPTYLINE HCL 25 MG PO TABS: 25.0000 mg | ORAL_TABLET | Freq: Every day | ORAL | 1 refills | Status: DC

## 2024-02-10 NOTE — Progress Notes (Signed)
 Virtual Visit via Video Note  I connected with Caitlyn Harris on 02/10/24 at  1:30 PM EST by a video enabled telemedicine application and verified that I am speaking with the correct person using two identifiers.  Location Provider Location : [facility] Patient Location : Work  Participants: Patient , Provider   I discussed the limitations of evaluation and management by telemedicine and the availability of in person appointments. The patient expressed understanding and agreed to proceed.   I discussed the assessment and treatment plan with the patient. The patient was provided an opportunity to ask questions and all were answered. The patient agreed with the plan and demonstrated an understanding of the instructions.   The patient was advised to call back or seek an in-person evaluation if the symptoms worsen or if the condition fails to improve as anticipated.   BH MD OP Progress Note  02/10/2024 1:59 PM LAQUIDA COTRELL  MRN:  [id number]  Chief Complaint:  Chief Complaint  Patient presents with   Follow-up   Anxiety   Depression   ADD   Medication Refill   Discussed the use of AI scribe software for clinical note transcription with the patient, who gave verbal consent to proceed.  History of Present Illness Caitlyn Harris is a 43 year old Caucasian female, married, employed, lives in [place], has a history of ADD, GAD, ADHD, IBS, migraine headaches, hysterectomy was evaluated by telemedicine today.  She reports that she recently reduced sertraline  to 50 mg last week and then to 25 mg, while starting amitriptyline 10 mg nightly for the past week. She reports that amitriptyline helps her fall asleep and sleep through the night, noting improvement in sleep quality. She states that she previously used Ambien  as needed but no longer takes it.  Regarding depressive symptoms, she describes improvement in energy and motivation over the past 6 days, which she relates to the  medication changes. She notes that before the adjustment, she frequently experienced headaches, fatigue, and a desire to nap after work, but she has not needed to nap recently. She denies changes in appetite and denies feelings of worthlessness or failure.  She continues to have persistent difficulty with concentration at work, and she states this occurs every day. She recently resumed Adderall, taking it in the morning, and notes that while concentration improves with the medication, she continues to experience issues.  She has not been taking the afternoon dose at all, has been noncompliant.  She picked up her Adderall prescription yesterday and restarted it either yesterday or this morning.  She denies suicidal ideation and denies thoughts of harming herself or others.    Visit Diagnosis:    ICD-10-CM   1. Recurrent major depressive disorder, in partial remission  F33.41 amitriptyline (ELAVIL) 25 MG tablet    2. GAD (generalized anxiety disorder)  F41.1 amitriptyline (ELAVIL) 25 MG tablet    3. Attention deficit hyperactivity disorder (ADHD), combined type  F90.2 amphetamine -dextroamphetamine  (ADDERALL XR) 30 MG 24 hr capsule    amphetamine -dextroamphetamine  (ADDERALL) 10 MG tablet    4. Insomnia due to medical condition  G47.01    Mood symptoms, headaches      Past Psychiatric History: I have reviewed past psychiatric history from progress note on 11/15/2021.  Past trials of medications like Celexa , Ambien , ADHD medications-Vyvanse  and several others.  Patient had ADHD testing-Dr. Frederic Kitty dated 01/19/2022-meets criteria for ADHD combined type.  Completed [facility] IOP-November 2023.  Past Medical History:  Past Medical History:  Diagnosis Date  Anxiety    Depression    Endometriosis    GERD (gastroesophageal reflux disease)    OCC   History of PCOS    Hyperlipidemia    Migraine    Seizures (HCC)    FEBRILE AS A BABY    Past Surgical History:  Procedure Laterality Date    ABDOMINAL HYSTERECTOMY     CERVICAL BIOPSY  W/ LOOP ELECTRODE EXCISION     COLONOSCOPY WITH PROPOFOL  N/A 01/16/2022   Procedure: COLONOSCOPY WITH PROPOFOL ;  Surgeon: Therisa Bi, MD;  Location: [facility] ENDOSCOPY;  Service: Gastroenterology;  Laterality: N/A;   ECTOPIC PREGNANCY SURGERY     endometirosis     ESOPHAGOGASTRODUODENOSCOPY (EGD) WITH PROPOFOL  N/A 01/16/2022   Procedure: ESOPHAGOGASTRODUODENOSCOPY (EGD) WITH PROPOFOL ;  Surgeon: Therisa Bi, MD;  Location: [facility] ENDOSCOPY;  Service: Gastroenterology;  Laterality: N/A;   HAND SURGERY     THUMB SURGERY   LAPAROSCOPIC OVARIAN CYSTECTOMY Left 03/02/2018   Procedure: LAPAROSCOPIC RIGHT OVARIAN CYSTECTOMY;  Surgeon: Arloa Lamar SQUIBB, MD;  Location: [facility] ORS;  Service: Gynecology;  Laterality: Left;   LAPAROSCOPIC OVARIAN CYSTECTOMY Left 08/30/2019   Procedure: LAPAROSCOPIC OVARIAN CYSTECTOMY;  Surgeon: Arloa Lamar SQUIBB, MD;  Location: [facility] ORS;  Service: Gynecology;  Laterality: Left;   LEEP     LEEP     PERINEOPLASTY N/A 03/02/2018   Procedure: PERINEORRHAPHY;  Surgeon: Arloa Lamar SQUIBB, MD;  Location: [facility] ORS;  Service: Gynecology;  Laterality: N/A;   RECTOCELE REPAIR N/A 03/02/2018   Procedure: POSTERIOR REPAIR (RECTOCELE);  Surgeon: Arloa Lamar SQUIBB, MD;  Location: [facility] ORS;  Service: Gynecology;  Laterality: N/A;   VENTRAL HERNIA REPAIR N/A 03/19/2017   Procedure: HERNIA REPAIR VENTRAL ADULT;  Surgeon: Wonda Charlie BRAVO, MD;  Location: [facility] ORS;  Service: General;  Laterality: N/A;    Family Psychiatric History: I have reviewed family psychiatric history from progress note on 11/15/2021.  Family History:  Family History  Problem Relation Age of Onset   Hypertension Mother    Diabetes Mother    CVA Mother    Chronic Renal Failure Mother    Renal cancer Father    Hypertension Father    Post-traumatic stress disorder Brother    Drug abuse Maternal Uncle    Alcohol abuse Maternal Uncle    Colon cancer Paternal Aunt    Bipolar  disorder Half-Sister    Bipolar disorder Niece    Suicidality Other     Social History: I have reviewed social history from progress note on 11/15/2021. Social History   Socioeconomic History   Marital status: Married    Spouse name: Not on file   Number of children: 2   Years of education: Not on file   Highest education level: Master's degree (e.g., MA, MS, MEng, MEd, MSW, MBA)  Occupational History   Not on file  Tobacco Use   Smoking status: Former    Current packs/day: 0.00    Types: Cigarettes    Start date: 03/12/2002    Quit date: 03/12/2009    Years since quitting: 14.9   Smokeless tobacco: Never   Tobacco comments:    1 PACK EVERY 2 WEEKS  Vaping Use   Vaping status: Never Used  Substance and Sexual Activity   Alcohol use: Not Currently    Comment: RARE   Drug use: No   Sexual activity: Yes  Other Topics Concern   Not on file  Social History Narrative   Right handed   Caffeine prn   Two story home  Lives with husband and two sons and mom   Currently Occcupational therapist   Social Drivers of Health   Financial Resource Strain: Low Risk  (06/16/2023)   Overall Financial Resource Strain (CARDIA)    Difficulty of Paying Living Expenses: Not hard at all  Food Insecurity: No Food Insecurity (06/16/2023)   Hunger Vital Sign    Worried About Running Out of Food in the Last Year: Never true    Ran Out of Food in the Last Year: Never true  Transportation Needs: No Transportation Needs (06/16/2023)   PRAPARE - Administrator, Civil Service (Medical): No    Lack of Transportation (Non-Medical): No  Physical Activity: Inactive (06/16/2023)   Exercise Vital Sign    Days of Exercise per Week: 0 days    Minutes of Exercise per Session: 0 min  Stress: Stress Concern Present (06/16/2023)   [employer] of Occupational Health - Occupational Stress Questionnaire    Feeling of Stress : To some extent  Social Connections: Moderately Isolated (06/16/2023)    Social Connection and Isolation Panel    Frequency of Communication with Friends and Family: Three times a week    Frequency of Social Gatherings with Friends and Family: Never    Attends Religious Services: Never    Database Administrator or Organizations: No    Attends Banker Meetings: Never    Marital Status: Married    Allergies:  Allergies  Allergen Reactions   Tape     Low sensitivity to tape, ok with Tegaderm and paper tape     Metabolic Disorder Labs: Lab Results  Component Value Date   HGBA1C 5.2 03/10/2023   Lab Results  Component Value Date   PROLACTIN 13.3 12/21/2017   Lab Results  Component Value Date   CHOL 188 05/12/2023   TRIG (H) 05/12/2023    405.0 Triglyceride is over 400; calculations on Lipids are invalid.   HDL 35.40 (L) 05/12/2023   CHOLHDL 5 05/12/2023   VLDL 81.0 (H) 05/12/2023   LDLCALC 75 06/07/2018   Lab Results  Component Value Date   TSH 1.52 03/10/2023   TSH 1.57 06/26/2021    Therapeutic Level Labs: No results found for: LITHIUM No results found for: VALPROATE No results found for: CBMZ  Current Medications: Current Outpatient Medications  Medication Sig Dispense Refill   amitriptyline (ELAVIL) 25 MG tablet Take 1 tablet (25 mg total) by mouth at bedtime. 30 tablet 1   acetaminophen  (TYLENOL ) 500 MG tablet Take 1,000 mg by mouth every 6 (six) hours as needed for moderate pain or headache.     amphetamine -dextroamphetamine  (ADDERALL XR) 30 MG 24 hr capsule Take 1 capsule (30 mg total) by mouth every morning. No refills without UDS     amphetamine -dextroamphetamine  (ADDERALL) 10 MG tablet Take 1 tablet (10 mg total) by mouth daily after lunch. NO REFILLS WITHOUT UDS     cholecalciferol (VITAMIN D3) 25 MCG (1000 UNIT) tablet Take 5,000 Units by mouth daily.     doxycycline (VIBRAMYCIN) 100 MG capsule Take 100 mg by mouth 2 (two) times daily.     Fremanezumab -vfrm (AJOVY ) 225 MG/1.5ML SOAJ Inject 225 mg into the  skin every 28 (twenty-eight) days. 1.68 mL 11   hydrOXYzine  (VISTARIL ) 25 MG capsule Take 1-2 capsules (25-50 mg total) by mouth at bedtime as needed. For sleep and anxiety 180 capsule 0   ibuprofen  (ADVIL ) 400 MG tablet Take 400 mg by mouth every 6 (six) hours as needed.  Norethindrone -Ethinyl Estradiol-Fe Biphas (LO LOESTRIN FE) 1 MG-10 MCG / 10 MCG tablet Take 1 tablet by mouth daily. 84 tablet 3   omeprazole  (PRILOSEC) 20 MG capsule TAKE 1 CAPSULE (20 MG TOTAL) BY MOUTH 2 (TWO) TIMES DAILY BEFORE A MEAL. 180 capsule 3   ondansetron  (ZOFRAN -ODT) 8 MG disintegrating tablet TAKE 1 TABLET BY MOUTH EVERY 8 HOURS AS NEEDED FOR NAUSEA OR VOMITING. 18 tablet 1   SUMAtriptan  (IMITREX ) 100 MG tablet TAKE 1 TABLET BY MOUTH AT EARLIEST ONSET OF MIGRAINE. MAY REPEAT IN 2 HOURS. MAX 1 TAB/24 HOURS 10 tablet 5   tiZANidine  (ZANAFLEX ) 4 MG tablet Take 1 tablet (4 mg total) by mouth every 6 (six) hours as needed for muscle spasms. 30 tablet 5   topiramate  (TOPAMAX ) 100 MG tablet TAKE 1 TABLET BY MOUTH TWICE A DAY 180 tablet 1   zolpidem  (AMBIEN ) 5 MG tablet Take 0.5-1 tablets (2.5-5 mg total) by mouth at bedtime as needed for sleep. 30 tablet 0   No current facility-administered medications for this visit.     Musculoskeletal: Strength & Muscle Tone: UTA Gait & Station: Seated Patient leans: N/A  Psychiatric Specialty Exam: Review of Systems  Psychiatric/Behavioral:  Positive for decreased concentration and sleep disturbance.     There were no vitals taken for this visit.There is no height or weight on file to calculate BMI.  General Appearance: Casual  Eye Contact:  Fair  Speech:  Clear and Coherent  Volume:  Normal  Mood:  Euthymic  Affect:  Full Range  Thought Process:  Goal Directed and Descriptions of Associations: Intact  Orientation:  Full (Time, Place, and Person)  Thought Content: Logical   Suicidal Thoughts:  No  Homicidal Thoughts:  No  Memory:  Immediate;   Fair Recent;    Fair Remote;   Fair  Judgement:  Fair  Insight:  Fair  Psychomotor Activity:  Normal  Concentration:  Concentration: Fair and Attention Span: Fair  Recall:  [employer] of Knowledge: Fair  Language: Fair  Akathisia:  No  Handed:  Right  AIMS (if indicated): not done  Assets:  Communication Skills Desire for Improvement Housing Social Support Talents/Skills Transportation  ADL's:  Intact  Cognition: WNL  Sleep:  Improving   Screenings: GAD-7    Loss Adjuster, Chartered Office Visit from 08/19/2023 in [facility] [place] Counselor from 06/16/2023 in [facility] Office Visit from 05/06/2023 in [facility] Visit from 03/11/2023 in [facility] Office Visit from 12/25/2022 in [facility]  Total GAD-7 Score 9 8 7 8 13    PHQ2-9    Flowsheet Row Video Visit from 02/10/2024 in [facility] Office Visit from 08/19/2023 in [facility] [place] [facility] from 06/16/2023 in [facility] Office Visit from 05[facility]06/2023 in [facility] Office Visit from 03[facility]11/2023 in [facility]  PHQ-2 Total Score 2 4 6 4 3   PHQ-9 Total Score 6 13 23 15 18    Flowsheet Row Video Visit from 02/10/2024 in [facility] Video Visit from 02/01/2024 in [facility] Office Visit from 11/04/2023 in [facility]  C-SSRS RISK CATEGORY No Risk No Risk No Risk     Assessment and Plan: NOUR RODRIGUES is a 43 year old Caucasian  female who presented for a follow-up appointment, discussed assessment and plan as noted below.    1. Recurrent major  depressive disorder, in partial remission Currently tolerating amitriptyline well and reports doing better with regards to mood symptoms since being on it. Increase Amitriptyline to 25 mg daily Discontinue Sertraline  25 mg daily, taper off.  2. GAD (generalized anxiety disorder)-improving Currently reports anxiety symptoms as manageable. Increase Amitriptyline to 25 mg daily.  3. Attention deficit hyperactivity disorder (ADHD), combined type-improving Reports continues to struggle with concentration problems however the Adderall does help when she takes it although she has not been consistently taking it. Continue Adderall XR 30 mg daily Continue Adderall 10 mg daily in the afternoon as needed, she has been noncompliant. Encourage compliance. Patient advised to get urine drug screen completed prior to refilling Adderall. Reviewed [place]MP AWARxE  4. Insomnia due to medical condition-improving Currently reports sleep is better on the amitriptyline. Continue sleep hygiene techniques Continue Amitriptyline as prescribed. Does have Ambien  available 2.5-5 mg at bedtime as needed. Continue Hydroxyzine  25-50 mg at bedtime as needed.  Follow-up Follow-up in clinic in 2 weeks or sooner if needed.    Collaboration of Care: Collaboration of Care: Referral or follow-up with counselor/therapist AEB referred to psychotherapist-pending.  Patient/Guardian was advised Release of Information must be obtained prior to any record release in order to collaborate their care with an outside provider. Patient/Guardian was advised if they have not already done so to contact the registration department to sign all necessary forms in order for us  to release information regarding their care.   Consent: Patient/Guardian gives verbal consent for treatment and assignment of benefits for services provided during this visit. Patient/Guardian expressed understanding and agreed to proceed.   This note was generated in  part or whole with voice recognition software. Voice recognition is usually quite accurate but there are transcription errors that can and very often do occur. I apologize for any typographical errors that were not detected and corrected.    Gareth Fitzner, MD 02/11/2024, 9:50 AM

## 2024-02-26 ENCOUNTER — Encounter: Payer: Self-pay | Admitting: Psychiatry

## 2024-02-26 ENCOUNTER — Telehealth: Admitting: Psychiatry

## 2024-02-26 DIAGNOSIS — G4701 Insomnia due to medical condition: Secondary | ICD-10-CM | POA: Diagnosis not present

## 2024-02-26 DIAGNOSIS — F3341 Major depressive disorder, recurrent, in partial remission: Secondary | ICD-10-CM

## 2024-02-26 DIAGNOSIS — F411 Generalized anxiety disorder: Secondary | ICD-10-CM | POA: Diagnosis not present

## 2024-02-26 DIAGNOSIS — F902 Attention-deficit hyperactivity disorder, combined type: Secondary | ICD-10-CM | POA: Diagnosis not present

## 2024-02-26 NOTE — Progress Notes (Signed)
 Virtual Visit via Video Note  I connected with Caitlyn Harris on 02/26/24 at 11:30 AM EST by a video enabled telemedicine application and verified that I am speaking with the correct person using two identifiers.  Location Provider Location : [facility] Patient Location : Car  Participants: Patient , Provider   I discussed the limitations of evaluation and management by telemedicine and the availability of in person appointments. The patient expressed understanding and agreed to proceed.   I discussed the assessment and treatment plan with the patient. The patient was provided an opportunity to ask questions and all were answered. The patient agreed with the plan and demonstrated an understanding of the instructions.   The patient was advised to call back or seek an in-person evaluation if the symptoms worsen or if the condition fails to improve as anticipated.   BH MD OP Progress Note  02/26/2024 12:13 PM Caitlyn Harris  MRN:  [id number]  Chief Complaint:  Chief Complaint  Patient presents with   Medication Refill   Follow-up   ADHD   Depression   Anxiety   Discussed the use of AI scribe software for clinical note transcription with the patient, who gave verbal consent to proceed.  History of Present Illness Caitlyn Harris is a 43 year old Caucasian female, married, employed, lives in [place], has a history of ADD, GAD, IBS, migraine headaches, hysterectomy, was evaluated by telemedicine today.  Over the past week, she has experienced increased irritability. She reports that this change coincided with transitioning off sertraline  and increasing amitriptyline , and she notes similar symptoms during previous medication changes. She denies experiencing other depressive symptoms aside from irritability and states that her sleep has been okay.  She describes dry mouth, and she associates it with her current medication regimen.   She denies any thoughts of harming herself or  others.  She plans to visit her brother for the holidays and reports having a date night scheduled.  She denies any suicidality, homicidality or perceptual disturbances.  She has been noncompliant with urine drug screen however agrees to get it done soon.  She continues to use Adderall for her ADHD symptoms and denies any concerns.  She continues to have migraine headaches, had a headache yesterday although that may have been likely due to sinusitis.  Reports headache has since resolved.  Visit Diagnosis:    ICD-10-CM   1. Recurrent major depressive disorder, in partial remission  F33.41     2. GAD (generalized anxiety disorder)  F41.1     3. Attention deficit hyperactivity disorder (ADHD), combined type  F90.2     4. Insomnia due to medical condition  G47.01    Mood symptoms, headaches      Past Psychiatric History: I have reviewed past psychiatric history from progress note on 11/15/2021.  Past trials of medications like Celexa , Ambien , ADHD medications-Vyvanse  and several others.  ADHD testing completed-Dr. Frederic Fire dated 01/19/2022-meets criteria for ADHD combined type.  Completed MH IOP-November 2023.  Past Medical History:  Past Medical History:  Diagnosis Date   Anxiety    Depression    Endometriosis    GERD (gastroesophageal reflux disease)    OCC   History of PCOS    Hyperlipidemia    Migraine    Seizures (HCC)    FEBRILE AS A BABY    Past Surgical History:  Procedure Laterality Date   ABDOMINAL HYSTERECTOMY     CERVICAL BIOPSY  W/ LOOP ELECTRODE EXCISION     COLONOSCOPY  WITH PROPOFOL  N/A 01/16/2022   Procedure: COLONOSCOPY WITH PROPOFOL ;  Surgeon: Therisa Bi, MD;  Location: [facility] END[facility]COPY;  Service: Gastroenterology;  Laterality: N/A;   ECTOPIC PREGNANCY SURGERY     endometirosis     ESOPHAGOGASTRODUODENOSCOPY (EGD) WITH PROPOFOL  N/A 01/16/2022   Procedure: ESOPHAGOGASTRODUODENOSCOPY (EGD) WITH PROPOFOL ;  Surgeon: Therisa Bi, MD;  Location: [facility]  ENDOSCOPY;  Service: Gastroenterology;  Laterality: N/A;   HAND SURGERY     THUMB SURGERY   LAPAROSCOPIC OVARIAN CYSTECTOMY Left 03/02/2018   Procedure: LAPAROSCOPIC RIGHT OVARIAN CYSTECTOMY;  Surgeon: Arloa Lamar SQUIBB, MD;  Location: [facility] ORS;  Service: Gynecology;  Laterality: Left;   LAPAROSCOPIC OVARIAN CYSTECTOMY Left 08/30/2019   Procedure: LAPAROSCOPIC OVARIAN CYSTECTOMY;  Surgeon: Arloa Lamar SQUIBB, MD;  Location: [facility] ORS;  Service: Gynecology;  Laterality: Left;   LEEP     LEEP     PERINEOPLASTY N/A 03/02/2018   Procedure: PERINEORRHAPHY;  Surgeon: Arloa Lamar SQUIBB, MD;  Location: [facility] ORS;  Service: Gynecology;  Laterality: N/A;   RECTOCELE REPAIR N/A 03/02/2018   Procedure: POSTERIOR REPAIR (RECTOCELE);  Surgeon: Arloa Lamar SQUIBB, MD;  Location: [facility] ORS;  Service: Gynecology;  Laterality: N/A;   VENTRAL HERNIA REPAIR N/A 03/19/2017   Procedure: HERNIA REPAIR VENTRAL ADULT;  Surgeon: Wonda Charlie BRAVO, MD;  Location: [facility] ORS;  Service: General;  Laterality: N/A;    Family Psychiatric History: I have reviewed family psychiatric history from progress note on 11/15/2021.  Family History:  Family History  Problem Relation Age of Onset   Hypertension Mother    Diabetes Mother    CVA Mother    Chronic Renal Failure Mother    Renal cancer Father    Hypertension Father    Post-traumatic stress disorder Brother    Drug abuse Maternal Uncle    Alcohol abuse Maternal Uncle    Colon cancer Paternal Aunt    Bipolar disorder Half-Sister    Bipolar disorder Niece    Suicidality Other     Social History: I have reviewed social history from progress note on 11/15/2021. Social History   Socioeconomic History   Marital status: Married    Spouse name: Not on file   Number of children: 2   Years of education: Not on file   Highest education level: Master's degree (e.g., MA, MS, MEng, MEd, MSW, MBA)  Occupational History   Not on file  Tobacco Use   Smoking status: Former     Current packs/day: 0.00    Types: Cigarettes    Start date: 03/12/2002    Quit date: 03/12/2009    Years since quitting: 14.9   Smokeless tobacco: Never   Tobacco comments:    1 PACK EVERY 2 WEEKS  Vaping Use   Vaping status: Never Used  Substance and Sexual Activity   Alcohol use: Not Currently    Comment: RARE   Drug use: No   Sexual activity: Yes  Other Topics Concern   Not on file  Social History Narrative   Right handed   Caffeine prn   Two story home   Lives with husband and two sons and mom   Currently Occcupational therapist   Social Drivers of Health   Financial Resource Strain: Low Risk  (06/16/2023)   Overall Financial Resource Strain (CARDIA)    Difficulty of Paying Living Expenses: Not hard at all  Food Insecurity: No Food Insecurity (06/16/2023)   Hunger Vital Sign    Worried About Running Out of Food in the Last Year: Never true  Ran Out of Food in the Last Year: Never true  Transportation Needs: No Transportation Needs (06/16/2023)   PRAPARE - Administrator, Civil Service (Medical): No    Lack of Transportation (Non-Medical): No  Physical Activity: Inactive (06/16/2023)   Exercise Vital Sign    Days of Exercise per Week: 0 days    Minutes of Exercise per Session: 0 min  Stress: Stress Concern Present (06/16/2023)   [employer] of Occupational Health - Occupational Stress Questionnaire    Feeling of Stress : To some extent  Social Connections: Moderately Isolated (06/16/2023)   Social Connection and Isolation Panel    Frequency of Communication with Friends and Family: Three times a week    Frequency of Social Gatherings with Friends and Family: Never    Attends Religious Services: Never    Database Administrator or Organizations: No    Attends Banker Meetings: Never    Marital Status: Married    Allergies:  Allergies  Allergen Reactions   Tape     Low sensitivity to tape, ok with Tegaderm and paper tape      Metabolic Disorder Labs: Lab Results  Component Value Date   HGBA1C 5.2 03/10/2023   Lab Results  Component Value Date   PROLACTIN 13.3 12/21/2017   Lab Results  Component Value Date   CHOL 188 05/12/2023   TRIG (H) 05/12/2023    405.0 Triglyceride is over 400; calculations on Lipids are invalid.   HDL 35.40 (L) 05/12/2023   CHOLHDL 5 05/12/2023   VLDL 81.0 (H) 05/12/2023   LDLCALC 75 06/07/2018   Lab Results  Component Value Date   TSH 1.52 03/10/2023   TSH 1.57 06/26/2021    Therapeutic Level Labs: No results found for: LITHIUM No results found for: VALPROATE No results found for: CBMZ  Current Medications: Current Outpatient Medications  Medication Sig Dispense Refill   acetaminophen  (TYLENOL ) 500 MG tablet Take 1,000 mg by mouth every 6 (six) hours as needed for moderate pain or headache.     amitriptyline  (ELAVIL ) 25 MG tablet Take 1 tablet (25 mg total) by mouth at bedtime. 30 tablet 1   amphetamine -dextroamphetamine  (ADDERALL XR) 30 MG 24 hr capsule Take 1 capsule (30 mg total) by mouth every morning. No refills without UDS     amphetamine -dextroamphetamine  (ADDERALL) 10 MG tablet Take 1 tablet (10 mg total) by mouth daily after lunch. NO REFILLS WITHOUT UDS     cholecalciferol (VITAMIN D3) 25 MCG (1000 UNIT) tablet Take 5,000 Units by mouth daily.     doxycycline (VIBRAMYCIN) 100 MG capsule Take 100 mg by mouth 2 (two) times daily.     Fremanezumab -vfrm (AJOVY ) 225 MG/1.5ML SOAJ Inject 225 mg into the skin every 28 (twenty-eight) days. 1.68 mL 11   hydrOXYzine  (VISTARIL ) 25 MG capsule Take 1-2 capsules (25-50 mg total) by mouth at bedtime as needed. For sleep and anxiety 180 capsule 0   ibuprofen  (ADVIL ) 400 MG tablet Take 400 mg by mouth every 6 (six) hours as needed.     Norethindrone -Ethinyl Estradiol-Fe Biphas (LO LOESTRIN FE) 1 MG-10 MCG / 10 MCG tablet Take 1 tablet by mouth daily. 84 tablet 3   omeprazole  (PRILOSEC) 20 MG capsule TAKE 1 CAPSULE  (20 MG TOTAL) BY MOUTH 2 (TWO) TIMES DAILY BEFORE A MEAL. 180 capsule 3   ondansetron  (ZOFRAN -ODT) 8 MG disintegrating tablet TAKE 1 TABLET BY MOUTH EVERY 8 HOURS AS NEEDED FOR NAUSEA OR VOMITING. 18 tablet 1  SUMAtriptan  (IMITREX ) 100 MG tablet TAKE 1 TABLET BY MOUTH AT EARLIEST ONSET OF MIGRAINE. MAY REPEAT IN 2 HOURS. MAX 1 TAB/24 HOURS 10 tablet 5   tiZANidine  (ZANAFLEX ) 4 MG tablet Take 1 tablet (4 mg total) by mouth every 6 (six) hours as needed for muscle spasms. 30 tablet 5   topiramate  (TOPAMAX ) 100 MG tablet TAKE 1 TABLET BY MOUTH TWICE A DAY 180 tablet 1   zolpidem  (AMBIEN ) 5 MG tablet Take 0.5-1 tablets (2.5-5 mg total) by mouth at bedtime as needed for sleep. 30 tablet 0   No current facility-administered medications for this visit.     Musculoskeletal: Strength & Muscle Tone: UTA Gait & Station: Seated Patient leans: N/A  Psychiatric Specialty Exam: Review of Systems  Psychiatric/Behavioral:         Irritable    There were no vitals taken for this visit.There is no height or weight on file to calculate BMI.  General Appearance: Casual  Eye Contact:  Fair  Speech:  Clear and Coherent  Volume:  Normal  Mood:  Irritable  Affect:  Appropriate  Thought Process:  Goal Directed and Descriptions of Associations: Intact  Orientation:  Full (Time, Place, and Person)  Thought Content: Logical   Suicidal Thoughts:  No  Homicidal Thoughts:  No  Memory:  Immediate;   Fair Recent;   Fair Remote;   Fair  Judgement:  Fair  Insight:  Fair  Psychomotor Activity:  Normal  Concentration:  Concentration: Fair and Attention Span: Fair  Recall:  [employer] of Knowledge: Fair  Language: Fair  Akathisia:  No  Handed:  Right  AIMS (if indicated): not done  Assets:  Communication Skills Desire for Improvement Housing Social Support  ADL's:  Intact  Cognition: WNL  Sleep:  Improving   Screenings: GAD-7    Flowsheet Row Office Visit from 08/19/2023 in [facility] [place] [facility] from 06/16/2023 in [facility] Office Visit from 05/06/2023 in [facility] Office Visit from 03/11/2023 in [facility] Office Visit from 12/25/2022 in [facility]  Total GAD-7 Score 9 8 7 8 13    PHQ2-9    Flowsheet Row Video Visit from 02/10/2024 in [facility] Office Visit from 08/19/2023 in [facility] [place] [facility] from 06/16/2023 in [facility] Office Visit from 05/06/2023 in [facility] Office Visit from 03/11/2023 in [facility]  PHQ-2 Total Score 2 4 6 4 3   PHQ-9 Total Score 6 13 23 15 18    Flowsheet Row Video Visit from 02/26/2024 in [facility] Video Visit from 02/10/2024 in [facility] Video Visit from 02/01/2024 in [facility]  C-SSRS RISK CATEGORY No Risk No Risk No Risk     Assessment and Plan: Caitlyn Harris is a 43 year old Caucasian female who presented for a follow-up appointment.  Discussed assessment and plan as noted below.  1. Recurrent major depressive disorder, in partial remission Currently reports irritability likely multifactorial including recent headache, being weaned off of sertraline . Continue Amitriptyline  25 mg daily. Will give it more time prior to further dosage increase. Discontinue Sertraline , currently completely off of it. Dry mouth likely due to medications, will reevaluate in future sessions. In the meantime could use sugar-free lozenges/gums,  take sips of water  as well as could use mouthwash-Biotene.  2. GAD (generalized anxiety  disorder)-improving Currently reports better anxiety management. Continue Amitriptyline  25 mg daily  3. Attention deficit hyperactivity disorder (ADHD), combined type-improving Reports attention and focus is good on the Adderall. Continue Adderall XR 30 mg daily Continue Adderall 10 mg daily in the afternoon as needed. Reviewed [place] PMP AWARxE Encouraged to get urine drug screen which is still pending, will needed prior to future refills of Adderall.  Patient voiced understanding.  4. Insomnia due to medical condition-improving Currently reports sleep is better. Continue Amitriptyline  as prescribed Continue Hydroxyzine  25 to 50 mg at bedtime as needed Does have Ambien  available 2.5-5 mg at bedtime as needed.  Follow-up Follow-up in clinic in 4 weeks or sooner in person.    Collaboration of Care: Collaboration of Care: Referral or follow-up with counselor/therapist AEB referred for psychotherapy sessions-pending.  Patient/Guardian was advised Release of Information must be obtained prior to any record release in order to collaborate their care with an outside provider. Patient/Guardian was advised if they have not already done so to contact the registration department to sign all necessary forms in order for us  to release information regarding their care.   Consent: Patient/Guardian gives verbal consent for treatment and assignment of benefits for services provided during this visit. Patient/Guardian expressed understanding and agreed to proceed.  This note was generated in part or whole with voice recognition software. Voice recognition is usually quite accurate but there are transcription errors that can and very often do occur. I apologize for any typographical errors that were not detected and corrected.     Nikan Ellingson, MD 02/26/2024, 12:13 PM

## 2024-03-07 ENCOUNTER — Ambulatory Visit: Payer: Self-pay

## 2024-03-07 NOTE — Telephone Encounter (Signed)
 FYI Only or Action Required?: FYI only for provider: appointment scheduled on 03/08/24.  Patient was last seen in primary care on 08/19/2023 by Randeen Laine LABOR, MD.  Called Nurse Triage reporting Sore Throat and Cough.  Symptoms began several days ago.  Interventions attempted: OTC medications: Cough and cold syrup, acetaminophen  and Rest, hydration, or home remedies.  Symptoms are: unchanged.  Triage Disposition: See Physician Within 24 Hours  Patient/caregiver understands and will follow disposition?: Yes  Copied from CRM #[id number]. Topic: Clinical - Red Word Triage >> Mar 07, 2024  8:09 AM Amy B wrote: Red Word that prompted transfer to Nurse Triage: Severe throat, pain with swallowing for three days, cough with shortness of breath Reason for Disposition  SEVERE throat pain (e.g., excruciating)  Answer Assessment - Initial Assessment Questions 1. ONSET: When did the throat start hurting? (Hours or days ago)      Friday  2. SEVERITY: How bad is the sore throat? (Scale 1-10; mild, moderate or severe)     Controlled with cold medicine. 3/10, without medicine severe 10/10 3. STREP EXPOSURE: Has there been any exposure to strep within the past week? If Yes, ask: What type of contact occurred?      Works in school 4.  VIRAL SYMPTOMS: Are there any symptoms of a cold, such as a runny nose, cough, hoarse voice or red eyes?      Cough 5. FEVER: Do you have a fever? If Yes, ask: What is your temperature, how was it measured, and when did it start?     Denies  6. PUS ON THE TONSILS: Is there pus on the tonsils in the back of your throat?     denies 7. OTHER SYMPTOMS: Do you have any other symptoms? (e.g., difficulty breathing, headache, rash)      Short of breath when climbing stairs  8. PREGNANCY: Is there any chance you are pregnant? When was your last menstrual period?  Protocols used: Sore Throat-A-AH

## 2024-03-08 ENCOUNTER — Encounter: Payer: Self-pay | Admitting: Family Medicine

## 2024-03-08 ENCOUNTER — Ambulatory Visit: Admitting: Family Medicine

## 2024-03-08 VITALS — BP 124/78 | HR 89 | Temp 98.3°F | Ht 73.0 in | Wt 223.0 lb

## 2024-03-08 DIAGNOSIS — R051 Acute cough: Secondary | ICD-10-CM | POA: Diagnosis not present

## 2024-03-08 DIAGNOSIS — J208 Acute bronchitis due to other specified organisms: Secondary | ICD-10-CM | POA: Diagnosis not present

## 2024-03-08 DIAGNOSIS — J029 Acute pharyngitis, unspecified: Secondary | ICD-10-CM

## 2024-03-08 LAB — POCT RAPID STREP A (OFFICE): Rapid Strep A Screen: NEGATIVE

## 2024-03-08 MED ORDER — ALBUTEROL SULFATE HFA 108 (90 BASE) MCG/ACT IN AERS: 2.0000 | INHALATION_SPRAY | RESPIRATORY_TRACT | 3 refills | Status: DC | PRN

## 2024-03-08 MED ORDER — PROMETHAZINE-DM 6.25-15 MG/5ML PO SYRP: 5.0000 mL | ORAL_SOLUTION | Freq: Every evening | ORAL | 0 refills | Status: AC | PRN

## 2024-03-08 MED ORDER — PREDNISONE 20 MG PO TABS: 20.0000 mg | ORAL_TABLET | Freq: Every day | ORAL | 0 refills | Status: DC

## 2024-03-08 NOTE — Patient Instructions (Addendum)
 Drink fluids and rest  mucinex  DM is good for cough and congestion for day Try the prometh  dm for night time  An antihistamine like zyrtec 10 mg - can help with drip/runny nose and cough  Albuterol  inhaler for wheezing if needed Take prednisone  20 mg daily for wheezing and congestion  Nasal saline for congestion as needed (or netti pot)  Tylenol   or ibuprofen   for fever or pain or headache  Please alert us  if symptoms worsen (if severe or short of breath please go to the ER)

## 2024-03-08 NOTE — Progress Notes (Signed)
 Subjective:    Patient ID: Caitlyn Harris, female    DOB: May 10, 1980, 43 y.o.   MRN: [id number]  HPI  Wt Readings from Last 3 Encounters:  03/08/24 223 lb (101.2 kg)  10/20/23 232 lb (105.2 kg)  09/15/23 237 lb (107.5 kg)   29.42 kg/m  Vitals:   03/08/24 1218  BP: 124/78  Pulse: 89  Temp: 98.3 F (36.8 C)  SpO2: 100%    Pt presents with c/o  Cough ST  Started on Friday Woke up with ST and pnd  That got worse  Now has a cough Some phlegm -tan in color  More shortness of breath and fatigued (? A little wheezy) Sore from cough   No fever at home   Sneezed and that stressed her back   Some headache/pressure  Nasal mucous is tan    Covid test neg at home  Takes care of immunocompromised people   Youngest son is sick- 101 fever on Sunday    Over the counter Multi symptom  Aleve cold and sinus  No expectorant  No nasal sprays / has not used netti pot    Results for orders placed or performed in visit on 03/08/24  Rapid Strep A   Collection Time: 03/08/24 12:34 PM  Result Value Ref Range   Rapid Strep A Screen Negative Negative      Patient Active Problem List   Diagnosis Date Noted   Acute bronchitis, viral 03/08/2024   Enterocele 09/16/2023   Night sweats 08/19/2023   MDD (major depressive disorder), recurrent episode, mild 08/07/2023   Vaginal discharge 03/10/2023   Diabetes mellitus screening 03/10/2023   Attention deficit hyperactivity disorder (ADHD), combined type 07/23/2022   High risk medication use 07/23/2022   Stress incontinence 02/26/2022   Adenomatous polyp of colon    Dyspepsia    Low back pain 12/19/2021   MDD (major depressive disorder), recurrent severe, without psychosis (HCC) 11/15/2021   Bereavement 11/15/2021   Anxiety 08/21/2021   Attention and concentration deficit 08/21/2021   Cervical lymphadenopathy 07/09/2021   Nausea 07/07/2021   Abdominal pain 06/26/2021   Bloating 06/26/2021   Heartburn 06/26/2021    Obesity (BMI 30-39.9) 04/16/2020   Ovarian cyst, left 08/30/2019   Hypertriglyceridemia 06/11/2018   Endometriosis 02/01/2018   Chronic female pelvic pain 02/01/2018   Rectocele 01/18/2018   Insomnia 08/25/2017   Cystocele, midline 08/25/2017   Chronic migraine without aura without status migrainosus, not intractable 02/04/2016   Depression 02/04/2016   GAD (generalized anxiety disorder) 02/04/2016   Vitamin D  deficiency, unspecified 02/04/2016   Past Medical History:  Diagnosis Date   Anxiety    Depression    Endometriosis    GERD (gastroesophageal reflux disease)    OCC   History of PCOS    Hyperlipidemia    Migraine    Seizures (HCC)    FEBRILE AS A BABY   Past Surgical History:  Procedure Laterality Date   ABDOMINAL HYSTERECTOMY     CERVICAL BIOPSY  W/ LOOP ELECTRODE EXCISION     COLONOSCOPY WITH PROPOFOL  N/A 01/16/2022   Procedure: COLONOSCOPY WITH PROPOFOL ;  Surgeon: Therisa Bi, MD;  Location: [facility] ENDOSCOPY;  Service: Gastroenterology;  Laterality: N/A;   ECTOPIC PREGNANCY SURGERY     endometirosis     ESOPHAGOGASTRODUODENOSCOPY (EGD) WITH PROPOFOL  N/A 01/16/2022   Procedure: ESOPHAGOGASTRODUODENOSCOPY (EGD) WITH PROPOFOL ;  Surgeon: Therisa Bi, MD;  Location: [facility] ENDOSCOPY;  Service: Gastroenterology;  Laterality: N/A;   HAND SURGERY  THUMB SURGERY   LAPAROSCOPIC OVARIAN CYSTECTOMY Left 03/02/2018   Procedure: LAPAROSCOPIC RIGHT OVARIAN CYSTECTOMY;  Surgeon: Arloa Lamar SQUIBB, MD;  Location: [facility] ORS;  Service: Gynecology;  Laterality: Left;   LAPAROSCOPIC OVARIAN CYSTECTOMY Left 08/30/2019   Procedure: LAPAROSCOPIC OVARIAN CYSTECTOMY;  Surgeon: Arloa Lamar SQUIBB, MD;  Location: [facility] ORS;  Service: Gynecology;  Laterality: Left;   LEEP     LEEP     PERINEOPLASTY N/A 03/02/2018   Procedure: PERINEORRHAPHY;  Surgeon: Arloa Lamar SQUIBB, MD;  Location: [facility] ORS;  Service: Gynecology;  Laterality: N/A;   RECTOCELE REPAIR N/A 03/02/2018   Procedure: POSTERIOR REPAIR  (RECTOCELE);  Surgeon: Arloa Lamar SQUIBB, MD;  Location: [facility] ORS;  Service: Gynecology;  Laterality: N/A;   VENTRAL HERNIA REPAIR N/A 03/19/2017   Procedure: HERNIA REPAIR VENTRAL ADULT;  Surgeon: Wonda Charlie BRAVO, MD;  Location: [facility] ORS;  Service: General;  Laterality: N/A;   Social History   Tobacco Use   Smoking status: Former    Current packs/day: 0.00    Types: Cigarettes    Start date: 03/12/2002    Quit date: 03/12/2009    Years since quitting: 15.0   Smokeless tobacco: Never   Tobacco comments:    1 PACK EVERY 2 WEEKS  Vaping Use   Vaping status: Never Used  Substance Use Topics   Alcohol use: Not Currently    Comment: RARE   Drug use: No   Family History  Problem Relation Age of Onset   Hypertension Mother    Diabetes Mother    CVA Mother    Chronic Renal Failure Mother    Renal cancer Father    Hypertension Father    Post-traumatic stress disorder Brother    Drug abuse Maternal Uncle    Alcohol abuse Maternal Uncle    Colon cancer Paternal Aunt    Bipolar disorder Half-Sister    Bipolar disorder Niece    Suicidality Other    Allergies  Allergen Reactions   Tape     Low sensitivity to tape, ok with Tegaderm and paper tape    Current Outpatient Medications on File Prior to Visit  Medication Sig Dispense Refill   acetaminophen  (TYLENOL ) 500 MG tablet Take 1,000 mg by mouth every 6 (six) hours as needed for moderate pain or headache.     amitriptyline  (ELAVIL ) 25 MG tablet Take 1 tablet (25 mg total) by mouth at bedtime. 30 tablet 1   amphetamine -dextroamphetamine  (ADDERALL XR) 30 MG 24 hr capsule Take 1 capsule (30 mg total) by mouth every morning. No refills without UDS     cholecalciferol (VITAMIN D3) 25 MCG (1000 UNIT) tablet Take 5,000 Units by mouth daily.     Fremanezumab -vfrm (AJOVY ) 225 MG/1.5ML SOAJ Inject 225 mg into the skin every 28 (twenty-eight) days. 1.68 mL 11   ibuprofen  (ADVIL ) 400 MG tablet Take 400 mg by mouth every 6 (six) hours as  needed.     Norethindrone -Ethinyl Estradiol-Fe Biphas (LO LOESTRIN FE) 1 MG-10 MCG / 10 MCG tablet Take 1 tablet by mouth daily. 84 tablet 3   omeprazole  (PRILOSEC) 20 MG capsule TAKE 1 CAPSULE (20 MG TOTAL) BY MOUTH 2 (TWO) TIMES DAILY BEFORE A MEAL. (Patient taking differently: Take 20 mg by mouth daily as needed.) 180 capsule 3   ondansetron  (ZOFRAN -ODT) 8 MG disintegrating tablet TAKE 1 TABLET BY MOUTH EVERY 8 HOURS AS NEEDED FOR NAUSEA OR VOMITING. 18 tablet 1   SUMAtriptan  (IMITREX ) 100 MG tablet TAKE 1 TABLET BY MOUTH AT EARLIEST  ONSET OF MIGRAINE. MAY REPEAT IN 2 HOURS. MAX 1 TAB/24 HOURS 10 tablet 5   tiZANidine  (ZANAFLEX ) 4 MG tablet Take 1 tablet (4 mg total) by mouth every 6 (six) hours as needed for muscle spasms. 30 tablet 5   topiramate  (TOPAMAX ) 100 MG tablet TAKE 1 TABLET BY MOUTH TWICE A DAY 180 tablet 1   zolpidem  (AMBIEN ) 5 MG tablet Take 0.5-1 tablets (2.5-5 mg total) by mouth at bedtime as needed for sleep. 30 tablet 0   hydrOXYzine  (VISTARIL ) 25 MG capsule Take 1-2 capsules (25-50 mg total) by mouth at bedtime as needed. For sleep and anxiety (Patient not taking: Reported on 03/08/2024) 180 capsule 0   No current facility-administered medications on file prior to visit.    Review of Systems  Constitutional:  Positive for appetite change and fatigue. Negative for fever.  HENT:  Positive for congestion, postnasal drip, rhinorrhea, sinus pressure, sneezing and sore throat. Negative for ear pain.   Eyes:  Negative for pain and discharge.  Respiratory:  Positive for cough. Negative for shortness of breath, wheezing and stridor.   Cardiovascular:  Negative for chest pain.  Gastrointestinal:  Negative for diarrhea, nausea and vomiting.  Genitourinary:  Negative for frequency, hematuria and urgency.  Musculoskeletal:  Negative for arthralgias and myalgias.  Skin:  Negative for rash.  Neurological:  Positive for headaches. Negative for dizziness, weakness and light-headedness.   Psychiatric/Behavioral:  Negative for confusion and dysphoric mood.        Objective:   Physical Exam Constitutional:      General: She is not in acute distress.    Appearance: Normal appearance. She is well-developed and normal weight. She is not ill-appearing, toxic-appearing or diaphoretic.  HENT:     Head: Normocephalic and atraumatic.     Comments: Nares are injected and congested    No sinus tenderness    Right Ear: Tympanic membrane, ear canal and external ear normal.     Left Ear: Tympanic membrane, ear canal and external ear normal.     Nose: Congestion and rhinorrhea present.     Mouth/Throat:     Mouth: Mucous membranes are moist.     Pharynx: Oropharynx is clear. No oropharyngeal exudate or posterior oropharyngeal erythema.     Comments: Clear pnd  Eyes:     General:        Right eye: No discharge.        Left eye: No discharge.     Conjunctiva/sclera: Conjunctivae normal.     Pupils: Pupils are equal, round, and reactive to light.  Neck:     Thyroid : No thyromegaly.     Vascular: No carotid bruit or JVD.  Cardiovascular:     Rate and Rhythm: Normal rate and regular rhythm.     Heart sounds: Normal heart sounds.     No gallop.  Pulmonary:     Effort: Pulmonary effort is normal. No respiratory distress.     Breath sounds: No stridor. Wheezing and rhonchi present. No rales.     Comments: Scattered mild rhonchi  Wheeze on forced/end expiration   No rales  Chest:     Chest wall: No tenderness.  Abdominal:     General: There is no distension or abdominal bruit.     Palpations: Abdomen is soft.  Musculoskeletal:     Cervical back: Normal range of motion and neck supple.     Right lower leg: No edema.     Left lower leg: No edema.  Lymphadenopathy:  Cervical: No cervical adenopathy.  Skin:    General: Skin is warm and dry.     Capillary Refill: Capillary refill takes less than 2 seconds.     Coloration: Skin is not pale.     Findings: No rash.   Neurological:     Mental Status: She is alert.     Cranial Nerves: No cranial nerve deficit.     Coordination: Coordination normal.     Deep Tendon Reflexes: Reflexes are normal and symmetric. Reflexes normal.  Psychiatric:        Mood and Affect: Mood normal.           Assessment & Plan:   Problem List Items Addressed This Visit       Respiratory   Acute bronchitis, viral - Primary   With uri symptoms and wheezing  Reassuring exam  Neg strep test (in light of ST) and neg covid test at home  Prescription  Prednisone  20 mg daily 5 d  Albuterol  mdi prn  Prometh  dm for pm cough / caution of sedation   Disc symptomatic care - see instructions on AVS Update if not starting to improve in a week or if worsening  Call back and Er precautions noted in detail today        Other Visit Diagnoses       Acute cough         Sore throat       Relevant Orders   Rapid Strep A (Completed)

## 2024-03-08 NOTE — Assessment & Plan Note (Signed)
 With uri symptoms and wheezing  Reassuring exam  Neg strep test (in light of ST) and neg covid test at home  Prescription  Prednisone  20 mg daily 5 d  Albuterol  mdi prn  Prometh  dm for pm cough / caution of sedation   Disc symptomatic care - see instructions on AVS Update if not starting to improve in a week or if worsening  Call back and Er precautions noted in detail today

## 2024-03-09 ENCOUNTER — Other Ambulatory Visit: Payer: Self-pay | Admitting: Psychiatry

## 2024-03-09 DIAGNOSIS — F3341 Major depressive disorder, recurrent, in partial remission: Secondary | ICD-10-CM

## 2024-03-09 DIAGNOSIS — F411 Generalized anxiety disorder: Secondary | ICD-10-CM

## 2024-03-28 ENCOUNTER — Ambulatory Visit: Admitting: Psychiatry

## 2024-03-29 ENCOUNTER — Ambulatory Visit: Payer: Self-pay

## 2024-03-29 NOTE — Telephone Encounter (Signed)
 FYI Only or Action Required?: FYI: urgent care advised.   Patient was last seen in primary care on 03/08/2024 by Caitlyn Laine LABOR, Caitlyn Harris.  Called Nurse Triage reporting Cough.  Symptoms began several days ago.  Interventions attempted: OTC medications: Dayquil, Nyquil, Prescription medications: albuterol , and Rest, and hydration.  Symptoms are: gradually worsening.  Triage Disposition: See HCP Within 4 Hours (Or PCP Triage) Urgent care  Patient/caregiver understands and will follow disposition?: Yes   Copied from CRM [id number]. Topic: Clinical - Red Word Triage >> Mar 29, 2024  8:16 AM Fonda T wrote: [facility] that prompted transfer to Nurse Triage: Pt calling states she is having a productive constant coughing, with shortness of breath, increased fatigue, and chest hurts from coughing so hard, has been taking over the counter medications and using an inhaler, but not helping.  Pt requesting an appt fro evaluation.    Pt ph. [phone] Reason for Disposition  [1] MILD difficulty breathing (e.g., minimal/no SOB at rest, SOB with walking, pulse < 100) AND [2] still present when not coughing  Answer Assessment - Initial Assessment Questions No appointments are available in clinic today, proceeding to urgent care   1. ONSET: When did the cough begin?      Saturday  2. SEVERITY: How bad is the cough today?      Worsening  3. SPUTUM: Describe the color of your sputum (e.g., none, dry cough; clear, white, yellow, green)     Tinted yellow  4. HEMOPTYSIS: Are you coughing up any blood? If Yes, ask: How much? (e.g., flecks, streaks, tablespoons, etc.)     Denies 5. DIFFICULTY BREATHING: Are you having difficulty breathing? If Yes, ask: How bad is it? (e.g., mild, moderate, severe)      Shortness of breath  6. FEVER: Do you have a fever? If Yes, ask: What is your temperature, how was it measured, and when did it start?     Denies  7. CARDIAC HISTORY: Do you have any  history of heart disease? (e.g., heart attack, congestive heart failure)       8. LUNG HISTORY: Do you have any history of lung disease?  (e.g., pulmonary embolus, asthma, emphysema)      9. PE RISK FACTORS: Do you have a history of blood clots? (or: recent major surgery, recent prolonged travel, bedridden)      10. OTHER SYMPTOMS: Do you have any other symptoms? (e.g., runny nose, wheezing, chest pain)       Headache, cough painful  Protocols used: Cough - Acute Productive-A-AH

## 2024-03-29 NOTE — Telephone Encounter (Signed)
 Aware, will watch for correspondence Agree with UC disposition

## 2024-03-30 ENCOUNTER — Emergency Department

## 2024-03-30 ENCOUNTER — Other Ambulatory Visit: Payer: Self-pay

## 2024-03-30 ENCOUNTER — Emergency Department: Admission: EM | Admit: 2024-03-30 | Discharge: 2024-03-30 | Disposition: A | Discharge status: Home / Self Care

## 2024-03-30 DIAGNOSIS — J101 Influenza due to other identified influenza virus with other respiratory manifestations: Secondary | ICD-10-CM | POA: Diagnosis not present

## 2024-03-30 DIAGNOSIS — J111 Influenza due to unidentified influenza virus with other respiratory manifestations: Secondary | ICD-10-CM

## 2024-03-30 DIAGNOSIS — R059 Cough, unspecified: Secondary | ICD-10-CM | POA: Diagnosis present

## 2024-03-30 LAB — CBC WITH DIFFERENTIAL/PLATELET
Abs Immature Granulocytes: 0.01 K/uL (ref 0.00–0.07)
Basophils Absolute: 0 K/uL (ref 0.0–0.1)
Basophils Relative: 1 %
Eosinophils Absolute: 0 K/uL (ref 0.0–0.5)
Eosinophils Relative: 0 %
HCT: 36.1 % (ref 36.0–46.0)
Hemoglobin: 12.7 g/dL (ref 12.0–15.0)
Immature Granulocytes: 0 %
Lymphocytes Relative: 9 %
Lymphs Abs: 0.3 K/uL — ABNORMAL LOW (ref 0.7–4.0)
MCH: 30.8 pg (ref 26.0–34.0)
MCHC: 35.2 g/dL (ref 30.0–36.0)
MCV: 87.6 fL (ref 80.0–100.0)
Monocytes Absolute: 0.3 K/uL (ref 0.1–1.0)
Monocytes Relative: 8 %
Neutro Abs: 3.3 K/uL (ref 1.7–7.7)
Neutrophils Relative %: 82 %
Platelets: 241 K/uL (ref 150–400)
RBC: 4.12 MIL/uL (ref 3.87–5.11)
RDW: 12.9 % (ref 11.5–15.5)
WBC: 4 K/uL (ref 4.0–10.5)
nRBC: 0 % (ref 0.0–0.2)

## 2024-03-30 LAB — COMPREHENSIVE METABOLIC PANEL WITH GFR
ALT: 10 U/L (ref 0–44)
AST: 23 U/L (ref 15–41)
Albumin: 4.7 g/dL (ref 3.5–5.0)
Alkaline Phosphatase: 69 U/L (ref 38–126)
Anion gap: 14 (ref 5–15)
BUN: 5 mg/dL — ABNORMAL LOW (ref 6–20)
CO2: 19 mmol/L — ABNORMAL LOW (ref 22–32)
Calcium: 9.2 mg/dL (ref 8.9–10.3)
Chloride: 108 mmol/L (ref 98–111)
Creatinine, Ser: 0.91 mg/dL (ref 0.44–1.00)
GFR, Estimated: >60 mL/min
Glucose, Bld: 130 mg/dL — ABNORMAL HIGH (ref 70–99)
Potassium: 3.8 mmol/L (ref 3.5–5.1)
Sodium: 141 mmol/L (ref 135–145)
Total Bilirubin: 0.3 mg/dL (ref 0.0–1.2)
Total Protein: 7.6 g/dL (ref 6.5–8.1)

## 2024-03-30 LAB — RESP PANEL BY RT-PCR (RSV, FLU A&B, COVID)  RVPGX2
Influenza A by PCR: NEGATIVE
Influenza B by PCR: POSITIVE — AB
Resp Syncytial Virus by PCR: NEGATIVE
SARS Coronavirus 2 by RT PCR: NEGATIVE

## 2024-03-30 MED ORDER — AZITHROMYCIN 500 MG PO TABS
500.0000 mg | ORAL_TABLET | Freq: Once | ORAL | Status: AC
  Administered 2024-03-30: 500 mg via ORAL
  Filled 2024-03-30: qty 1

## 2024-03-30 MED ORDER — LACTATED RINGERS IV BOLUS
1000.0000 mL | Freq: Once | INTRAVENOUS | Status: AC
  Administered 2024-03-30: 1000 mL via INTRAVENOUS

## 2024-03-30 MED ORDER — AZITHROMYCIN 250 MG PO TABS: ORAL_TABLET | ORAL | 0 refills | Status: AC

## 2024-03-30 MED ORDER — ALBUTEROL SULFATE HFA 108 (90 BASE) MCG/ACT IN AERS: 2.0000 | INHALATION_SPRAY | RESPIRATORY_TRACT | 1 refills | Status: AC | PRN

## 2024-03-30 MED ORDER — ACETAMINOPHEN 500 MG PO TABS
1000.0000 mg | ORAL_TABLET | Freq: Once | ORAL | Status: AC
  Administered 2024-03-30: 1000 mg via ORAL
  Filled 2024-03-30: qty 2

## 2024-03-30 MED ORDER — PREDNISONE 20 MG PO TABS
60.0000 mg | ORAL_TABLET | Freq: Once | ORAL | Status: AC
  Administered 2024-03-30: 60 mg via ORAL
  Filled 2024-03-30: qty 3

## 2024-03-30 MED ORDER — METHYLPREDNISOLONE SODIUM SUCC 125 MG IJ SOLR
125.0000 mg | Freq: Once | INTRAMUSCULAR | Status: AC
  Administered 2024-03-30: 125 mg via INTRAVENOUS
  Filled 2024-03-30: qty 2

## 2024-03-30 MED ORDER — IPRATROPIUM-ALBUTEROL 0.5-2.5 (3) MG/3ML IN SOLN
3.0000 mL | Freq: Once | RESPIRATORY_TRACT | Status: AC
  Administered 2024-03-30: 3 mL via RESPIRATORY_TRACT
  Filled 2024-03-30: qty 3

## 2024-03-30 MED ORDER — PREDNISONE 10 MG PO TABS: 50.0000 mg | ORAL_TABLET | Freq: Every day | ORAL | 0 refills | Status: AC

## 2024-03-30 MED ORDER — KETOROLAC TROMETHAMINE 30 MG/ML IJ SOLN
15.0000 mg | Freq: Once | INTRAMUSCULAR | Status: AC
  Administered 2024-03-30: 15 mg via INTRAVENOUS
  Filled 2024-03-30: qty 1

## 2024-03-30 NOTE — ED Notes (Signed)
 Labs drawn with IV placement in case ordered.

## 2024-03-30 NOTE — Discharge Instructions (Addendum)
 Please call and schedule follow-up appointment with your primary care provider next week if at all possible.  Take the medications as prescribed.  If your symptoms change or get worse over the holiday, please return to the emergency department.

## 2024-03-30 NOTE — ED Triage Notes (Signed)
 Cough since Saturday and SOB. Also chills.  No Ibuprofen  or tylenol  today, just cough medicine.  AAOx3. Skin warm and dry. No SOB/ DOE . NAD

## 2024-03-30 NOTE — ED Provider Notes (Signed)
 "  [facility] Provider Note    Event Date/Time   First MD Initiated Contact with Patient 03/30/24 1507     (approximate)   History   No chief complaint on file.   HPI  Caitlyn Harris is a 43 y.o. female  history of anxiety, MDD, migraine, seizure, GERD and as listed in EMR presents to the emergency department for treatment and evaluation of cough, chills for the past 3 days with some shortness of breath today.  Children at home sick as well.   Physical Exam    Vitals:   03/30/24 1930 03/30/24 1932  BP: 101/84   Pulse: (!) 105   Resp: 16   Temp:  98.8 F (37.1 C)  SpO2: 98%     General: Awake, no distress.  CV:  Good peripheral perfusion.  Resp:  Normal effort.  Breath sounds diminished throughout with faint expiratory wheeze in bilateral bases Abd:  No distention.  Other:     ED Results / Procedures / Treatments   Labs (all labs ordered are listed, but only abnormal results are displayed)  Labs Reviewed  RESP PANEL BY RT-PCR (RSV, FLU A&B, COVID)  RVPGX2 - Abnormal; Notable for the following components:      Result Value   Influenza B by PCR POSITIVE (*)    All other components within normal limits  CBC WITH DIFFERENTIAL/PLATELET - Abnormal; Notable for the following components:   Lymphs Abs 0.3 (*)    All other components within normal limits  COMPREHENSIVE METABOLIC PANEL WITH GFR - Abnormal; Notable for the following components:   CO2 19 (*)    Glucose, Bld 130 (*)    BUN 5 (*)    All other components within normal limits     EKG  Sinus tachycardia with a rate of 112.   RADIOLOGY  Image and radiology report reviewed and interpreted by me. Radiology report consistent with the same.  Chest x-ray shows faint bibasilar densities which may represent atelectasis or atypical infiltrate.  PROCEDURES:  Critical Care performed: No  Procedures   MEDICATIONS ORDERED IN ED:  Medications  ipratropium-albuterol  (DUONEB)  0.5-2.5 (3) MG/3ML nebulizer solution 3 mL (3 mLs Nebulization Given 03/30/24 1552)  methylPREDNISolone  sodium succinate (SOLU-MEDROL ) 125 mg/2 mL injection 125 mg (125 mg Intravenous Given 03/30/24 1552)  lactated ringers  bolus 1,000 mL (0 mLs Intravenous Stopped 03/30/24 1929)  acetaminophen  (TYLENOL ) tablet 1,000 mg (1,000 mg Oral Given 03/30/24 1642)  ketorolac  (TORADOL ) 30 MG/ML injection 15 mg (15 mg Intravenous Given 03/30/24 1642)  azithromycin  (ZITHROMAX ) tablet 500 mg (500 mg Oral Given 03/30/24 1855)  predniSONE  (DELTASONE ) tablet 60 mg (60 mg Oral Given 03/30/24 1855)     IMPRESSION / MDM / ASSESSMENT AND PLAN / ED COURSE   I have reviewed the triage note and vital signs. Vital signs tachycardic   Differential diagnosis includes, but is not limited to, COVID, influenza, RSV, pneumonia, acute viral syndrome, PE  Patient's presentation is most consistent with acute presentation with potential threat to life or bodily function.  43 year old female presenting to the emergency department for treatment and evaluation of cough since Saturday with shortness of breath, and chills.  See HPI for further details.  Clinical Course as of 03/30/24 2337  Wed Mar 30, 2024  1636 Results of respiratory panel and chest x-ray reviewed with patient. She reports feeling slightly better after breathing treatment. Continues to be tachycardic, however only about of fluid has infused. Also, she is now  febrile. Tylenol  and toradol  ordered. Nurse drew labs with IV stick and CBC/CMP ordered.  [CT]    Clinical Course User Index [CT] Daesia Zylka B, FNP   Heart rate now down to 105 and patient is no longer febrile.  Breath sounds are clear and she appears to feel somewhat better.  Plan will be to discharge her home with prescriptions for albuterol , prednisone , and azithromycin  based on chest x-ray reading and  severity of patient's symptoms.  She was strongly encouraged to follow-up with primary care  for reevaluation next week.  If symptoms change or worsen over the holiday, she was advised to return to the emergency department.  She is comfortable and agreeable to this plan.  FINAL CLINICAL IMPRESSION(S) / ED DIAGNOSES   Final diagnoses:  Influenza B  Bronchitis with influenza     Rx / DC Orders   ED Discharge Orders          Ordered    azithromycin  (ZITHROMAX ) 250 MG tablet        03/30/24 1855    predniSONE  (DELTASONE ) 10 MG tablet  Daily        03/30/24 1855    albuterol  (VENTOLIN  HFA) 108 (90 Base) MCG/ACT inhaler  Every 4 hours PRN        03/30/24 1855             Note:  This document was prepared using Dragon voice recognition software and may include unintentional dictation errors.   Caitlyn Kirk NOVAK, FNP 03/30/24 2337    Caitlyn Rossie HERO, MD 04/16/24 [phone]  "

## 2024-04-09 ENCOUNTER — Other Ambulatory Visit: Payer: Self-pay | Admitting: Family Medicine

## 2024-04-09 DIAGNOSIS — G43709 Chronic migraine without aura, not intractable, without status migrainosus: Secondary | ICD-10-CM

## 2024-04-12 NOTE — Telephone Encounter (Signed)
 Requesting: Topamax    Last Visit: 03/08/2024 Next Visit: Visit date not found Last Refill: 10/05/2023   Please Advise

## 2024-04-18 ENCOUNTER — Other Ambulatory Visit: Payer: Self-pay | Admitting: *Deleted

## 2024-04-18 DIAGNOSIS — N83202 Unspecified ovarian cyst, left side: Secondary | ICD-10-CM

## 2024-04-18 MED ORDER — NORETHIN-ETH ESTRAD-FE BIPHAS 1 MG-10 MCG / 10 MCG PO TABS: 1.0000 | ORAL_TABLET | Freq: Every day | ORAL | 3 refills | Status: AC

## 2024-05-09 ENCOUNTER — Ambulatory Visit: Admitting: Neurology

## 2024-05-12 ENCOUNTER — Other Ambulatory Visit: Payer: Self-pay | Admitting: Neurology

## 2024-05-12 DIAGNOSIS — G44229 Chronic tension-type headache, not intractable: Secondary | ICD-10-CM

## 2024-06-21 ENCOUNTER — Telehealth: Admitting: Neurology
